# Patient Record
Sex: Female | Born: 1935 | Race: White | Hispanic: No | Smoking: Never smoker
Health system: Southern US, Community
[De-identification: ages and names within clinical notes are randomized; demographics above are authoritative.]

## PROBLEM LIST (undated history)

## (undated) DIAGNOSIS — C801 Malignant (primary) neoplasm, unspecified: Secondary | ICD-10-CM

## (undated) DIAGNOSIS — F0390 Unspecified dementia without behavioral disturbance: Secondary | ICD-10-CM

## (undated) DIAGNOSIS — M1990 Unspecified osteoarthritis, unspecified site: Secondary | ICD-10-CM

## (undated) DIAGNOSIS — K623 Rectal prolapse: Principal | ICD-10-CM

## (undated) HISTORY — PX: ABDOMINAL HYSTERECTOMY: SHX81

## (undated) HISTORY — PX: MASTECTOMY: SHX3

## (undated) HISTORY — DX: Unspecified osteoarthritis, unspecified site: M19.90

## (undated) HISTORY — PX: APPENDECTOMY: SHX54

## (undated) HISTORY — PX: OTHER SURGICAL HISTORY: SHX169

---

## 2006-11-15 ENCOUNTER — Encounter: Admission: RE | Admit: 2006-11-15 | Discharge: 2006-11-15 | Payer: Self-pay | Admitting: Internal Medicine

## 2006-12-24 ENCOUNTER — Encounter: Admission: RE | Admit: 2006-12-24 | Discharge: 2006-12-24 | Payer: Self-pay | Admitting: Internal Medicine

## 2007-01-14 ENCOUNTER — Encounter: Admission: RE | Admit: 2007-01-14 | Discharge: 2007-01-14 | Payer: Self-pay | Admitting: Internal Medicine

## 2008-02-27 ENCOUNTER — Encounter: Admission: RE | Admit: 2008-02-27 | Discharge: 2008-02-27 | Payer: Self-pay | Admitting: Internal Medicine

## 2009-02-27 ENCOUNTER — Encounter: Admission: RE | Admit: 2009-02-27 | Discharge: 2009-02-27 | Payer: Self-pay | Admitting: Internal Medicine

## 2009-03-14 ENCOUNTER — Encounter: Admission: RE | Admit: 2009-03-14 | Discharge: 2009-04-10 | Payer: Self-pay | Admitting: Internal Medicine

## 2010-02-28 ENCOUNTER — Encounter: Admission: RE | Admit: 2010-02-28 | Discharge: 2010-02-28 | Payer: Self-pay | Admitting: Internal Medicine

## 2010-07-20 ENCOUNTER — Encounter: Payer: Self-pay | Admitting: Internal Medicine

## 2011-03-24 ENCOUNTER — Other Ambulatory Visit: Payer: Self-pay | Admitting: *Deleted

## 2011-03-24 DIAGNOSIS — Z1231 Encounter for screening mammogram for malignant neoplasm of breast: Secondary | ICD-10-CM

## 2011-03-24 DIAGNOSIS — Z9010 Acquired absence of unspecified breast and nipple: Secondary | ICD-10-CM

## 2011-04-15 ENCOUNTER — Ambulatory Visit: Payer: Self-pay

## 2011-04-30 ENCOUNTER — Ambulatory Visit: Payer: Self-pay

## 2011-05-04 ENCOUNTER — Other Ambulatory Visit: Payer: Self-pay | Admitting: Internal Medicine

## 2011-05-04 ENCOUNTER — Ambulatory Visit
Admission: RE | Admit: 2011-05-04 | Discharge: 2011-05-04 | Disposition: A | Discharge status: Home / Self Care | Payer: Medicare Other | Source: Ambulatory Visit | Attending: *Deleted | Admitting: *Deleted

## 2011-05-04 DIAGNOSIS — Z9010 Acquired absence of unspecified breast and nipple: Secondary | ICD-10-CM

## 2011-05-04 DIAGNOSIS — Z1231 Encounter for screening mammogram for malignant neoplasm of breast: Secondary | ICD-10-CM

## 2011-06-24 ENCOUNTER — Ambulatory Visit (INDEPENDENT_AMBULATORY_CARE_PROVIDER_SITE_OTHER): Payer: Medicare Other

## 2011-06-24 DIAGNOSIS — K5900 Constipation, unspecified: Secondary | ICD-10-CM

## 2011-06-24 DIAGNOSIS — K625 Hemorrhage of anus and rectum: Secondary | ICD-10-CM

## 2011-06-24 DIAGNOSIS — K623 Rectal prolapse: Secondary | ICD-10-CM

## 2012-08-29 ENCOUNTER — Other Ambulatory Visit: Payer: Self-pay

## 2012-08-29 DIAGNOSIS — Z853 Personal history of malignant neoplasm of breast: Secondary | ICD-10-CM

## 2012-10-03 ENCOUNTER — Ambulatory Visit
Admission: RE | Admit: 2012-10-03 | Discharge: 2012-10-03 | Disposition: A | Discharge status: Home / Self Care | Payer: Medicare Other | Source: Ambulatory Visit

## 2012-10-03 DIAGNOSIS — Z9012 Acquired absence of left breast and nipple: Secondary | ICD-10-CM

## 2012-10-03 DIAGNOSIS — Z853 Personal history of malignant neoplasm of breast: Secondary | ICD-10-CM

## 2013-06-27 ENCOUNTER — Encounter (HOSPITAL_COMMUNITY): Payer: Self-pay | Admitting: Emergency Medicine

## 2013-06-27 ENCOUNTER — Inpatient Hospital Stay (HOSPITAL_COMMUNITY)
Admission: EM | Admit: 2013-06-27 | Discharge: 2013-07-09 | DRG: 331 | Disposition: A | Discharge status: Home Health | Payer: Medicare Other | Attending: Internal Medicine | Admitting: Internal Medicine

## 2013-06-27 ENCOUNTER — Observation Stay (HOSPITAL_COMMUNITY): Payer: Medicare Other

## 2013-06-27 DIAGNOSIS — I959 Hypotension, unspecified: Secondary | ICD-10-CM | POA: Diagnosis not present

## 2013-06-27 DIAGNOSIS — F3289 Other specified depressive episodes: Secondary | ICD-10-CM | POA: Diagnosis present

## 2013-06-27 DIAGNOSIS — K625 Hemorrhage of anus and rectum: Secondary | ICD-10-CM

## 2013-06-27 DIAGNOSIS — I469 Cardiac arrest, cause unspecified: Secondary | ICD-10-CM

## 2013-06-27 DIAGNOSIS — K5730 Diverticulosis of large intestine without perforation or abscess without bleeding: Secondary | ICD-10-CM | POA: Diagnosis present

## 2013-06-27 DIAGNOSIS — E785 Hyperlipidemia, unspecified: Secondary | ICD-10-CM

## 2013-06-27 DIAGNOSIS — D126 Benign neoplasm of colon, unspecified: Secondary | ICD-10-CM | POA: Diagnosis present

## 2013-06-27 DIAGNOSIS — I10 Essential (primary) hypertension: Secondary | ICD-10-CM | POA: Diagnosis present

## 2013-06-27 DIAGNOSIS — Z7982 Long term (current) use of aspirin: Secondary | ICD-10-CM

## 2013-06-27 DIAGNOSIS — K922 Gastrointestinal hemorrhage, unspecified: Secondary | ICD-10-CM

## 2013-06-27 DIAGNOSIS — D649 Anemia, unspecified: Secondary | ICD-10-CM | POA: Diagnosis present

## 2013-06-27 DIAGNOSIS — I498 Other specified cardiac arrhythmias: Secondary | ICD-10-CM | POA: Diagnosis present

## 2013-06-27 DIAGNOSIS — R55 Syncope and collapse: Secondary | ICD-10-CM

## 2013-06-27 DIAGNOSIS — F0390 Unspecified dementia without behavioral disturbance: Secondary | ICD-10-CM

## 2013-06-27 DIAGNOSIS — K429 Umbilical hernia without obstruction or gangrene: Secondary | ICD-10-CM | POA: Diagnosis present

## 2013-06-27 DIAGNOSIS — Z853 Personal history of malignant neoplasm of breast: Secondary | ICD-10-CM

## 2013-06-27 DIAGNOSIS — F411 Generalized anxiety disorder: Secondary | ICD-10-CM | POA: Diagnosis present

## 2013-06-27 DIAGNOSIS — Z79899 Other long term (current) drug therapy: Secondary | ICD-10-CM

## 2013-06-27 DIAGNOSIS — F329 Major depressive disorder, single episode, unspecified: Secondary | ICD-10-CM | POA: Diagnosis present

## 2013-06-27 DIAGNOSIS — Z8249 Family history of ischemic heart disease and other diseases of the circulatory system: Secondary | ICD-10-CM

## 2013-06-27 DIAGNOSIS — E039 Hypothyroidism, unspecified: Secondary | ICD-10-CM

## 2013-06-27 DIAGNOSIS — K623 Rectal prolapse: Principal | ICD-10-CM | POA: Diagnosis present

## 2013-06-27 DIAGNOSIS — G589 Mononeuropathy, unspecified: Secondary | ICD-10-CM | POA: Diagnosis present

## 2013-06-27 DIAGNOSIS — M792 Neuralgia and neuritis, unspecified: Secondary | ICD-10-CM | POA: Diagnosis present

## 2013-06-27 HISTORY — DX: Unspecified dementia, unspecified severity, without behavioral disturbance, psychotic disturbance, mood disturbance, and anxiety: F03.90

## 2013-06-27 HISTORY — DX: Rectal prolapse: K62.3

## 2013-06-27 HISTORY — DX: Malignant (primary) neoplasm, unspecified: C80.1

## 2013-06-27 LAB — CBC WITH DIFFERENTIAL/PLATELET
Basophils Absolute: 0 10*3/uL (ref 0.0–0.1)
Basophils Relative: 1 % (ref 0–1)
Eosinophils Absolute: 0.2 10*3/uL (ref 0.0–0.7)
Eosinophils Relative: 2 % (ref 0–5)
HCT: 34.7 % — ABNORMAL LOW (ref 36.0–46.0)
Hemoglobin: 11.9 g/dL — ABNORMAL LOW (ref 12.0–15.0)
Lymphocytes Relative: 37 % (ref 12–46)
Lymphs Abs: 3.3 10*3/uL (ref 0.7–4.0)
MCH: 31.3 pg (ref 26.0–34.0)
MCHC: 33.5 g/dL (ref 30.0–36.0)
MCV: 93.4 fL (ref 78.0–100.0)
Monocytes Absolute: 0.7 10*3/uL (ref 0.1–1.0)
Monocytes Relative: 5 % (ref 3–12)
Neutro Abs: 3.8 10*3/uL (ref 1.7–7.7)
Neutrophils Relative %: 51 % (ref 43–77)
Neutrophils Relative %: 49 % (ref 43–77)
Platelets: 235 10*3/uL (ref 150–400)
RBC: 3.61 MIL/uL — ABNORMAL LOW (ref 3.87–5.11)
RDW: 13.6 % (ref 11.5–15.5)
WBC: 7.4 10*3/uL (ref 4.0–10.5)
WBC: 7.6 10*3/uL (ref 4.0–10.5)

## 2013-06-27 LAB — COMPREHENSIVE METABOLIC PANEL WITH GFR
ALT: 19 U/L (ref 0–35)
AST: 19 U/L (ref 0–37)
Albumin: 3 g/dL — ABNORMAL LOW (ref 3.5–5.2)
Alkaline Phosphatase: 75 U/L (ref 39–117)
BUN: 15 mg/dL (ref 6–23)
CO2: 30 meq/L (ref 19–32)
Calcium: 8.6 mg/dL (ref 8.4–10.5)
Chloride: 98 meq/L (ref 96–112)
Creatinine, Ser: 0.8 mg/dL (ref 0.50–1.10)
GFR calc Af Amer: 75 mL/min — ABNORMAL LOW
GFR calc non Af Amer: 65 mL/min — ABNORMAL LOW
Glucose, Bld: 131 mg/dL — ABNORMAL HIGH (ref 70–99)
Potassium: 4.6 meq/L (ref 3.7–5.3)
Sodium: 137 meq/L (ref 137–147)
Total Bilirubin: <0.2 mg/dL — ABNORMAL LOW (ref 0.3–1.2)
Total Protein: 5.9 g/dL — ABNORMAL LOW (ref 6.0–8.3)

## 2013-06-27 LAB — LACTIC ACID, PLASMA: Lactic Acid, Venous: 1 mmol/L (ref 0.5–2.2)

## 2013-06-27 LAB — TYPE AND SCREEN
ABO/RH(D): A POS
Antibody Screen: NEGATIVE

## 2013-06-27 LAB — BASIC METABOLIC PANEL
BUN: 14 mg/dL (ref 6–23)
CO2: 30 mEq/L (ref 19–32)
Calcium: 8.4 mg/dL (ref 8.4–10.5)
Chloride: 98 mEq/L (ref 96–112)
Creatinine, Ser: 0.82 mg/dL (ref 0.50–1.10)
Creatinine, Ser: 0.8 mg/dL (ref 0.50–1.10)
GFR calc Af Amer: 75 mL/min — ABNORMAL LOW (ref >90)
GFR calc non Af Amer: 65 mL/min — ABNORMAL LOW (ref >90)
Glucose, Bld: 170 mg/dL — ABNORMAL HIGH (ref 70–99)
Potassium: 4.5 mEq/L (ref 3.7–5.3)

## 2013-06-27 LAB — OCCULT BLOOD, POC DEVICE: Fecal Occult Bld: POSITIVE — AB

## 2013-06-27 LAB — PHOSPHORUS: Phosphorus: 4.1 mg/dL (ref 2.3–4.6)

## 2013-06-27 LAB — CBC
HCT: 35.1 % — ABNORMAL LOW (ref 36.0–46.0)
Hemoglobin: 11.5 g/dL — ABNORMAL LOW (ref 12.0–15.0)
MCH: 30.6 pg (ref 26.0–34.0)
MCHC: 32.8 g/dL (ref 30.0–36.0)
MCV: 93.4 fL (ref 78.0–100.0)
Platelets: 251 10*3/uL (ref 150–400)
RDW: 13.6 % (ref 11.5–15.5)

## 2013-06-27 LAB — MAGNESIUM: Magnesium: 2 mg/dL (ref 1.5–2.5)

## 2013-06-27 LAB — APTT: aPTT: 32 seconds (ref 24–37)

## 2013-06-27 LAB — ABO/RH: ABO/RH(D): A POS

## 2013-06-27 LAB — PROTIME-INR: Prothrombin Time: 12.9 seconds (ref 11.6–15.2)

## 2013-06-27 MED ORDER — SODIUM CHLORIDE 0.9 % IV SOLN
INTRAVENOUS | Status: AC
  Administered 2013-06-27: 16:00:00 via INTRAVENOUS

## 2013-06-27 MED ORDER — OMEGA-3-ACID ETHYL ESTERS 1 G PO CAPS
1.0000 g | ORAL_CAPSULE | Freq: Every day | ORAL | Status: DC
  Administered 2013-06-28 – 2013-07-01 (×3): 1 g via ORAL
  Filled 2013-06-27 (×4): qty 1

## 2013-06-27 MED ORDER — PEG 3350-KCL-NA BICARB-NACL 420 G PO SOLR
4000.0000 mL | Freq: Once | ORAL | Status: AC
  Administered 2013-06-27: 4000 mL via ORAL
  Filled 2013-06-27: qty 4000

## 2013-06-27 MED ORDER — GLUCOSAMINE-CHONDROITIN 500-400 MG PO TABS: 1.0000 | ORAL_TABLET | Freq: Two times a day (BID) | ORAL | Status: DC

## 2013-06-27 MED ORDER — FISH OIL 1000 MG PO CAPS: 1.0000 | ORAL_CAPSULE | Freq: Every day | ORAL | Status: DC

## 2013-06-27 MED ORDER — SIMVASTATIN 10 MG PO TABS
10.0000 mg | ORAL_TABLET | Freq: Every day | ORAL | Status: DC
  Administered 2013-06-28 – 2013-07-01 (×3): 10 mg via ORAL
  Filled 2013-06-27 (×4): qty 1

## 2013-06-27 MED ORDER — CLONAZEPAM 0.5 MG PO TABS
0.5000 mg | ORAL_TABLET | Freq: Every day | ORAL | Status: DC
  Administered 2013-06-28 – 2013-07-01 (×3): 0.5 mg via ORAL
  Filled 2013-06-27 (×3): qty 1

## 2013-06-27 MED ORDER — DONEPEZIL HCL 10 MG PO TABS
10.0000 mg | ORAL_TABLET | Freq: Every day | ORAL | Status: DC
  Administered 2013-06-27 – 2013-06-30 (×4): 10 mg via ORAL
  Filled 2013-06-27 (×6): qty 1

## 2013-06-27 MED ORDER — PANTOPRAZOLE SODIUM 40 MG IV SOLR
40.0000 mg | Freq: Two times a day (BID) | INTRAVENOUS | Status: DC
  Administered 2013-06-27 – 2013-07-02 (×10): 40 mg via INTRAVENOUS
  Filled 2013-06-27 (×13): qty 40

## 2013-06-27 MED ORDER — CITALOPRAM HYDROBROMIDE 20 MG PO TABS
20.0000 mg | ORAL_TABLET | Freq: Every day | ORAL | Status: DC
  Administered 2013-06-27 – 2013-07-01 (×4): 20 mg via ORAL
  Filled 2013-06-27 (×5): qty 1

## 2013-06-27 MED ORDER — CALCIUM CARBONATE-VITAMIN D 500-200 MG-UNIT PO TABS
2.0000 | ORAL_TABLET | Freq: Every day | ORAL | Status: DC
  Administered 2013-06-28 – 2013-07-01 (×3): 2 via ORAL
  Filled 2013-06-27 (×4): qty 2

## 2013-06-27 MED ORDER — ACETAMINOPHEN 325 MG PO TABS: 650.0000 mg | ORAL_TABLET | Freq: Four times a day (QID) | ORAL | Status: DC | PRN

## 2013-06-27 MED ORDER — ONDANSETRON HCL 4 MG/2ML IJ SOLN
4.0000 mg | Freq: Four times a day (QID) | INTRAMUSCULAR | Status: DC | PRN
  Administered 2013-07-05: 4 mg via INTRAVENOUS
  Filled 2013-06-27: qty 2

## 2013-06-27 MED ORDER — MIRTAZAPINE 15 MG PO TABS
15.0000 mg | ORAL_TABLET | Freq: Every day | ORAL | Status: DC
  Administered 2013-06-27 – 2013-06-30 (×4): 15 mg via ORAL
  Filled 2013-06-27 (×6): qty 1

## 2013-06-27 MED ORDER — HYDROCODONE-ACETAMINOPHEN 5-325 MG PO TABS
1.0000 | ORAL_TABLET | ORAL | Status: DC | PRN
  Administered 2013-07-01 – 2013-07-03 (×3): 1 via ORAL
  Administered 2013-07-04: 2 via ORAL
  Administered 2013-07-06: 1 via ORAL
  Filled 2013-06-27 (×2): qty 1
  Filled 2013-06-27: qty 2
  Filled 2013-06-27 (×2): qty 1

## 2013-06-27 MED ORDER — PREGABALIN 50 MG PO CAPS
100.0000 mg | ORAL_CAPSULE | Freq: Every day | ORAL | Status: DC
  Administered 2013-06-27 – 2013-07-01 (×4): 100 mg via ORAL
  Filled 2013-06-27 (×3): qty 1
  Filled 2013-06-27: qty 2

## 2013-06-27 MED ORDER — ACETAMINOPHEN 650 MG RE SUPP: 650.0000 mg | Freq: Four times a day (QID) | RECTAL | Status: DC | PRN

## 2013-06-27 MED ORDER — LORATADINE 10 MG PO TABS
10.0000 mg | ORAL_TABLET | Freq: Every day | ORAL | Status: DC | PRN
  Filled 2013-06-27: qty 1

## 2013-06-27 MED ORDER — ONDANSETRON HCL 4 MG PO TABS: 4.0000 mg | ORAL_TABLET | Freq: Four times a day (QID) | ORAL | Status: DC | PRN

## 2013-06-27 MED ORDER — LEVOTHYROXINE SODIUM 75 MCG PO TABS
112.5000 ug | ORAL_TABLET | Freq: Every day | ORAL | Status: DC
  Administered 2013-06-28 – 2013-07-01 (×4): 112.5 ug via ORAL
  Filled 2013-06-27 (×5): qty 1.5

## 2013-06-27 MED ORDER — MORPHINE SULFATE 2 MG/ML IJ SOLN
1.0000 mg | INTRAMUSCULAR | Status: DC | PRN
  Administered 2013-06-30: 1 mg via INTRAVENOUS
  Filled 2013-06-27: qty 1

## 2013-06-27 MED ORDER — SODIUM CHLORIDE 0.9 % IV BOLUS (SEPSIS)
500.0000 mL | Freq: Once | INTRAVENOUS | Status: AC
  Administered 2013-06-27: 500 mL via INTRAVENOUS

## 2013-06-27 MED ORDER — SODIUM CHLORIDE 0.9 % IV SOLN
INTRAVENOUS | Status: DC
  Administered 2013-06-27 – 2013-06-29 (×6): via INTRAVENOUS

## 2013-06-27 NOTE — H&P (Addendum)
[facility] History and Physical  Erika Whitney WUJ:[id number] DOB: 1924/08/07 DOA: 06/27/2013  Referring physician: ER physician PCP: No primary provider on file.   Chief Complaint: blood in stool  HPI:  79 year old female with a past medical history of hypothyroidism, dyslipidemia, anxiety, rectal prolapse who presented to [facility] ED 06/27/2013 with complaints of blood in stool ongoing for past several weeks without associated lightheadedness or loss of consciousness. Pt did not have previous evaluation with colonoscopy. No associated abdominal pain, nausea or vomiting. She is on aspirin and naproxen. No fever or chills. No chest pain, shortness of breath or palpitations.  Pt was hemodynamically stable on the admission but had visible blood on rectal exam. Her BP was 113/48, HR 46-56, Tmax 98.9 F and O2 saturation of 96%. Hemoglobin was 11.9 on the admission and further blood work was unremarkable.   Assessment and Plan:  Principal Problem:   Lower GI bleed - painless, likely diverticular bleed - appreciate GI consult; evaluated by Dr. Elnoria Howard - plan for colonoscopy in am - hold aspirin and naproxen - continue protonix 40 mg IV Q 12 hours  Active Problems:   Dyslipidemia - continue statin therapy   Hypothyroidism - continue levothyroxine   Anxiety and depression - continue clonazepam and celexa   Dementia - continue aricept   Neuropathic pain - continue lyrica  Radiological Exams on Admission: No results found.   Code Status: Full Family Communication: Pt at bedside Disposition Plan: Admit for further evaluation  Manson Passey, MD  [facility] Pager [phone]  Review of Systems:  Constitutional: Negative for fever, chills and malaise/fatigue. Negative for diaphoresis.  HENT: Negative for hearing loss, ear pain, nosebleeds, congestion, sore throat, neck pain, tinnitus and ear discharge.   Eyes: Negative for blurred vision, double vision, photophobia, pain,  discharge and redness.  Respiratory: Negative for cough, hemoptysis, sputum production, shortness of breath, wheezing and stridor.   Cardiovascular: Negative for chest pain, palpitations, orthopnea, claudication and leg swelling.  Gastrointestinal: per HPI Genitourinary: Negative for dysuria, urgency, frequency, hematuria and flank pain.  Musculoskeletal: Negative for myalgias, back pain, joint pain and falls.  Skin: Negative for itching and rash.  Neurological: Negative for dizziness and weakness. Negative for tingling, tremors, sensory change, speech change, focal weakness, loss of consciousness and headaches.  Endo/Heme/Allergies: Negative for environmental allergies and polydipsia. Does not bruise/bleed easily.  Psychiatric/Behavioral: Negative for suicidal ideas. The patient is not nervous/anxious.      Past Medical History  Diagnosis Date  . Rectal prolapse   . Dementia    No past surgical history on file. Social History:  reports that she has never smoked. She does not have any smokeless tobacco history on file. She reports that she does not drink alcohol. Her drug history is not on file.  Allergies  Allergen Reactions  . Ibuprofen     Makes blood pressure go up    Family History: Family medical history significant for HTN, HLD   Prior to Admission medications   Medication Sig Start Date End Date Taking? Authorizing Provider  aspirin EC 81 MG tablet Take 81 mg by mouth daily.   Yes Historical Provider, MD  calcium-vitamin D (OSCAL WITH D) 500-200 MG-UNIT per tablet Take 2 tablets by mouth daily.   Yes Historical Provider, MD  citalopram (CELEXA) 20 MG tablet Take 20 mg by mouth daily.   Yes Historical Provider, MD  clonazePAM (KLONOPIN) 0.5 MG tablet Take 0.5 mg by mouth daily.   Yes Historical Provider,  MD  donepezil (ARICEPT) 10 MG tablet Take 10 mg by mouth at bedtime.   Yes Historical Provider, MD  glucosamine-chondroitin 500-400 MG tablet Take 1 tablet by mouth 2 (two)  times daily.   Yes Historical Provider, MD  levothyroxine (SYNTHROID, LEVOTHROID) 75 MCG tablet Take 112.5 mcg by mouth daily before breakfast. Takes one and one half tablets to = 112.5mg    Yes Historical Provider, MD  loratadine (CLARITIN) 10 MG tablet Take 10 mg by mouth daily as needed for allergies (allergies).   Yes Historical Provider, MD  mirtazapine (REMERON) 15 MG tablet Take 15 mg by mouth at bedtime.   Yes Historical Provider, MD  Multiple Vitamins-Minerals (MULTIVITAMIN PO) Take 1 tablet by mouth daily.   Yes Historical Provider, MD  naproxen sodium (ANAPROX) 220 MG tablet Take 220 mg by mouth 2 (two) times daily as needed (pain).   Yes Historical Provider, MD  Omega-3 Fatty Acids (FISH OIL) 1000 MG CAPS Take 1 capsule by mouth daily.   Yes Historical Provider, MD  pregabalin (LYRICA) 100 MG capsule Take 100 mg by mouth daily.   Yes Historical Provider, MD  simvastatin (ZOCOR) 10 MG tablet Take 10 mg by mouth daily.   Yes Historical Provider, MD   Physical Exam: Filed Vitals:   06/27/13 1330 06/27/13 1400 06/27/13 1430 06/27/13 1459  BP: 124/46 149/46 130/49   Pulse: 50 47 51   Temp:    98.9 F (37.2 C)  TempSrc:    Oral  Resp: 15 12 15    SpO2: 98% 96% 99%     Physical Exam  Constitutional: Appears well-developed and well-nourished. No distress.  HENT: Normocephalic. External right and left ear normal. Oropharynx is clear and moist.  Eyes: Conjunctivae and EOM are normal. PERRLA, no scleral icterus.  Neck: Normal ROM. Neck supple. No JVD. No tracheal deviation. No thyromegaly.  CVS: RRR, S1/S2 +, no murmurs, no gallops, no carotid bruit.  Pulmonary: Effort and breath sounds normal, no stridor, rhonchi, wheezes, rales.  Abdominal: Soft. BS +,  no distension, tenderness, rebound or guarding.  Musculoskeletal: Normal range of motion. No edema and no tenderness.  Lymphadenopathy: No lymphadenopathy noted, cervical, inguinal. Neuro: Alert. Normal reflexes, muscle tone  coordination. No cranial nerve deficit. Skin: Skin is warm and dry. No rash noted. Not diaphoretic. No erythema. No pallor.  Psychiatric: Normal mood and affect. Behavior, judgment, thought content normal.   Labs on Admission:  Basic Metabolic Panel:  Recent Labs Lab 06/27/13 1218  NA 137  K 4.5  CL 98  CO2 30  GLUCOSE 85  BUN 18  CREATININE 0.82  CALCIUM 8.7   Liver Function Tests: No results found for this basename: AST, ALT, ALKPHOS, BILITOT, PROT, ALBUMIN,  in the last 168 hours No results found for this basename: LIPASE, AMYLASE,  in the last 168 hours No results found for this basename: AMMONIA,  in the last 168 hours CBC:  Recent Labs Lab 06/27/13 1218  WBC 7.4  NEUTROABS 3.8  HGB 11.9*  HCT 34.7*  MCV 92.8  PLT 229   Cardiac Enzymes: No results found for this basename: CKTOTAL, CKMB, CKMBINDEX, TROPONINI,  in the last 168 hours BNP: No components found with this basename: POCBNP,  CBG: No results found for this basename: GLUCAP,  in the last 168 hours  If 7PM-7AM, please contact night-coverage [email or web address] Password [facility] 06/27/2013, 3:38 PM

## 2013-06-27 NOTE — Progress Notes (Addendum)
Atttempted to get report from ED x2.  No answer.

## 2013-06-27 NOTE — Progress Notes (Signed)
PHARMACIST - PHYSICIAN ORDER COMMUNICATION  CONCERNING: P&T Medication Policy on Herbal Medications  DESCRIPTION:  This patient's order for:  Glucosamine-chondroitin  has been noted.  This product(s) is classified as an "herbal" or natural product. Due to a lack of definitive safety studies or FDA approval, nonstandard manufacturing practices, plus the potential risk of unknown drug-drug interactions while on inpatient medications, the Pharmacy and Therapeutics Committee does not permit the use of "herbal" or natural products of this type within [facility].   ACTION TAKEN: The pharmacy department is unable to verify this order at this time and your patient has been informed of this safety policy. Please reevaluate patient's clinical condition at discharge and address if the herbal or natural product(s) should be resumed at that time.  Clance Boll, PharmD, BCPS Pager: [phone] 06/27/2013 5:05 PM

## 2013-06-27 NOTE — Consult Note (Signed)
Reason for Consult: Hematochezia Referring Physician: [facility]  Justine Null HPI: This is an 79 year old female with a PMH of rectal prolapse, minimal dementia, and history of breast cancer who is admitted for hematochezia.  The patient's bleeding started earlier today and she had a total of 3 bouts of bleeding witnessed by family at home and by the ER staff.  Last Thanksgiving she had an issue with rectal prolapse, but this resolved without any issue.  No further reports of prolapse over the past year.  She did have issues with hematochezia for the past several weeks, but she did not tell any of her family members.  This was subsequently discovered by her daughter, which prompted an outpatient consultation.  No complaints of any abdominal pain, nausea, vomiting, fever, and there is no known family history of colon cancer.  She has never had a colonoscopy.    Past Medical History  Diagnosis Date  . Rectal prolapse   . Dementia   . Cancer     BREAST    Past Surgical History  Procedure Laterality Date  . Abdominal hysterectomy    . Cesarean section    . Appendectomy    . Left mastectomy    . Mastectomy      History reviewed. No pertinent family history.  Social History:  reports that she has never smoked. She has never used smokeless tobacco. She reports that she does not drink alcohol or use illicit drugs.  Allergies:  Allergies  Allergen Reactions  . Ibuprofen     Makes blood pressure go up    Medications:  Scheduled: . sodium chloride   Intravenous STAT  . [START ON 06/28/2013] calcium-vitamin D  2 tablet Oral Daily  . citalopram  20 mg Oral Daily  . [START ON 06/28/2013] clonazePAM  0.5 mg Oral Daily  . donepezil  10 mg Oral QHS  . [START ON 06/28/2013] levothyroxine  112.5 mcg Oral QAC breakfast  . mirtazapine  15 mg Oral QHS  . [START ON 06/28/2013] omega-3 acid ethyl esters  1 g Oral Daily  . pantoprazole (PROTONIX) IV  40 mg Intravenous Q12H  .  pregabalin  100 mg Oral Daily  . [START ON 06/28/2013] simvastatin  10 mg Oral Daily   Continuous: . sodium chloride 75 mL/hr at 06/27/13 1723    Results for orders placed during the hospital encounter of 06/27/13 (from the past 24 hour(s))  ABO/RH     Status: None   Collection Time    06/27/13 11:55 AM      Result Value Range   ABO/RH(D) A POS    OCCULT BLOOD, POC DEVICE     Status: Abnormal   Collection Time    06/27/13 12:05 PM      Result Value Range   Fecal Occult Bld POSITIVE (*) NEGATIVE  LACTIC ACID, PLASMA     Status: None   Collection Time    06/27/13 12:17 PM      Result Value Range   Lactic Acid, Venous 1.0  0.5 - 2.2 mmol/L  TYPE AND SCREEN     Status: None   Collection Time    06/27/13 12:17 PM      Result Value Range   ABO/RH(D) A POS     Antibody Screen NEG     Sample Expiration 06/30/2013    CBC WITH DIFFERENTIAL     Status: Abnormal   Collection Time    06/27/13 12:18 PM  Result Value Range   WBC 7.4  4.0 - 10.5 K/uL   RBC 3.74 (*) 3.87 - 5.11 MIL/uL   Hemoglobin 11.9 (*) 12.0 - 15.0 g/dL   HCT 16.1 (*) 09.6 - 04.5 %   MCV 92.8  78.0 - 100.0 fL   MCH 31.8  26.0 - 34.0 pg   MCHC 34.3  30.0 - 36.0 g/dL   RDW 40.9  81.1 - 91.4 %   Platelets 229  150 - 400 K/uL   Neutrophils Relative % 51  43 - 77 %   Neutro Abs 3.8  1.7 - 7.7 K/uL   Lymphocytes Relative 37  12 - 46 %   Lymphs Abs 2.8  0.7 - 4.0 K/uL   Monocytes Relative 10  3 - 12 %   Monocytes Absolute 0.7  0.1 - 1.0 K/uL   Eosinophils Relative 2  0 - 5 %   Eosinophils Absolute 0.2  0.0 - 0.7 K/uL   Basophils Relative 1  0 - 1 %   Basophils Absolute 0.0  0.0 - 0.1 K/uL  BASIC METABOLIC PANEL     Status: Abnormal   Collection Time    06/27/13 12:18 PM      Result Value Range   Sodium 137  137 - 147 mEq/L   Potassium 4.5  3.7 - 5.3 mEq/L   Chloride 98  96 - 112 mEq/L   CO2 30  19 - 32 mEq/L   Glucose, Bld 85  70 - 99 mg/dL   BUN 18  6 - 23 mg/dL   Creatinine, Ser 7.82  0.50 - 1.10 mg/dL    Calcium 8.7  8.4 - 95.6 mg/dL   GFR calc non Af Amer 63 (*) >90 mL/min   GFR calc Af Amer 73 (*) >90 mL/min     No results found.  ROS:  As stated above in the HPI otherwise negative.  Blood pressure 163/84, pulse 53, temperature 97 F (36.1 C), temperature source Oral, resp. rate 20, height 5\' 1"  (1.549 m), weight 113 lb 5.1 oz (51.4 kg), SpO2 96.00%.    PE: Gen: NAD, Alert and Oriented HEENT:  [place]/AT, EOMI Neck: Supple, no LAD Lungs: CTA Bilaterally CV: RRR without M/G/R ABM: Soft, NTND, +BS Ext: No C/C/E,  Rectal: Fresh blood on the examination glove, no masses palpated  Assessment/Plan: 1) Hematochezia. 2) Mild anemia.   The patient's clinical history is consistent with a diverticular bleed.  I will perform further evaluation with a colonoscopy since she has never had the procedure in the past.  She is hemodynamically stable and she denies any problems with chest pain, SOB, or MI.  I was not able to elicit a rectal prolapse during my evaluation.  Plan: 1) Colonoscopy tomorrow AM. 2) Follow HGB and transfuse if necessary.  Lenyx Boody D 06/27/2013, 6:21 PM

## 2013-06-27 NOTE — ED Notes (Signed)
Bed: WA09 Expected date:  Expected time:  Means of arrival:  Comments: Rectal bleed

## 2013-06-27 NOTE — ED Notes (Signed)
Per EMS- pt from home with c/o of rectal bleeding from prolapse rectum. Denies pain.  100cc of bright red blood. 3 inches prolaspe

## 2013-06-27 NOTE — Progress Notes (Addendum)
Shift event: Around change of shift, RN paged this NP secondary to pt having a rectal mass ? Prolapse vs hemorrhoid. Pt here with GIB for colonoscopy tomorrow. NP to bedside. When NP arrived, pt sitting on commode and leaning over dry heaving in trash can. Straining. Brown stool noted at rectum. Rectal prolapse noted with brown stool and dark red blood in commode. Pt then suddenly slid off commode onto knees and help was called. Pt noted to be unresponsive. RN x 2 and this NP carried pt to bed. Pt without pulse. Code blue called. Compressions started immediately. After 30 seconds, pt responsive with pulse and breathing on her own. Code Blue cancelled. Rapid response at bedside. AED pads on pt but not used. Pt mental status back to baseline (hx dementia) immediately. Pt with pallor, responsive now, HR 46 sinus brady. RR normal. BP in 130s. CBG 157.  PCCM called and case info relayed. PCCM agreed that pt likely vasovagaled since event was witnessed and mental status quickly restored as well as pulse. Pt transferred to SDU for closer observation tonight. PCCM to see in consult. This NP called Dr. Hung, GI, and relayed info about rectal prolapse. GI advised reinsertion of rectum to correct prolapse and if any issues, call surgery. This NP asked Dr. Hijazi, of [facility], to accompany me to bedside for procedure. Rectal prolapse easily corrected by Dr. Hijazi.  Dr. Hung wanted rectal tube placement since pt is being prepped for colonoscopy in am.  Tests ordered: Troponin x 3, first one neg.  CBC with stable Hgb at 11.5 and mild leukocytosis. Get CXR. 12 lead EKG stat pending. BMP essentially normal. CBGs stable.  Will continue to follow closely tonight. If medically unstable, may need to postpone colonoscopy tomorrow. Will f/up recs by PCCM doc.  Family updated at bedside. Lemon Whitacre Kirby-Graham, NP [facility] Update: Pt resting comfortably. Skin pink now, warm and dry. Alert, responsive,  appropriate. EKG unremarkable except for sinus brady. CXR neg. VS stable, HR in the 50s now.  KJKG, NP Update: Last H/H 12.6/37.6. HR in the upper 50-60s now. Remains alert and appropriate. Conts to receive prep for colonoscopy with good rectal output.  KJKG, NP 

## 2013-06-27 NOTE — Progress Notes (Signed)
Nurse Practitioner Craige Cotta on-call notified of presumed rectal prolapse.  When nurse practitioner arrived patient was found lying on top of trash can not responding to verbal stimuli.  When patient was lifted from trash can, she was not visibly breathing.  Patient lifted and put onto bed, and was found to not have a pulse.  Code blue was called and chest compressions were immediately started.  After 30 seconds of chest compressions, patient was found to have a pulse, and AED pads were applied.  Patient was transferred to ICU, and report was given to [facility].  Philomena Doheny RN

## 2013-06-27 NOTE — ED Provider Notes (Signed)
CSN: [id number]     Arrival date & time 06/27/13  1130 History   First MD Initiated Contact with Patient 06/27/13 1130     Chief Complaint  Patient presents with  . Rectal Bleeding   (Consider location/radiation/quality/duration/timing/severity/associated sxs/prior Treatment) HPI Comments: 79 yo female with mild memory loss hx presents after large episode of lighter GI bleeding and possible prolapsed rectum.  Prolapse has reduced since.  Hx of similar.  Pt has GI visit planned this week with [facility].  No colonoscopy hx or diverticulitis hx.  No blood thinners except asa.  No afib or abd pain.  Mild general weakness.  Pt feels okay otherwise. One episode.  Pt had one in the past but not as much bleeding.   Patient is a 79 y.o. female presenting with hematochezia. The history is provided by the patient.  Rectal Bleeding Associated symptoms: no abdominal pain, no fever, no light-headedness and no vomiting     Past Medical History  Diagnosis Date  . Rectal prolapse    No past surgical history on file. No family history on file. History  Substance Use Topics  . Smoking status: Never Smoker   . Smokeless tobacco: Not on file  . Alcohol Use: No   OB History   Grav Para Term Preterm Abortions TAB SAB Ect Mult Living                 Review of Systems  Constitutional: Negative for fever and chills.  HENT: Negative for congestion.   Eyes: Negative for visual disturbance.  Respiratory: Negative for shortness of breath.   Cardiovascular: Negative for chest pain.  Gastrointestinal: Positive for blood in stool and hematochezia. Negative for vomiting and abdominal pain.  Genitourinary: Negative for dysuria and flank pain.  Musculoskeletal: Negative for back pain, neck pain and neck stiffness.  Skin: Negative for rash.  Neurological: Negative for light-headedness and headaches.    Allergies  Review of patient's allergies indicates not on file.  Home Medications  No current  outpatient prescriptions on file. There were no vitals taken for this visit. Physical Exam  Nursing note and vitals reviewed. Constitutional: She is oriented to person, place, and time. She appears well-developed and well-nourished.  HENT:  Head: Normocephalic and atraumatic.  Eyes: Conjunctivae are normal. Right eye exhibits no discharge. Left eye exhibits no discharge.  Neck: Normal range of motion. Neck supple. No tracheal deviation present.  Cardiovascular: Normal rate and regular rhythm.   Pulmonary/Chest: Effort normal and breath sounds normal.  Abdominal: Soft. She exhibits no distension. There is no tenderness. There is no guarding.  Genitourinary: Guaiac positive stool (gross lighter blood, no hemorrhoid or prolapse seen).  Musculoskeletal: She exhibits no edema.  Neurological: She is alert and oriented to person, place, and time.  Skin: Skin is warm. No rash noted.  Psychiatric: She has a normal mood and affect.    ED Course  Procedures (including critical care time) Labs Review Labs Reviewed  CBC WITH DIFFERENTIAL - Abnormal; Notable for the following:    RBC 3.74 (*)    Hemoglobin 11.9 (*)    HCT 34.7 (*)    All other components within normal limits  BASIC METABOLIC PANEL - Abnormal; Notable for the following:    GFR calc non Af Amer 63 (*)    GFR calc Af Amer 73 (*)    All other components within normal limits  OCCULT BLOOD, POC DEVICE - Abnormal; Notable for the following:    Fecal Occult Bld  POSITIVE (*)    All other components within normal limits  LACTIC ACID, PLASMA  TYPE AND SCREEN  ABO/RH   Imaging Review No results found.  EKG Interpretation   None       MDM   1. GI bleed   2. Dementia   3. Dyslipidemia   4. Hypothyroidism    GI bleed.  No prolapse seen, likely reduced. Other possibilities diverticular, inner hemorrhoid, angiodysplasia, other.   No bleeding in ED. Pt well appearing on recheck, no pain.  Discussed observation in hospital  with family/ [facility], accepted. Spoke with GI on call for consult.   Plan for labs, monitoring. Gross blood on exam.   The patients results and plan were reviewed and discussed.   Any x-rays performed were personally reviewed by myself.   Differential diagnosis were considered with the presenting HPI.   Admission/ observation were discussed with the admitting physician, patient and/or family and they are comfortable with the plan.    Enid Skeens, MD 06/27/13 [phone]

## 2013-06-27 NOTE — Progress Notes (Signed)
   CARE MANAGEMENT ED NOTE 06/27/2013  Patient:  Erika Whitney, Erika Whitney   Account Number:  [id number]  Date Initiated:  06/27/2013  Documentation initiated by:  Radford Pax  Subjective/Objective Assessment:   Patient presents to ED with rectal bleeding from prolapsed rectum.     Subjective/Objective Assessment Detail:     Action/Plan:   IV fluids and protonix given in ED   Action/Plan Detail:   Anticipated DC Date:       Status Recommendation to Physician:   Result of Recommendation:    Other ED Services  Consult Working Plan    DC Planning Services  Other  PCP issues    Choice offered to / List presented to:            Status of service:  Completed, signed off  ED Comments:   ED Comments Detail:  Patient confirms her pcp is Dr. Rosezetta Schlatter with [facility] in [place].  System updated.  Patient reports she lives at home lone.  Patient has a woman who comes to the house on Tues and Thurs from 9am to 12 noon to help her with her meals.  Patient reports she is able to [facility], dress, and feed herself without difficulty.  Patient does not have any medical equipment at home.  Patient stated, "I don't need it."  Patient's son [name] and daughter in Youth worker at bedside.  As per patient's son, patient's daughter [name] sees the patient every day and helps her with her medications. Patient's son reports that the patient has recently had some memory issues and has difficulty remebering what day of the week it is.

## 2013-06-28 ENCOUNTER — Encounter (HOSPITAL_COMMUNITY): Admission: EM | Disposition: A | Discharge status: Home Health | Payer: Self-pay | Source: Home / Self Care

## 2013-06-28 ENCOUNTER — Encounter (HOSPITAL_COMMUNITY): Payer: Self-pay

## 2013-06-28 DIAGNOSIS — F341 Dysthymic disorder: Secondary | ICD-10-CM

## 2013-06-28 DIAGNOSIS — K623 Rectal prolapse: Secondary | ICD-10-CM

## 2013-06-28 DIAGNOSIS — I519 Heart disease, unspecified: Secondary | ICD-10-CM

## 2013-06-28 DIAGNOSIS — I469 Cardiac arrest, cause unspecified: Secondary | ICD-10-CM

## 2013-06-28 DIAGNOSIS — K625 Hemorrhage of anus and rectum: Secondary | ICD-10-CM

## 2013-06-28 HISTORY — PX: COLONOSCOPY: SHX5424

## 2013-06-28 LAB — CBC
Platelets: 224 10*3/uL (ref 150–400)
RBC: 4.04 MIL/uL (ref 3.87–5.11)
RDW: 13.5 % (ref 11.5–15.5)
WBC: 9.4 10*3/uL (ref 4.0–10.5)

## 2013-06-28 LAB — COMPREHENSIVE METABOLIC PANEL
AST: 28 U/L (ref 0–37)
Alkaline Phosphatase: 84 U/L (ref 39–117)
BUN: 10 mg/dL (ref 6–23)
Calcium: 8.5 mg/dL (ref 8.4–10.5)
Chloride: 102 mEq/L (ref 96–112)
Creatinine, Ser: 0.68 mg/dL (ref 0.50–1.10)
GFR calc Af Amer: 89 mL/min — ABNORMAL LOW (ref >90)
GFR calc non Af Amer: 77 mL/min — ABNORMAL LOW (ref >90)
Glucose, Bld: 104 mg/dL — ABNORMAL HIGH (ref 70–99)
Potassium: 4.1 mEq/L (ref 3.7–5.3)
Sodium: 139 mEq/L (ref 137–147)
Total Bilirubin: 0.2 mg/dL — ABNORMAL LOW (ref 0.3–1.2)

## 2013-06-28 LAB — HEMOGLOBIN AND HEMATOCRIT, BLOOD: HCT: 37.6 % (ref 36.0–46.0)

## 2013-06-28 LAB — TROPONIN I: Troponin I: <0.3 ng/mL (ref <0.30)

## 2013-06-28 SURGERY — COLONOSCOPY
Anesthesia: Moderate Sedation

## 2013-06-28 MED ORDER — MIDAZOLAM HCL 10 MG/2ML IJ SOLN
INTRAMUSCULAR | Status: AC
  Filled 2013-06-28: qty 2

## 2013-06-28 MED ORDER — SODIUM CHLORIDE 0.9 % IV SOLN
INTRAVENOUS | Status: DC
  Administered 2013-06-28: 09:00:00 via INTRAVENOUS

## 2013-06-28 MED ORDER — HYDRALAZINE HCL 20 MG/ML IJ SOLN
10.0000 mg | INTRAMUSCULAR | Status: DC | PRN
  Administered 2013-06-28: 10 mg via INTRAVENOUS
  Filled 2013-06-28 (×2): qty 1

## 2013-06-28 MED ORDER — FENTANYL CITRATE 0.05 MG/ML IJ SOLN
INTRAMUSCULAR | Status: DC | PRN
  Administered 2013-06-28: 25 ug via INTRAVENOUS

## 2013-06-28 MED ORDER — MIDAZOLAM HCL 5 MG/5ML IJ SOLN
INTRAMUSCULAR | Status: DC | PRN
  Administered 2013-06-28 (×2): 2 mg via INTRAVENOUS

## 2013-06-28 MED ORDER — FENTANYL CITRATE 0.05 MG/ML IJ SOLN
INTRAMUSCULAR | Status: AC
  Filled 2013-06-28: qty 2

## 2013-06-28 NOTE — OR Nursing (Signed)
pts b/p 60/26 fluils NS wide open will continue to monitor.

## 2013-06-28 NOTE — Progress Notes (Addendum)
[facility] PROGRESS NOTE  Erika Whitney GEX:[id number] DOB: Nov 04, 1924 DOA: 06/27/2013 PCP: Delorse Lek, MD  Brief summary  79 year old female with a past medical history of hypothyroidism, dyslipidemia, anxiety, rectal prolapse who presented to [facility] ED 06/27/2013 with complaints of blood in stool ongoing for past several weeks without associated lightheadedness or loss of consciousness. Pt did not have previous evaluation with colonoscopy. No associated abdominal pain, nausea or vomiting. She is on aspirin and naproxen. No fever or chills. No chest pain, shortness of breath or palpitations.  Pt was hemodynamically stable on the admission but had visible blood on rectal exam.  She had rectal prolapse while sitting on toilet which likely led to vasovagal syncope and possible asystole vs. Severe bradycardia.  Chest compressions were done for 30 seconds with ROSC and spontaneous breathing.  She did not require intubation, shock, or administration of medications.  Colonoscopy on 12/31 confirmed rectal prolapse.   Assessment/Plan  Rectal prolapse with GI bleed. Appreciate gastroenterology assistance. Colonoscopy verified rectal prolapse on 12/31. -  Away general surgery recommendations -  Continue to hold aspirin and Naprosyn -  Continue IV Protonix -  Continue prn vicodin  Possible cardiac arrest likely due to severe vasovagal syncope and hypotension this morning during procedure -  Minimize medications that will slow AV conduction -  Agree that correcting rectal prolapse may prevent this from happening -  Troponins neg -  ECHo pending -  Cardiology consultation for pre-operative assessment for possible rectopexy on Friday  Elevated blood pressures -  Add prn hydralazine -  Avoid medications which can slow HR/AV node conduction  Dyslipidemia, stable - continue statin therapy  Hypothyroidism,  -  Check TSH   - continue levothyroxine  Anxiety and depression stable - continue  clonazepam and celexa  Dementia stable,  - continue aricept  Neuropathic pain stable - continue low dose lyrica  Diet:  NPO pending surgery evaluation Access:  PIV IVF:  yes Proph:  SCD  Code Status: Full Family Communication: Patient and her son Disposition Plan: Plan for long-term management or correction of rectal prolapse   Consultants:  Gastroenterology, Doctor Elnoria Howard  General surgery  Procedures:  Chest x-ray  Colonoscopy on 12/31  Antibiotics:  None   HPI/Subjective:  Patient states that she feels well. She does not remember the events that occurred overnight. She is aware that she has rectal prolapse on her colonoscopy and is awaiting general surgery to stop by.    She denies nausea, vomiting, diarrhea. She states she feels that she has to go to the bathroom currently.  Objective: Filed Vitals:   06/28/13 0830 06/28/13 0835 06/28/13 0840 06/28/13 0900  BP: 142/59 142/59 130/47 132/58  Pulse:    54  Temp:      TempSrc:      Resp: 60 15 14 16   Height:      Weight:      SpO2: 98% 97% 97% 99%    Intake/Output Summary (Last 24 hours) at 06/28/13 1139 Last data filed at 06/28/13 1100  Gross per 24 hour  Intake   4920 ml  Output   2901 ml  Net   2019 ml   Filed Weights   06/27/13 1711 06/28/13 0313  Weight: 51.4 kg (113 lb 5.1 oz) 52.7 kg (116 lb 2.9 oz)    Exam:   General:  Caucasian female, No acute distress  HEENT:  NCAT, MMM  Cardiovascular:  RRR, nl S1, S2 no mrg, 2+ pulses, warm extremities  Respiratory:  CTAB, no increased WOB  Abdomen:   NABS, soft, ND, mildly tender to palpation in the left lower cautery and and slightly medial to the left lower quadrant  MSK:   Normal tone and bulk, no LEE  Neuro:  Grossly intact  Data Reviewed: Basic Metabolic Panel:  Recent Labs Lab 06/27/13 1218 06/27/13 1740 06/27/13 1940 06/28/13 0345  NA 137 137 136* 139  K 4.5 4.6 4.3 4.1  CL 98 98 98 102  CO2 30 30 25 25   GLUCOSE 85 131* 170*  104*  BUN 18 15 14 10   CREATININE 0.82 0.80 0.80 0.68  CALCIUM 8.7 8.6 8.4 8.5  MG  --  2.0  --   --   PHOS  --  4.1  --   --    Liver Function Tests:  Recent Labs Lab 06/27/13 1740 06/28/13 0345  AST 19 28  ALT 19 21  ALKPHOS 75 84  BILITOT <0.2* 0.2*  PROT 5.9* 6.5  ALBUMIN 3.0* 3.2*   No results found for this basename: LIPASE, AMYLASE,  in the last 168 hours No results found for this basename: AMMONIA,  in the last 168 hours CBC:  Recent Labs Lab 06/27/13 1218 06/27/13 1740 06/27/13 1940 06/28/13 0110 06/28/13 0345  WBC 7.4 7.6 13.1*  --  9.4  NEUTROABS 3.8 3.7  --   --   --   HGB 11.9* 11.3* 11.5* 12.6 12.5  HCT 34.7* 33.7* 35.1* 37.6 37.8  MCV 92.8 93.4 93.4  --  93.6  PLT 229 235 251  --  224   Cardiac Enzymes:  Recent Labs Lab 06/27/13 1940 06/28/13 0110 06/28/13 0927  TROPONINI <0.30 <0.30 <0.30   BNP (last 3 results) No results found for this basename: PROBNP,  in the last 8760 hours CBG:  Recent Labs Lab 06/27/13 1937  GLUCAP 155*    Recent Results (from the past 240 hour(s))  MRSA PCR SCREENING     Status: None   Collection Time    06/27/13  8:17 PM      Result Value Range Status   MRSA by PCR NEGATIVE  NEGATIVE Final   Comment:            The GeneXpert MRSA Assay (FDA     approved for NASAL specimens     only), is one component of a     comprehensive MRSA colonization     surveillance program. It is not     intended to diagnose MRSA     infection nor to guide or     monitor treatment for     MRSA infections.     Studies: Dg Chest Port 1 View  06/27/2013   CLINICAL DATA:  Syncope.  Leukocytosis.  EXAM: PORTABLE CHEST - 1 VIEW  COMPARISON:  None.  FINDINGS: Normal heart size and pulmonary vascularity. Probable emphysematous changes within the lungs with peribronchial thickening and central interstitial changes consistent with chronic bronchitis. No focal airspace disease. No blunting of costophrenic angles. No pneumothorax.   IMPRESSION: No active disease.   Electronically Signed   By: Burman Nieves M.D.   On: 06/27/2013 21:51    Scheduled Meds: . calcium-vitamin D  2 tablet Oral Daily  . citalopram  20 mg Oral Daily  . clonazePAM  0.5 mg Oral Daily  . donepezil  10 mg Oral QHS  . levothyroxine  112.5 mcg Oral QAC breakfast  . mirtazapine  15 mg Oral QHS  . omega-3 acid ethyl  esters  1 g Oral Daily  . pantoprazole (PROTONIX) IV  40 mg Intravenous Q12H  . pregabalin  100 mg Oral Daily  . simvastatin  10 mg Oral Daily   Continuous Infusions: . sodium chloride 100 mL/hr at 06/27/13 2100  . sodium chloride 20 mL/hr at 06/28/13 0908    Principal Problem:   GI bleed Active Problems:   Rectal bleed   Dyslipidemia   Hypothyroidism   Anxiety and depression   Dementia   Neuropathic pain    Time spent: 30 min    Erika Whitney, [facility]  [facility] Pager [phone]. If 7PM-7AM, please contact night-coverage at [email or web address], password [facility] 06/28/2013, 11:39 AM  LOS: 1 day

## 2013-06-28 NOTE — Interval H&P Note (Signed)
History and Physical Interval Note:  06/28/2013 7:27 AM  Erika Whitney  has presented today for surgery, with the diagnosis of Hematochezia  The various methods of treatment have been discussed with the patient and family. After consideration of risks, benefits and other options for treatment, the patient has consented to  Procedure(s): COLONOSCOPY (N/A) as a surgical intervention .  The patient's history has been reviewed, patient examined, no change in status, stable for surgery.  I have reviewed the patient's chart and labs.  Questions were answered to the patient's satisfaction.     Rhianna Raulerson D

## 2013-06-28 NOTE — Evaluation (Signed)
Physical Therapy Evaluation Patient Details Name: Erika Whitney MRN: [id number] DOB: September 11, 1924 Today's Date: 06/28/2013 Time: 3664-4034 PT Time Calculation (min): 24 min  PT Assessment / Plan / Recommendation History of Present Illness  pt was admitted for GI Bleed. She is s/p colonoscopy  Clinical Impression  Pt tolerated ambulation well,  Slightly unsteady initially. Pt will benefit from PT to address problems. Will need to see how pt progresses to determine DC plan as pt lives alone.    PT Assessment  Patient needs continued PT services    Follow Up Recommendations  Home health PT;SNF (epends on  level of function and safety/ caregiver availabilty)    Does the patient have the potential to tolerate intense rehabilitation      Barriers to Discharge Decreased caregiver support      Equipment Recommendations   (tbd)    Recommendations for Other Services     Frequency Min 3X/week    Precautions / Restrictions Precautions Precautions: Fall Restrictions Weight Bearing Restrictions: No   Pertinent Vitals/Pain RN came into room and stated notification of V- tach HR 126. Pt's HR o/w in 50-60 range.      Mobility  Bed Mobility Bed Mobility: Supine to Sit Supine to Sit: 5: Supervision Transfers Transfers: Sit to Stand Sit to Stand: 4: Min guard;From bed;With upper extremity assist Stand to Sit: 5: Supervision;To chair/3-in-1;With upper extremity assist Details for Transfer Assistance: pt moves a bit slower first time standing up. Ambulation/Gait Ambulation/Gait Assistance: 4: Min assist Ambulation Distance (Feet): 15 Feet (then 60') Assistive device: 1 person hand held assist Ambulation/Gait Assistance Details: gait initially unsteady, improved with distance. Gait Pattern: Step-through pattern    Exercises     PT Diagnosis: Generalized weakness  PT Problem List: Decreased strength;Decreased activity tolerance;Decreased mobility PT Treatment Interventions: Gait  training;Functional mobility training;Stair training;Therapeutic activities;Therapeutic exercise;Patient/family education     PT Goals(Current goals can be found in the care plan section) Acute Rehab PT Goals Patient Stated Goal: to et up and walk. PT Goal Formulation: With patient/family Time For Goal Achievement: 07/12/13 Potential to Achieve Goals: Good  Visit Information  Last PT Received On: 06/28/13 Assistance Needed: +1 PT/OT/SLP Co-Evaluation/Treatment: Yes Reason for Co-Treatment: For patient/therapist safety PT goals addressed during session: Mobility/safety with mobility OT goals addressed during session: ADL's and self-care History of Present Illness: pt was admitted for GI Bleed. 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    Cognition  Cognition Arousal/Alertness: Awake/alert Behavior During Therapy: WFL for tasks assessed/performed Overall Cognitive Status: Within Functional Limits for tasks assessed    Extremity/Trunk Assessment Lower Extremity Assessment Lower Extremity Assessment: Generalized weakness Cervical / Trunk Assessment Cervical / Trunk Assessment: Normal   Balance Balance Balance Assessed: Yes  End of Session PT - End of Session Activity Tolerance: Treatment limited secondary to medical complications (Comment) (cut walker short due to HR 126/Vtach per RN notification.) Patient left: in chair;with call bell/phone within reach;with nursing/sitter in room Nurse Communication: Mobility status  GP     Rada Hay 06/28/2013,  2:04 PM Blanchard Kelch PT [phone]

## 2013-06-28 NOTE — Consult Note (Signed)
CARDIOLOGY CONSULT NOTE   Patient ID: Erika Whitney MRN: [id number], DOB/AGE: June 11, 1925   Admit date: 06/27/2013 Date of Consult: 06/28/2013   Primary Physician: Delorse Lek, MD Primary Cardiologist: none  Pt. Profile  PREOPERATIVE RISK ASSESSMENT   Problem List  Past Medical History  Diagnosis Date  . Rectal prolapse   . Dementia   . Cancer     BREAST    Past Surgical History  Procedure Laterality Date  . Abdominal hysterectomy    . Cesarean section    . Appendectomy    . Left mastectomy    . Mastectomy       Allergies  Allergies  Allergen Reactions  . Ibuprofen     Makes blood pressure go up    HPI   79 year old female with a h/o rectal prolapse, minimal dementia, and history of breacer who is admitted for hematochezia secondary to rectal prolapse. The patient's bleeding started earlier today and she had a total of 3 bouts of bleeding witnessed by family at home and by the ER staff. She was admitted and while being in the restroom with another bout of hematochezia she was found lying on top of trash can not responding to verbal stimuli. The patient's pulse  recovered after 30 seconds of chest compression and transferred to ICU. She underwent a colonoscopy yesterday. She is now scheduled a rectal surgery.  The patient states that prior to this admission she was very functional, doing all the house chores without any limitations, no DOE, no chest pain, palpitations or prior syncope. No prior h/o heart disease.    Inpatient Medications  . calcium-vitamin D  2 tablet Oral Daily  . citalopram  20 mg Oral Daily  . clonazePAM  0.5 mg Oral Daily  . donepezil  10 mg Oral QHS  . levothyroxine  112.5 mcg Oral QAC breakfast  . mirtazapine  15 mg Oral QHS  . omega-3 acid ethyl esters  1 g Oral Daily  . pantoprazole (PROTONIX) IV  40 mg Intravenous Q12H  . pregabalin  100 mg Oral Daily  . simvastatin  10 mg Oral Daily    Family History History reviewed.  No pertinent family history.   Social History History   Social History  . Marital Status: Widowed    Spouse Name: N/A    Number of Children: N/A  . Years of Education: N/A   Occupational History  . Not on file.   Social History Main Topics  . Smoking status: Never Smoker   . Smokeless tobacco: Never Used  . Alcohol Use: No  . Drug Use: No  . Sexual Activity: No   Other Topics Concern  . Not on file   Social History Narrative  . No narrative on file     Review of Systems  General:  No chills, fever, night sweats or weight changes.  Cardiovascular:  No chest pain, dyspnea on exertion, edema, orthopnea, palpitations, paroxysmal nocturnal dyspnea. Dermatological: No rash, lesions/masses Respiratory: No cough, dyspnea Urologic: No hematuria, dysuria Abdominal:   No nausea, vomiting, diarrhea, bright red blood per rectum, melena, or hematemesis Neurologic:  No visual changes, wkns, changes in mental status. All other systems reviewed and are otherwise negative except as noted above.  Physical Exam  Blood pressure 177/43, pulse 54, temperature 98.2 F (36.8 C), temperature source Oral, resp. rate 15, height 5\' 1"  (1.549 m), weight 116 lb 2.9 oz (52.7 kg), SpO2 93.00%.  General: Pleasant, NAD Psych: Normal affect. Neuro: Alert  and oriented X 3. Moves all extremities spontaneously. HEENT: Normal  Neck: Supple without bruits or JVD. Lungs:  Resp regular and unlabored, CTA. Heart: RRR no s3, s4, or murmurs. Abdomen: Soft, non-tender, non-distended, BS + x 4.  Extremities: No clubbing, cyanosis or edema. DP/PT/Radials 2+ and equal bilaterally.  Labs   Recent Labs  06/27/13 1940 06/28/13 0110 06/28/13 0927  TROPONINI <0.30 <0.30 <0.30   Lab Results  Component Value Date   WBC 9.4 06/28/2013   HGB 12.5 06/28/2013   HCT 37.8 06/28/2013   MCV 93.6 06/28/2013   PLT 224 06/28/2013    Recent Labs Lab 06/28/13 0345  NA 139  K 4.1  CL 102  CO2 25  BUN 10    CREATININE 0.68  CALCIUM 8.5  PROT 6.5  BILITOT 0.2*  ALKPHOS 84  ALT 21  AST 28  GLUCOSE 104*   No results found for this basename: CHOL, HDL, LDLCALC, TRIG   No results found for this basename: DDIMER   No components found with this basename: POCBNP,   Radiology/Studies  Dg Chest Port 1 View  06/27/2013   CLINICAL DATA:  Syncope.  Leukocytosis.  EXAM: PORTABLE CHEST - 1 VIEW  COMPARISON:  None.  FINDINGS: Normal heart size and pulmonary vascularity. Probable emphysematous changes within the lungs with peribronchial thickening and central interstitial changes consistent with chronic bronchitis. No focal airspace disease. No blunting of costophrenic angles. No pneumothorax.  IMPRESSION: No active disease.   Electronically Signed   By: Burman Nieves M.D.   On: 06/27/2013 21:51   echocardiogram  ECG  Sinus bradycardia, otherwise normal ECG   ASSESSMENT AND PLAN  A very pleasant 79 year old female admitted with hematochesia secondary to rectal prolapse scheduled for a corrective surgery. The patient has no prior cardiac history and was highly functional and asymptomatic prior to this admission.  The episode of lost consciousness yesterday was most probably a vasovagal syncope while straining combined with dehydration associated with acute blood loss. There is no evidence of cardiac arrest, lost pulse might have been just very weak pulse associated with hypotension. Cardiac enzymes are negative and ECG doesn't show any signs of prior infarct or ischemia.  We would recommend to replace fluid prior to the surgery to avoid further hypotension. We would follow echocardiogram results. If normal there should be no contraindication from cardiac standpoint to undergo abdominal/rectal surgery.  Thank you for the consultation, please call us with any questions.   Signed, Lars Masson, MD, [facility] 06/28/2013, 4:35 PM

## 2013-06-28 NOTE — Progress Notes (Signed)
CARE MANAGEMENT NOTE 06/28/2013  Patient:  KARSON, CHICAS   Account Number:  [id number]  Date Initiated:  06/28/2013  Documentation initiated by:  Ryane Konieczny  Subjective/Objective Assessment:   pt with gi bld and hypotension, iv fld support and EGD/poss syncopal episode at home     Action/Plan:   home when stable   Anticipated DC Date:  07/01/2013   Anticipated DC Plan:  HOME/SELF CARE  In-house referral  NA      DC Planning Services  NA      [facility] Choice  NA   Choice offered to / List presented to:  NA   DME arranged  NA      DME agency  NA     HH arranged  NA      HH agency  NA   Status of service:  In process, will continue to follow Medicare Important Message given?  NA - LOS <3 / Initial given by admissions (If response is "NO", the following Medicare IM given date fields will be blank) Date Medicare IM given:   Date Additional Medicare IM given:    Discharge Disposition:    Per UR Regulation:  Reviewed for med. necessity/level of care/duration of stay  If discussed at Long Length of Stay Meetings, dates discussed:    Comments:  [id number]/Demir Titsworth Stark Jock, BSN, [place] [phone] Chart Reviewed for discharge and hospital needs. Discharge needs at time of review:  None present will follow for needs. Review of patient progress due on [id number].

## 2013-06-28 NOTE — Progress Notes (Signed)
Pt taken for procedure. Called son and updates given.

## 2013-06-28 NOTE — Progress Notes (Signed)
Echocardiogram 2D Echocardiogram has been performed.  Mirtha Jain 06/28/2013, 4:35 PM

## 2013-06-28 NOTE — Op Note (Signed)
[facility] [address] [place], [address]   OPERATIVE PROCEDURE REPORT  PATIENT: Erika Whitney, Erika Whitney  MR#: [id number] BIRTHDATE: 1925-03-05  GENDER: Female ENDOSCOPIST: Jeani Hawking, MD ASSISTANT:   Joelyn Oms, OR Technician Anthony Sar, RN PROCEDURE DATE: 06/28/2013 PROCEDURE:   Colonoscopy with snare polypectomy ASA CLASS:   Class III INDICATIONS:Hematochezia. MEDICATIONS: Versed 4 mg IV and Fentanyl 25 mcg IV  DESCRIPTION OF PROCEDURE:   After the risks benefits and alternatives of the procedure were thoroughly explained, informed consent was obtained.  A digital rectal exam revealed no abnormalities of the rectum.    The     endoscope was introduced through the anus  and advanced to the cecum, which was identified by both the appendix and ileocecal valve , No adverse events experienced.    The quality of the prep was excellent. .  The instrument was then slowly withdrawn as the colon was fully examined.   FINDINGS: A 3 mm sessile cecal polyp was removed with a cold snare. Two 3-4 mm sessile transverse polyps were removed with a cold snare.  A 5 mm rectal polyp was removed with a hot snare.  Another 4 mm rectal polyp was removed with a cold snare.  Ten cm from the anal verge there was a clot.  This area was indicative of mucosal injury/ulceration from her rectal prolapse.  A large adherent clot was on top of the area and it was circumfirential.  No concerns for malignancy in this area with closer examination, however, the clot was not able to be removed.  Scattered left sided diverticla were found.          The scope was then withdrawn from the patient and the procedure terminated.  COMPLICATIONS: There were no complications.  IMPRESSION: 1) Rectal prolapse with resultant mucosal ulceration and bleeding. 2) Colonic polyps. 3) Diverticula.  RECOMMENDATIONS: 1) Surgical consultation for rectopexy. 2) Await polyp biopsy results, but with her age,  I do not recommend a repeat colonoscopy.  _______________________________ eSigned:  Jeani Hawking, MD 06/28/2013 8:09 AM

## 2013-06-28 NOTE — Consult Note (Signed)
Reason for Consult:rectal prolapse Referring Physician: Dr Patrick Jupiter  Erika Whitney is an 79 y.o. female.  HPI: patient reports long-standing constipation. Reports progressive rectal prolapse with bowel movements.  She has had one prolapse in the past that had to be reduced by urgent care. She was admitted with prolapse and bleeding.  Today revealed signs of rectal prolapse with no other masses noted within her colon.  She has had two episodes of hypotension requiring cardiovascular support during her hospitalization.   Past Medical History  Diagnosis Date  . Rectal prolapse   . Dementia   . Cancer     BREAST    Past Surgical History  Procedure Laterality Date  . Abdominal hysterectomy    . Cesarean section    . Appendectomy    . Left mastectomy    . Mastectomy      History reviewed. No pertinent family history.  Social History:  reports that she has never smoked. She has never used smokeless tobacco. She reports that she does not drink alcohol or use illicit drugs.  Allergies:  Allergies  Allergen Reactions  . Ibuprofen     Makes blood pressure go up    Medications: I have reviewed the patient's current medications.  Results for orders placed during the hospital encounter of 06/27/13 (from the past 48 hour(s))  ABO/RH     Status: None   Collection Time    06/27/13 11:55 AM      Result Value Range   ABO/RH(D) A POS    OCCULT BLOOD, POC DEVICE     Status: Abnormal   Collection Time    06/27/13 12:05 PM      Result Value Range   Fecal Occult Bld POSITIVE (*) NEGATIVE  LACTIC ACID, PLASMA     Status: None   Collection Time    06/27/13 12:17 PM      Result Value Range   Lactic Acid, Venous 1.0  0.5 - 2.2 mmol/L  TYPE AND SCREEN     Status: None   Collection Time    06/27/13 12:17 PM      Result Value Range   ABO/RH(D) A POS     Antibody Screen NEG     Sample Expiration 06/30/2013    CBC WITH DIFFERENTIAL     Status: Abnormal   Collection Time    06/27/13 12:18  PM      Result Value Range   WBC 7.4  4.0 - 10.5 K/uL   RBC 3.74 (*) 3.87 - 5.11 MIL/uL   Hemoglobin 11.9 (*) 12.0 - 15.0 g/dL   HCT 16.1 (*) 09.6 - 04.5 %   MCV 92.8  78.0 - 100.0 fL   MCH 31.8  26.0 - 34.0 pg   MCHC 34.3  30.0 - 36.0 g/dL   RDW 40.9  81.1 - 91.4 %   Platelets 229  150 - 400 K/uL   Neutrophils Relative % 51  43 - 77 %   Neutro Abs 3.8  1.7 - 7.7 K/uL   Lymphocytes Relative 37  12 - 46 %   Lymphs Abs 2.8  0.7 - 4.0 K/uL   Monocytes Relative 10  3 - 12 %   Monocytes Absolute 0.7  0.1 - 1.0 K/uL   Eosinophils Relative 2  0 - 5 %   Eosinophils Absolute 0.2  0.0 - 0.7 K/uL   Basophils Relative 1  0 - 1 %   Basophils Absolute 0.0  0.0 - 0.1 K/uL  BASIC  METABOLIC PANEL     Status: Abnormal   Collection Time    06/27/13 12:18 PM      Result Value Range   Sodium 137  137 - 147 mEq/L   Comment: Please note change in reference range.   Potassium 4.5  3.7 - 5.3 mEq/L   Comment: Please note change in reference range.   Chloride 98  96 - 112 mEq/L   CO2 30  19 - 32 mEq/L   Glucose, Bld 85  70 - 99 mg/dL   BUN 18  6 - 23 mg/dL   Creatinine, Ser 1.61  0.50 - 1.10 mg/dL   Calcium 8.7  8.4 - 09.6 mg/dL   GFR calc non Af Amer 63 (*) >90 mL/min   GFR calc Af Amer 73 (*) >90 mL/min   Comment: (NOTE)     The eGFR has been calculated using the CKD EPI equation.     This calculation has not been validated in all clinical situations.     eGFR's persistently <90 mL/min signify possible Chronic Kidney     Disease.  COMPREHENSIVE METABOLIC PANEL     Status: Abnormal   Collection Time    06/27/13  5:40 PM      Result Value Range   Sodium 137  137 - 147 mEq/L   Comment: Please note change in reference range.   Potassium 4.6  3.7 - 5.3 mEq/L   Comment: Please note change in reference range.   Chloride 98  96 - 112 mEq/L   CO2 30  19 - 32 mEq/L   Glucose, Bld 131 (*) 70 - 99 mg/dL   BUN 15  6 - 23 mg/dL   Creatinine, Ser 0.45  0.50 - 1.10 mg/dL   Calcium 8.6  8.4 - 40.9 mg/dL    Total Protein 5.9 (*) 6.0 - 8.3 g/dL   Albumin 3.0 (*) 3.5 - 5.2 g/dL   AST 19  0 - 37 U/L   ALT 19  0 - 35 U/L   Alkaline Phosphatase 75  39 - 117 U/L   Total Bilirubin <0.2 (*) 0.3 - 1.2 mg/dL   GFR calc non Af Amer 65 (*) >90 mL/min   GFR calc Af Amer 75 (*) >90 mL/min   Comment: (NOTE)     The eGFR has been calculated using the CKD EPI equation.     This calculation has not been validated in all clinical situations.     eGFR's persistently <90 mL/min signify possible Chronic Kidney     Disease.  MAGNESIUM     Status: None   Collection Time    06/27/13  5:40 PM      Result Value Range   Magnesium 2.0  1.5 - 2.5 mg/dL  PHOSPHORUS     Status: None   Collection Time    06/27/13  5:40 PM      Result Value Range   Phosphorus 4.1  2.3 - 4.6 mg/dL  CBC WITH DIFFERENTIAL     Status: Abnormal   Collection Time    06/27/13  5:40 PM      Result Value Range   WBC 7.6  4.0 - 10.5 K/uL   RBC 3.61 (*) 3.87 - 5.11 MIL/uL   Hemoglobin 11.3 (*) 12.0 - 15.0 g/dL   HCT 81.1 (*) 91.4 - 78.2 %   MCV 93.4  78.0 - 100.0 fL   MCH 31.3  26.0 - 34.0 pg   MCHC 33.5  30.0 - 36.0  g/dL   RDW 40.9  81.1 - 91.4 %   Platelets 235  150 - 400 K/uL   Neutrophils Relative % 49  43 - 77 %   Neutro Abs 3.7  1.7 - 7.7 K/uL   Lymphocytes Relative 44  12 - 46 %   Lymphs Abs 3.3  0.7 - 4.0 K/uL   Monocytes Relative 5  3 - 12 %   Monocytes Absolute 0.4  0.1 - 1.0 K/uL   Eosinophils Relative 2  0 - 5 %   Eosinophils Absolute 0.2  0.0 - 0.7 K/uL   Basophils Relative 1  0 - 1 %   Basophils Absolute 0.0  0.0 - 0.1 K/uL  APTT     Status: None   Collection Time    06/27/13  5:40 PM      Result Value Range   aPTT 32  24 - 37 seconds  PROTIME-INR     Status: None   Collection Time    06/27/13  5:40 PM      Result Value Range   Prothrombin Time 12.9  11.6 - 15.2 seconds   INR 0.99  0.00 - 1.49  GLUCOSE, CAPILLARY     Status: Abnormal   Collection Time    06/27/13  7:37 PM      Result Value Range    Glucose-Capillary 155 (*) 70 - 99 mg/dL   Comment 1 Notify RN    CBC     Status: Abnormal   Collection Time    06/27/13  7:40 PM      Result Value Range   WBC 13.1 (*) 4.0 - 10.5 K/uL   RBC 3.76 (*) 3.87 - 5.11 MIL/uL   Hemoglobin 11.5 (*) 12.0 - 15.0 g/dL   HCT 78.2 (*) 95.6 - 21.3 %   MCV 93.4  78.0 - 100.0 fL   MCH 30.6  26.0 - 34.0 pg   MCHC 32.8  30.0 - 36.0 g/dL   RDW 08.6  57.8 - 46.9 %   Platelets 251  150 - 400 K/uL  BASIC METABOLIC PANEL     Status: Abnormal   Collection Time    06/27/13  7:40 PM      Result Value Range   Sodium 136 (*) 137 - 147 mEq/L   Comment: Please note change in reference range.   Potassium 4.3  3.7 - 5.3 mEq/L   Comment: Please note change in reference range.   Chloride 98  96 - 112 mEq/L   CO2 25  19 - 32 mEq/L   Glucose, Bld 170 (*) 70 - 99 mg/dL   BUN 14  6 - 23 mg/dL   Creatinine, Ser 6.29  0.50 - 1.10 mg/dL   Calcium 8.4  8.4 - 52.8 mg/dL   GFR calc non Af Amer 65 (*) >90 mL/min   GFR calc Af Amer 75 (*) >90 mL/min   Comment: (NOTE)     The eGFR has been calculated using the CKD EPI equation.     This calculation has not been validated in all clinical situations.     eGFR's persistently <90 mL/min signify possible Chronic Kidney     Disease.  TROPONIN I     Status: None   Collection Time    06/27/13  7:40 PM      Result Value Range   Troponin I <0.30  <0.30 ng/mL   Comment:            Due to the release  kinetics of cTnI,     a negative result within the first hours     of the onset of symptoms does not rule out     myocardial infarction with certainty.     If myocardial infarction is still suspected,     repeat the test at appropriate intervals.  MRSA PCR SCREENING     Status: None   Collection Time    06/27/13  8:17 PM      Result Value Range   MRSA by PCR NEGATIVE  NEGATIVE   Comment:            The GeneXpert MRSA Assay (FDA     approved for NASAL specimens     only), is one component of a     comprehensive MRSA  colonization     surveillance program. It is not     intended to diagnose MRSA     infection nor to guide or     monitor treatment for     MRSA infections.  TROPONIN I     Status: None   Collection Time    06/28/13  1:10 AM      Result Value Range   Troponin I <0.30  <0.30 ng/mL   Comment:            Due to the release kinetics of cTnI,     a negative result within the first hours     of the onset of symptoms does not rule out     myocardial infarction with certainty.     If myocardial infarction is still suspected,     repeat the test at appropriate intervals.  HEMOGLOBIN AND HEMATOCRIT, BLOOD     Status: None   Collection Time    06/28/13  1:10 AM      Result Value Range   Hemoglobin 12.6  12.0 - 15.0 g/dL   HCT 46.9  62.9 - 52.8 %  COMPREHENSIVE METABOLIC PANEL     Status: Abnormal   Collection Time    06/28/13  3:45 AM      Result Value Range   Sodium 139  137 - 147 mEq/L   Comment: Please note change in reference range.   Potassium 4.1  3.7 - 5.3 mEq/L   Comment: Please note change in reference range.   Chloride 102  96 - 112 mEq/L   CO2 25  19 - 32 mEq/L   Glucose, Bld 104 (*) 70 - 99 mg/dL   BUN 10  6 - 23 mg/dL   Creatinine, Ser 4.13  0.50 - 1.10 mg/dL   Calcium 8.5  8.4 - 24.4 mg/dL   Total Protein 6.5  6.0 - 8.3 g/dL   Albumin 3.2 (*) 3.5 - 5.2 g/dL   AST 28  0 - 37 U/L   Comment: SLIGHT HEMOLYSIS   ALT 21  0 - 35 U/L   Alkaline Phosphatase 84  39 - 117 U/L   Total Bilirubin 0.2 (*) 0.3 - 1.2 mg/dL   GFR calc non Af Amer 77 (*) >90 mL/min   GFR calc Af Amer 89 (*) >90 mL/min   Comment: (NOTE)     The eGFR has been calculated using the CKD EPI equation.     This calculation has not been validated in all clinical situations.     eGFR's persistently <90 mL/min signify possible Chronic Kidney     Disease.  CBC     Status: None   Collection Time  06/28/13  3:45 AM      Result Value Range   WBC 9.4  4.0 - 10.5 K/uL   RBC 4.04  3.87 - 5.11 MIL/uL    Hemoglobin 12.5  12.0 - 15.0 g/dL   HCT 78.4  69.6 - 29.5 %   MCV 93.6  78.0 - 100.0 fL   MCH 30.9  26.0 - 34.0 pg   MCHC 33.1  30.0 - 36.0 g/dL   RDW 28.4  13.2 - 44.0 %   Platelets 224  150 - 400 K/uL  TROPONIN I     Status: None   Collection Time    06/28/13  9:27 AM      Result Value Range   Troponin I <0.30  <0.30 ng/mL   Comment:            Due to the release kinetics of cTnI,     a negative result within the first hours     of the onset of symptoms does not rule out     myocardial infarction with certainty.     If myocardial infarction is still suspected,     repeat the test at appropriate intervals.    Dg Chest Port 1 View  06/27/2013   CLINICAL DATA:  Syncope.  Leukocytosis.  EXAM: PORTABLE CHEST - 1 VIEW  COMPARISON:  None.  FINDINGS: Normal heart size and pulmonary vascularity. Probable emphysematous changes within the lungs with peribronchial thickening and central interstitial changes consistent with chronic bronchitis. No focal airspace disease. No blunting of costophrenic angles. No pneumothorax.  IMPRESSION: No active disease.   Electronically Signed   By: Burman Nieves M.D.   On: 06/27/2013 21:51    Review of Systems  Constitutional: Negative for fever and chills.  Respiratory: Negative for cough and shortness of breath.   Cardiovascular: Negative for chest pain.  Gastrointestinal: Positive for constipation and blood in stool. Negative for nausea, vomiting and abdominal pain.  Genitourinary: Negative for dysuria, urgency and frequency.  Skin: Negative for rash.  Neurological: Negative for headaches.   Blood pressure 177/43, pulse 54, temperature 97.3 F (36.3 C), temperature source Oral, resp. rate 15, height 5\' 1"  (1.549 m), weight 116 lb 2.9 oz (52.7 kg), SpO2 93.00%. Physical Exam  Constitutional: She is oriented to person, place, and time. She appears well-developed and well-nourished. No distress.  HENT:  Head: Normocephalic and atraumatic.  Eyes:  Conjunctivae are normal. Pupils are equal, round, and reactive to light.  Neck: Normal range of motion.  Cardiovascular: Normal rate and regular rhythm.   Respiratory: Breath sounds normal. No respiratory distress.  GI: Soft. She exhibits no distension. There is no tenderness.  Genitourinary:  No visible prolapse  Neurological: She is alert and oriented to person, place, and time.  Skin: Skin is warm and dry. She is not diaphoretic.    Assessment/Plan: Erika Whitney is a pleasant 79 y.o. F with rectal prolapse. This appears to be driven by constipation. I had a long discussion with her and her son this evening discussing the surgical options. Given her sons description of the prolapse, it sounds full thickness. We discussed rectopexy with sigmoidectomy versus rectopexy alone. We discussed that given her constipation rectopexy alone would probably only worse than this. We also discussed peroneal approaches which can sometimes be a better option in patients with medical comorbidities. She is going to be evaluated by cardiology for these episodes of hypotension that she has had in the hospital. I think it is reasonable to  proceed with a surgical procedure on Friday if it is felt that she does not need any further cardiac workup.  Donne Baley C. 06/28/2013, 7:13 PM

## 2013-06-28 NOTE — Progress Notes (Signed)
INITIAL NUTRITION ASSESSMENT  DOCUMENTATION CODES Per approved criteria  -Not Applicable   INTERVENTION: Diet advancement per MD Provide Resource Breeze BID if diet advanced to clear liquid Add Multivitamin with minerals when diet advanced RD to continue to monitor  NUTRITION DIAGNOSIS: Inadequate oral intake related to inability to eat/GI bleed as evidenced by NPO status.   Goal: Pt to meet >/= 90% of their estimated nutrition needs   Monitor:  Diet advancement PO intake Weight Labs  Reason for Assessment: Consult  79 y.o. female  Admitting Dx: GI bleed  ASSESSMENT: 79 year old female with a past medical history of hypothyroidism, dyslipidemia, anxiety, rectal prolapse who presented to [facility] ED 06/27/2013 with complaints of blood in stool ongoing for past several weeks without associated lightheadedness or loss of consciousness. Pt currently NPO. She reports having a good appetite and eating 3 good meals daily PTA. She reports losing weight several months ago due to poor po intake but, her weight has been trending up the past few months as she has been eating much better. Encouraged PO intake once diet is advanced.   Height: Ht Readings from Last 1 Encounters:  06/27/13 5\' 1"  (1.549 m)    Weight: Wt Readings from Last 1 Encounters:  06/28/13 116 lb 2.9 oz (52.7 kg)    Ideal Body Weight: 105 lbs  % Ideal Body Weight: 110%  Wt Readings from Last 10 Encounters:  06/28/13 116 lb 2.9 oz (52.7 kg)  06/28/13 116 lb 2.9 oz (52.7 kg)    Usual Body Weight: Varies per pt  % Usual Body Weight: NA  BMI:  Body mass index is 21.96 kg/(m^2).  Estimated Nutritional Needs: Kcal: 1250-1450 Protein: 55-65 grams Fluid: 1.5 L/day  Skin: intact  Diet Order: NPO  EDUCATION NEEDS: -No education needs identified at this time   Intake/Output Summary (Last 24 hours) at 06/28/13 1113 Last data filed at 06/28/13 1100  Gross per 24 hour  Intake   4920 ml  Output   2901 ml   Net   2019 ml    Last BM: 12/31   Labs:   Recent Labs Lab 06/27/13 1218 06/27/13 1740 06/27/13 1940 06/28/13 0345  NA 137 137 136* 139  K 4.5 4.6 4.3 4.1  CL 98 98 98 102  CO2 30 30 25 25   BUN 18 15 14 10   CREATININE 0.82 0.80 0.80 0.68  CALCIUM 8.7 8.6 8.4 8.5  MG  --  2.0  --   --   PHOS  --  4.1  --   --   GLUCOSE 85 131* 170* 104*    CBG (last 3)   Recent Labs  06/27/13 1937  GLUCAP 155*    Scheduled Meds: . calcium-vitamin D  2 tablet Oral Daily  . citalopram  20 mg Oral Daily  . clonazePAM  0.5 mg Oral Daily  . donepezil  10 mg Oral QHS  . levothyroxine  112.5 mcg Oral QAC breakfast  . mirtazapine  15 mg Oral QHS  . omega-3 acid ethyl esters  1 g Oral Daily  . pantoprazole (PROTONIX) IV  40 mg Intravenous Q12H  . pregabalin  100 mg Oral Daily  . simvastatin  10 mg Oral Daily    Continuous Infusions: . sodium chloride 100 mL/hr at 06/27/13 2100  . sodium chloride 20 mL/hr at 06/28/13 6045    Past Medical History  Diagnosis Date  . Rectal prolapse   . Dementia   . Cancer  BREAST    Past Surgical History  Procedure Laterality Date  . Abdominal hysterectomy    . Cesarean section    . Appendectomy    . Left mastectomy    . Mastectomy      Ian Malkin RD, LDN Inpatient Clinical Dietitian Pager: [phone] After Hours Pager: [phone]

## 2013-06-28 NOTE — Evaluation (Signed)
Occupational Therapy Evaluation Patient Details Name: Erika Whitney MRN: [id number] DOB: [birth year] Today's Date: 06/28/2013 Time: 4098-1191 OT Time Calculation (min): 24 min  OT Assessment / Plan / Recommendation History of present illness pt was admitted for GI Bleed. She is s/p colonoscopy   Clinical Impression   Pt was admitted for the above.  At baseline, she is independent with all adls/iadls but she doesn't drive.  She is overall min A currently.  Pt will benefit from skilled OT to increase independence with adls.  Goals in acute are for supervision level overall.      OT Assessment  Patient needs continued OT Services    Follow Up Recommendations  Home health OT;SNF (vs.  Pt lives alone)    Barriers to Discharge      Equipment Recommendations   (to be further assessed:  pt has none)    Recommendations for Other Services    Frequency  Min 2X/week    Precautions / Restrictions Precautions Precautions: Fall Restrictions Weight Bearing Restrictions: No   Pertinent Vitals/Pain HR 50s initially up to 126 and VTACH with ambulation.  Sats 99-100% on RA.  BP 163/45    ADL  Grooming: Brushing hair;Set up Where Assessed - Grooming: Unsupported sitting Upper Body Bathing: Set up Where Assessed - Upper Body Bathing: Unsupported sitting Lower Body Bathing: Minimal assistance Where Assessed - Lower Body Bathing: Supported sit to stand Upper Body Dressing: Minimal assistance (lines) Where Assessed - Upper Body Dressing: Unsupported sitting Lower Body Dressing: Minimal assistance Where Assessed - Lower Body Dressing: Supported sit to stand Toilet Transfer: Minimal assistance Toilet Transfer Method: Sit to Barista:  (bed to recliner) Toileting - Architect and Hygiene: Min guard Where Assessed - Toileting Clothing Manipulation and Hygiene: Sit to stand from 3-in-1 or toilet Transfers/Ambulation Related to ADLs: hand held assist for  ambulation ADL Comments: Pt very motivated.      OT Diagnosis: Generalized weakness  OT Problem List: Decreased strength;Decreased activity tolerance;Impaired balance (sitting and/or standing);Pain;Decreased knowledge of use of DME or AE OT Treatment Interventions: Self-care/ADL training;DME and/or AE instruction;Patient/family education;Balance training   OT Goals(Current goals can be found in the care plan section) Acute Rehab OT Goals Patient Stated Goal: to et up and walk. OT Goal Formulation: With patient Time For Goal Achievement: 07/12/13 Potential to Achieve Goals: Good ADL Goals Pt Will Perform Grooming: with supervision;standing Pt Will Perform Lower Body Bathing: with supervision;sit to/from stand Pt Will Perform Lower Body Dressing: with supervision;sit to/from stand Pt Will Transfer to Toilet: with supervision;ambulating;regular height toilet Pt Will Perform Toileting - Clothing Manipulation and hygiene: with supervision;sit to/from stand Pt Will Perform Tub/Shower Transfer: with min guard assist;shower seat;tub bench;ambulating;Tub transfer  Visit Information  Last OT Received On: 06/28/13 Assistance Needed: +1 PT/OT/SLP Co-Evaluation/Treatment: Yes Reason for Co-Treatment: For patient/therapist safety PT goals addressed during session: Mobility/safety with mobility OT goals addressed during session: ADL's and self-care History of Present Illness: pt was admitted for GI Bleed. She is s/p colonoscopy       Prior Functioning     Home Living Family/patient expects to be discharged to:: Private residence Living Arrangements: Alone Type of Home: House Home Access: Stairs to enter [employer] of Steps: 4 wide steps Entrance Stairs-Rails: Right;Left Home Layout: One level Home Equipment: None Prior Function Level of Independence: Independent Comments: someone gets groceries Communication Communication: No difficulties         Vision/Perception      Cognition  Cognition Arousal/Alertness: Awake/alert  Behavior During Therapy: WFL for tasks assessed/performed Overall Cognitive Status: Within Functional Limits for tasks assessed    Extremity/Trunk Assessment Upper Extremity Assessment Upper Extremity Assessment: Overall WFL for tasks assessed Lower Extremity Assessment Lower Extremity Assessment: Generalized weakness Cervical / Trunk Assessment Cervical / Trunk Assessment: Normal     Mobility Bed Mobility Bed Mobility: Supine to Sit Supine to Sit: 5: Supervision Transfers Sit to Stand: 4: Min guard;From bed;With upper extremity assist Stand to Sit: 5: Supervision;To chair/3-in-1;With upper extremity assist Details for Transfer Assistance: pt moves a bit slower first time standing up.     Exercise     Balance Balance Balance Assessed: Yes Static Standing Balance Static Standing - Balance Support: No upper extremity supported Static Standing - Level of Assistance: 5: Stand by assistance Dynamic Standing Balance Dynamic Standing - Balance Support: Right upper extremity supported Dynamic Standing - Level of Assistance: 4: Min assist Dynamic Standing - Comments: ambulation with hand held assist   End of Session OT - End of Session Activity Tolerance: Patient tolerated treatment well Patient left: in chair;with call bell/phone within reach;with family/visitor present  GO     Tine Mabee 06/28/2013, 2:08 PM Marica Otter, OTR/L [phone] 06/28/2013

## 2013-06-28 NOTE — H&P (View-Only) (Signed)
Shift event: Around change of shift, RN paged this NP secondary to pt having a rectal mass ? Prolapse vs hemorrhoid. Pt here with GIB for colonoscopy tomorrow. NP to bedside. When NP arrived, pt sitting on commode and leaning over dry heaving in trash can. Straining. Brown stool noted at rectum. Rectal prolapse noted with brown stool and dark red blood in commode. Pt then suddenly slid off commode onto knees and help was called. Pt noted to be unresponsive. RN x 2 and this NP carried pt to bed. Pt without pulse. Code blue called. Compressions started immediately. After 30 seconds, pt responsive with pulse and breathing on her own. Code Blue cancelled. Rapid response at bedside. AED pads on pt but not used. Pt mental status back to baseline (hx dementia) immediately. Pt with pallor, responsive now, HR 46 sinus brady. RR normal. BP in 130s. CBG 157.  PCCM called and case info relayed. PCCM agreed that pt likely vasovagaled since event was witnessed and mental status quickly restored as well as pulse. Pt transferred to SDU for closer observation tonight. PCCM to see in consult. This NP called Dr. Elnoria Howard, GI, and relayed info about rectal prolapse. GI advised reinsertion of rectum to correct prolapse and if any issues, call surgery. This NP asked Dr. Sharyon Medicus, of [facility], to accompany me to bedside for procedure. Rectal prolapse easily corrected by Dr. Sharyon Medicus.  Dr. Elnoria Howard wanted rectal tube placement since pt is being prepped for colonoscopy in am.  Tests ordered: Troponin x 3, first one neg.  CBC with stable Hgb at 11.5 and mild leukocytosis. Get CXR. 12 lead EKG stat pending. BMP essentially normal. CBGs stable.  Will continue to follow closely tonight. If medically unstable, may need to postpone colonoscopy tomorrow. Will f/up recs by PCCM doc.  Family updated at bedside. Jimmye Norman, NP [facility] Update: Pt resting comfortably. Skin pink now, warm and dry. Alert, responsive,  appropriate. EKG unremarkable except for sinus brady. CXR neg. VS stable, HR in the 50s now.  KJKG, NP Update: Last H/H 12.6/37.6. HR in the upper 50-60s now. Remains alert and appropriate. Conts to receive prep for colonoscopy with good rectal output.  KJKG, NP

## 2013-06-29 DIAGNOSIS — K623 Rectal prolapse: Principal | ICD-10-CM

## 2013-06-29 DIAGNOSIS — R55 Syncope and collapse: Secondary | ICD-10-CM

## 2013-06-29 LAB — BASIC METABOLIC PANEL
BUN: 7 mg/dL (ref 6–23)
CO2: 28 mEq/L (ref 19–32)
Calcium: 8.7 mg/dL (ref 8.4–10.5)
Chloride: 103 mEq/L (ref 96–112)
Creatinine, Ser: 0.8 mg/dL (ref 0.50–1.10)
GFR calc Af Amer: 75 mL/min — ABNORMAL LOW (ref >90)
GFR calc non Af Amer: 65 mL/min — ABNORMAL LOW (ref >90)
Glucose, Bld: 100 mg/dL — ABNORMAL HIGH (ref 70–99)
Potassium: 3.7 mEq/L (ref 3.7–5.3)
Sodium: 139 mEq/L (ref 137–147)

## 2013-06-29 LAB — CBC
HCT: 33.1 % — ABNORMAL LOW (ref 36.0–46.0)
Hemoglobin: 11 g/dL — ABNORMAL LOW (ref 12.0–15.0)
MCH: 30.7 pg (ref 26.0–34.0)
MCHC: 33.2 g/dL (ref 30.0–36.0)
MCV: 92.5 fL (ref 78.0–100.0)
Platelets: 209 10*3/uL (ref 150–400)
RBC: 3.58 MIL/uL — ABNORMAL LOW (ref 3.87–5.11)
RDW: 13.6 % (ref 11.5–15.5)
WBC: 8.3 10*3/uL (ref 4.0–10.5)

## 2013-06-29 LAB — GLUCOSE, CAPILLARY: GLUCOSE-CAPILLARY: 94 mg/dL (ref 70–99)

## 2013-06-29 LAB — TSH: TSH: 1.828 u[IU]/mL (ref 0.350–4.500)

## 2013-06-29 MED ORDER — POLYETHYLENE GLYCOL 3350 17 GM/SCOOP PO POWD
1.0000 | Freq: Once | ORAL | Status: DC
  Filled 2013-06-29 (×2): qty 255

## 2013-06-29 MED ORDER — METRONIDAZOLE 500 MG PO TABS
1000.0000 mg | ORAL_TABLET | ORAL | Status: AC
  Administered 2013-06-29 (×3): 1000 mg via ORAL
  Filled 2013-06-29 (×3): qty 2

## 2013-06-29 MED ORDER — METRONIDAZOLE 500 MG PO TABS: 500.0000 mg | ORAL_TABLET | ORAL | Status: DC

## 2013-06-29 MED ORDER — BISACODYL 5 MG PO TBEC
10.0000 mg | DELAYED_RELEASE_TABLET | Freq: Once | ORAL | Status: AC
  Administered 2013-06-29: 10 mg via ORAL
  Filled 2013-06-29: qty 2

## 2013-06-29 MED ORDER — NEOMYCIN SULFATE 500 MG PO TABS
1000.0000 mg | ORAL_TABLET | ORAL | Status: AC
  Administered 2013-06-29 (×3): 1000 mg via ORAL
  Filled 2013-06-29 (×3): qty 2

## 2013-06-29 NOTE — Progress Notes (Signed)
Patient Name: Erika Whitney Date of Encounter: 06/29/2013  Principal Problem:   GI bleed Active Problems:   Rectal bleed   Dyslipidemia   Hypothyroidism   Anxiety and depression   Dementia   Neuropathic pain   Rectal prolapse   Cardiac arrest   Vasovagal syncope   Length of Stay: 2  SUBJECTIVE  The patient feels well today, no bleeding, palpitations, lightheadedness.   CURRENT MEDS . bisacodyl  10 mg Oral Once  . calcium-vitamin D  2 tablet Oral Daily  . citalopram  20 mg Oral Daily  . clonazePAM  0.5 mg Oral Daily  . donepezil  10 mg Oral QHS  . levothyroxine  112.5 mcg Oral QAC breakfast  . mirtazapine  15 mg Oral QHS  . omega-3 acid ethyl esters  1 g Oral Daily  . pantoprazole (PROTONIX) IV  40 mg Intravenous Q12H  . polyethylene glycol powder  1 Container Oral Once  . pregabalin  100 mg Oral Daily  . simvastatin  10 mg Oral Daily    OBJECTIVE  Filed Vitals:   06/29/13 0416 06/29/13 0500 06/29/13 0600 06/29/13 0800  BP:    167/46  Pulse:  54 55 61  Temp: 97.7 F (36.5 C)   98.1 F (36.7 C)  TempSrc: Oral   Oral  Resp:  19 15 15   Height:      Weight: 111 lb 15.9 oz (50.8 kg)     SpO2:  95% 97% 98%    Intake/Output Summary (Last 24 hours) at 06/29/13 0837 Last data filed at 06/29/13 0700  Gross per 24 hour  Intake   1400 ml  Output   3700 ml  Net  -2300 ml   Filed Weights   06/27/13 1711 06/28/13 0313 06/29/13 0416  Weight: 113 lb 5.1 oz (51.4 kg) 116 lb 2.9 oz (52.7 kg) 111 lb 15.9 oz (50.8 kg)    PHYSICAL EXAM  General: Pleasant, NAD. Neuro: Alert and oriented X 3. Moves all extremities spontaneously. Psych: Normal affect. HEENT:  Normal  Neck: Supple without bruits or JVD. Lungs:  Resp regular and unlabored, CTA. Heart: RRR no s3, s4, or murmurs. Abdomen: Soft, non-tender, non-distended, BS + x 4.  Extremities: No clubbing, cyanosis or edema. DP/PT/Radials 2+ and equal bilaterally.  Accessory Clinical Findings  CBC  Recent  Labs  06/27/13 1218 06/27/13 1740  06/28/13 0345 06/29/13 0330  WBC 7.4 7.6  < > 9.4 8.3  NEUTROABS 3.8 3.7  --   --   --   HGB 11.9* 11.3*  < > 12.5 11.0*  HCT 34.7* 33.7*  < > 37.8 33.1*  MCV 92.8 93.4  < > 93.6 92.5  PLT 229 235  < > 224 209  < > = values in this interval not displayed. Basic Metabolic Panel  Recent Labs  06/27/13 1218 06/27/13 1740  06/28/13 0345 06/29/13 0330  NA 137 137  < > 139 139  K 4.5 4.6  < > 4.1 3.7  CL 98 98  < > 102 103  CO2 30 30  < > 25 28  GLUCOSE 85 131*  < > 104* 100*  BUN 18 15  < > 10 7  CREATININE 0.82 0.80  < > 0.68 0.80  CALCIUM 8.7 8.6  < > 8.5 8.7  MG  --  2.0  --   --   --   PHOS  --  4.1  --   --   --   < > =  values in this interval not displayed. Liver Function Tests  Recent Labs  06/27/13 1740 06/28/13 0345  AST 19 28  ALT 19 21  ALKPHOS 75 84  BILITOT <0.2* 0.2*  PROT 5.9* 6.5  ALBUMIN 3.0* 3.2*   No results found for this basename: LIPASE, AMYLASE,  in the last 72 hours Cardiac Enzymes  Recent Labs  06/27/13 1940 06/28/13 0110 06/28/13 0927  TROPONINI <0.30 <0.30 <0.30    Radiology/Studies  Dg Chest Port 1 View  06/27/2013   CLINICAL DATA:  Syncope.  Leukocytosis.  EXAM: PORTABLE CHEST - 1 VIEW  COMPARISON:  None.  FINDINGS: Normal heart size and pulmonary vascularity. Probable emphysematous changes within the lungs with peribronchial thickening and central interstitial changes consistent with chronic bronchitis. No focal airspace disease. No blunting of costophrenic angles. No pneumothorax.  IMPRESSION: No active disease.   Electronically Signed   By: Lucienne Capers M.D.   On: 06/27/2013 21:51   Echocardiogram 06/28/2013 Study Conclusions  - Left ventricle: The cavity size was normal. Wall thickness was normal. Systolic function was normal. The estimated ejection fraction was in the range of 60% to 65%. Wall motion was normal; there were no regional wall motion abnormalities. Doppler parameters  are consistent with abnormal left ventricular relaxation (grade 1 diastolic dysfunction). - Aortic valve: Poorly visualized. There was no stenosis. - Mitral valve: No significant regurgitation. - Left atrium: The atrium was mildly dilated. - Right ventricle: The cavity size was normal. Systolic function was normal. - Tricuspid valve: Peak RV-RA gradient: 44mm Hg (S). - Pulmonary arteries: PA peak pressure: 16mm Hg (S). - Inferior vena cava: The vessel was normal in size; the respirophasic diameter changes were in the normal range (= 50%); findings are consistent with normal central venous pressure. Impressions:  - Normal LV size and systolic function, EF 01-60%. Normal RV size and systolic function. Borderline pulmonary hypertension. No significant valvular abnormalities.   TELE: telemetry reviewed, the patient is in normal SR, no arrthythmias    ASSESSMENT AND PLAN  A very pleasant 80 year old female admitted with hematochesia secondary to rectal prolapse scheduled for a corrective surgery.  The patient has no prior cardiac history and was highly functional and asymptomatic prior to this admission.  The episode of lost consciousness yesterday was most probably a vasovagal syncope while straining combined with dehydration associated with acute blood loss. There is no evidence of cardiac arrest, lost pulse might have been just very weak pulse associated with hypotension.  Cardiac enzymes are negative and ECG doesn't show any signs of prior infarct or ischemia.  We would recommend to replace fluid prior to the surgery to avoid further hypotension.   There are no arrhythmias on telemetry. Echocardiogram shows normal biventricular function and no wall motion abnormalities.   There is no contraindication from cardiac standpoint for Mrs Bartl to undergo abdominal/rectal surgery.  We will sign off, if you have any questions, please call me at [phone].  Signed, Ena Dawley, H MD, [facility] 06/29/2013

## 2013-06-29 NOTE — Progress Notes (Signed)
[facility] PROGRESS NOTE  Erika Whitney FGH:[id number] DOB: 1925/06/17 DOA: 06/27/2013 PCP: Stephens Shire, MD  Brief summary  80 year old female with a past medical history of hypothyroidism, dyslipidemia, anxiety, rectal prolapse who presented to [facility] ED 06/27/2013 with complaints of blood in stool ongoing for past several weeks without associated lightheadedness or loss of consciousness. Pt did not have previous evaluation with colonoscopy. No associated abdominal pain, nausea or vomiting. She is on aspirin and naproxen. No fever or chills. No chest pain, shortness of breath or palpitations.  Pt was hemodynamically stable on the admission but had visible blood on rectal exam.  She had rectal prolapse while sitting on toilet which likely led to vasovagal syncope and possible asystole vs. Severe bradycardia.  Chest compressions were done for 30 seconds with ROSC and spontaneous breathing.  She did not require intubation, shock, or administration of medications.  Colonoscopy on 12/31 confirmed rectal prolapse.   Assessment/Plan  Rectal prolapse with GI bleed. Appreciate gastroenterology assistance. Colonoscopy verified rectal prolapse on 12/31. -  Appreciate general surgery recommendations -  Anticipate surgery with resection and rectopexy tomorrow morning -  Continue to hold aspirin and Naprosyn -  Continue IV Protonix -  Continue prn vicodin  Severe vasovagal syncope and hypotension due to rectal prolapse -  Minimize medications that will slow AV conduction -  Troponins neg -  Telemetry: Sinus rhythm -  ECHo demonstrates preserved ejection fraction without wall motion abnormalities, grade 1 diastolic dysfunction -  Cardiology has cleared for surgery on Friday  Elevated blood pressures -  Add prn hydralazine -  Avoid medications which can slow HR/AV node conduction  Dyslipidemia, stable - continue statin therapy  Hypothyroidism,  -   TSH 1.828, at goal - continue  levothyroxine  Anxiety and depression stable - continue clonazepam and celexa  Dementia stable,  - continue aricept  Neuropathic pain stable - continue low dose lyrica  Diet:  Clear liquid diet then n.p.o. at midnight Access:  PIV IVF:  yes Proph:  SCD  Code Status: Full Family Communication: Patient and her son Disposition Plan: Plan for long-term management or correction of rectal prolapse   Consultants:  Gastroenterology, Doctor Benson [place]  General surgery  Procedures:  Chest x-ray  Colonoscopy on 12/31  Antibiotics:  None   HPI/Subjective:  Patient states that she feels well.   She denies nausea, vomiting, diarrhea.  She has persistent mild soreness of the LLQ when it is pressed, but not normally.    Objective: Filed Vitals:   06/29/13 0500 06/29/13 0600 06/29/13 0800 06/29/13 1205  BP:   167/46 110/39  Pulse: 54 55 61 53  Temp:   98.1 F (36.7 C) 97.9 F (36.6 C)  TempSrc:   Oral Oral  Resp: 19 15 15 28   Height:      Weight:      SpO2: 95% 97% 98% 97%    Intake/Output Summary (Last 24 hours) at 06/29/13 1524 Last data filed at 06/29/13 1200  Gross per 24 hour  Intake   2365 ml  Output   3200 ml  Net   -835 ml   Filed Weights   06/27/13 1711 06/28/13 0313 06/29/13 0416  Weight: 51.4 kg (113 lb 5.1 oz) 52.7 kg (116 lb 2.9 oz) 50.8 kg (111 lb 15.9 oz)    Exam:   General:  Caucasian female, No acute distress  HEENT:  NCAT, MMM  Cardiovascular:  RRR, nl S1, S2 no mrg, 2+ pulses, warm extremities  Respiratory:  CTAB, no increased WOB  Abdomen:   NABS, soft, ND, mildly tender to palpation in the left lower cautery and and slightly medial to the left lower quadrant, stable from yesterday  MSK:   Normal tone and bulk, no LEE  Neuro:  Grossly intact  Data Reviewed: Basic Metabolic Panel:  Recent Labs Lab 06/27/13 1218 06/27/13 1740 06/27/13 1940 06/28/13 0345 06/29/13 0330  NA 137 137 136* 139 139  K 4.5 4.6 4.3 4.1 3.7  CL 98 98 98  102 103  CO2 30 30 25 25 28   GLUCOSE 85 131* 170* 104* 100*  BUN 18 15 14 10 7   CREATININE 0.82 0.80 0.80 0.68 0.80  CALCIUM 8.7 8.6 8.4 8.5 8.7  MG  --  2.0  --   --   --   PHOS  --  4.1  --   --   --    Liver Function Tests:  Recent Labs Lab 06/27/13 1740 06/28/13 0345  AST 19 28  ALT 19 21  ALKPHOS 75 84  BILITOT <0.2* 0.2*  PROT 5.9* 6.5  ALBUMIN 3.0* 3.2*   No results found for this basename: LIPASE, AMYLASE,  in the last 168 hours No results found for this basename: AMMONIA,  in the last 168 hours CBC:  Recent Labs Lab 06/27/13 1218 06/27/13 1740 06/27/13 1940 06/28/13 0110 06/28/13 0345 06/29/13 0330  WBC 7.4 7.6 13.1*  --  9.4 8.3  NEUTROABS 3.8 3.7  --   --   --   --   HGB 11.9* 11.3* 11.5* 12.6 12.5 11.0*  HCT 34.7* 33.7* 35.1* 37.6 37.8 33.1*  MCV 92.8 93.4 93.4  --  93.6 92.5  PLT 229 235 251  --  224 209   Cardiac Enzymes:  Recent Labs Lab 06/27/13 1940 06/28/13 0110 06/28/13 0927  TROPONINI <0.30 <0.30 <0.30   BNP (last 3 results) No results found for this basename: PROBNP,  in the last 8760 hours CBG:  Recent Labs Lab 06/27/13 1937 06/29/13 0822  GLUCAP 155* 94    Recent Results (from the past 240 hour(s))  MRSA PCR SCREENING     Status: None   Collection Time    06/27/13  8:17 PM      Result Value Range Status   MRSA by PCR NEGATIVE  NEGATIVE Final   Comment:            The GeneXpert MRSA Assay (FDA     approved for NASAL specimens     only), is one component of a     comprehensive MRSA colonization     surveillance program. It is not     intended to diagnose MRSA     infection nor to guide or     monitor treatment for     MRSA infections.     Studies: Dg Chest Port 1 View  06/27/2013   CLINICAL DATA:  Syncope.  Leukocytosis.  EXAM: PORTABLE CHEST - 1 VIEW  COMPARISON:  None.  FINDINGS: Normal heart size and pulmonary vascularity. Probable emphysematous changes within the lungs with peribronchial thickening and central  interstitial changes consistent with chronic bronchitis. No focal airspace disease. No blunting of costophrenic angles. No pneumothorax.  IMPRESSION: No active disease.   Electronically Signed   By: Lucienne Capers M.D.   On: 06/27/2013 21:51    Scheduled Meds: . calcium-vitamin D  2 tablet Oral Daily  . citalopram  20 mg Oral Daily  . clonazePAM  0.5 mg Oral Daily  .  donepezil  10 mg Oral QHS  . levothyroxine  112.5 mcg Oral QAC breakfast  . metroNIDAZOLE  1,000 mg Oral Custom  . mirtazapine  15 mg Oral QHS  . neomycin  1,000 mg Oral Custom  . omega-3 acid ethyl esters  1 g Oral Daily  . pantoprazole (PROTONIX) IV  40 mg Intravenous Q12H  . pregabalin  100 mg Oral Daily  . simvastatin  10 mg Oral Daily   Continuous Infusions: . sodium chloride 50 mL/hr at 06/29/13 [id number]    Principal Problem:   GI bleed Active Problems:   Rectal bleed   Dyslipidemia   Hypothyroidism   Anxiety and depression   Dementia   Neuropathic pain   Rectal prolapse   Cardiac arrest   Vasovagal syncope    Time spent: 30 min    Nina Mondor, [place] [facility] Pager [phone]. If 7PM-7AM, please contact night-coverage at [email or web address], password [facility] 06/29/2013, 3:23 PM  LOS: 2 days

## 2013-06-29 NOTE — Progress Notes (Signed)
GI bleed  Subjective: Feels fine, no bleeding overnight  Objective: Vital signs in last 24 hours: Temp:  [97.3 F (36.3 C)-98.2 F (36.8 C)] 97.7 F (36.5 C) (01/01 0416) Pulse Rate:  [54-65] 55 (01/01 0600) Resp:  [13-60] 15 (01/01 0600) BP: (124-177)/(34-59) 154/51 mmHg (01/01 0400) SpO2:  [93 %-100 %] 97 % (01/01 0600) Weight:  [111 lb 15.9 oz (50.8 kg)] 111 lb 15.9 oz (50.8 kg) (01/01 0416) Last BM Date: 06/28/13  Intake/Output from previous day: 12/31 0701 - 01/01 0700 In: 1400 [P.O.:775; I.V.:605] Out: 3700 [Urine:3700] Intake/Output this shift:    General appearance: alert and cooperative GI: normal findings: soft, non-tender  Lab Results:  Results for orders placed during the hospital encounter of 06/27/13 (from the past 24 hour(s))  TROPONIN I     Status: None   Collection Time    06/28/13  9:27 AM      Result Value Range   Troponin I <0.30  <0.30 ng/mL  BASIC METABOLIC PANEL     Status: Abnormal   Collection Time    06/29/13  3:30 AM      Result Value Range   Sodium 139  137 - 147 mEq/L   Potassium 3.7  3.7 - 5.3 mEq/L   Chloride 103  96 - 112 mEq/L   CO2 28  19 - 32 mEq/L   Glucose, Bld 100 (*) 70 - 99 mg/dL   BUN 7  6 - 23 mg/dL   Creatinine, Ser 0.80  0.50 - 1.10 mg/dL   Calcium 8.7  8.4 - 10.5 mg/dL   GFR calc non Af Amer 65 (*) >90 mL/min   GFR calc Af Amer 75 (*) >90 mL/min  CBC     Status: Abnormal   Collection Time    06/29/13  3:30 AM      Result Value Range   WBC 8.3  4.0 - 10.5 K/uL   RBC 3.58 (*) 3.87 - 5.11 MIL/uL   Hemoglobin 11.0 (*) 12.0 - 15.0 g/dL   HCT 33.1 (*) 36.0 - 46.0 %   MCV 92.5  78.0 - 100.0 fL   MCH 30.7  26.0 - 34.0 pg   MCHC 33.2  30.0 - 36.0 g/dL   RDW 13.6  11.5 - 15.5 %   Platelets 209  150 - 400 K/uL     Studies/Results Radiology     MEDS, Scheduled . bisacodyl  10 mg Oral Once  . calcium-vitamin D  2 tablet Oral Daily  . citalopram  20 mg Oral Daily  . clonazePAM  0.5 mg Oral Daily  . donepezil   10 mg Oral QHS  . levothyroxine  112.5 mcg Oral QAC breakfast  . mirtazapine  15 mg Oral QHS  . omega-3 acid ethyl esters  1 g Oral Daily  . pantoprazole (PROTONIX) IV  40 mg Intravenous Q12H  . polyethylene glycol powder  1 Container Oral Once  . pregabalin  100 mg Oral Daily  . simvastatin  10 mg Oral Daily     Assessment: GI bleed Rectal prolapse  Plan: If echo normal, I think it would be reasonable to proceed to the OR tom.  Will start a Miralax bowel prep today.  Will discuss with family in AM before making any final decisions and posting the case.   Given constipation issues, would favor resection and rectopexy.    LOS: 2 days    Rosario Adie, MD [facility], [place]   06/29/2013 8:15 AM

## 2013-06-30 ENCOUNTER — Encounter (HOSPITAL_COMMUNITY): Payer: Medicare Other | Admitting: Anesthesiology

## 2013-06-30 ENCOUNTER — Inpatient Hospital Stay (HOSPITAL_COMMUNITY): Payer: Medicare Other | Admitting: Anesthesiology

## 2013-06-30 ENCOUNTER — Other Ambulatory Visit (INDEPENDENT_AMBULATORY_CARE_PROVIDER_SITE_OTHER): Payer: Self-pay | Admitting: General Surgery

## 2013-06-30 ENCOUNTER — Encounter (HOSPITAL_COMMUNITY): Admission: EM | Disposition: A | Discharge status: Home Health | Payer: Self-pay | Source: Home / Self Care

## 2013-06-30 ENCOUNTER — Encounter (HOSPITAL_COMMUNITY): Payer: Self-pay | Admitting: Gastroenterology

## 2013-06-30 DIAGNOSIS — K55059 Acute (reversible) ischemia of intestine, part and extent unspecified: Secondary | ICD-10-CM

## 2013-06-30 HISTORY — PX: LAPAROSCOPIC SIGMOID COLECTOMY: SHX5928

## 2013-06-30 LAB — CBC
HEMATOCRIT: 31.2 % — AB (ref 36.0–46.0)
Hemoglobin: 10.6 g/dL — ABNORMAL LOW (ref 12.0–15.0)
MCH: 31.2 pg (ref 26.0–34.0)
MCHC: 34 g/dL (ref 30.0–36.0)
MCV: 91.8 fL (ref 78.0–100.0)
Platelets: 210 10*3/uL (ref 150–400)
RBC: 3.4 MIL/uL — ABNORMAL LOW (ref 3.87–5.11)
RDW: 13.6 % (ref 11.5–15.5)
WBC: 8.7 10*3/uL (ref 4.0–10.5)

## 2013-06-30 LAB — GLUCOSE, CAPILLARY
Glucose-Capillary: 89 mg/dL (ref 70–99)
Glucose-Capillary: 95 mg/dL (ref 70–99)

## 2013-06-30 LAB — BASIC METABOLIC PANEL
BUN: 10 mg/dL (ref 6–23)
CHLORIDE: 103 meq/L (ref 96–112)
CO2: 23 mEq/L (ref 19–32)
Calcium: 8.5 mg/dL (ref 8.4–10.5)
Creatinine, Ser: 0.93 mg/dL (ref 0.50–1.10)
GFR calc Af Amer: 63 mL/min — ABNORMAL LOW (ref >90)
GFR calc non Af Amer: 54 mL/min — ABNORMAL LOW (ref >90)
GLUCOSE: 118 mg/dL — AB (ref 70–99)
Potassium: 3.9 mEq/L (ref 3.7–5.3)
Sodium: 136 mEq/L — ABNORMAL LOW (ref 137–147)

## 2013-06-30 LAB — SURGICAL PCR SCREEN
MRSA, PCR: NEGATIVE
Staphylococcus aureus: NEGATIVE

## 2013-06-30 SURGERY — COLECTOMY, SIGMOID, LAPAROSCOPIC
Anesthesia: General | Site: Abdomen

## 2013-06-30 MED ORDER — BUPIVACAINE-EPINEPHRINE PF 0.25-1:200000 % IJ SOLN
INTRAMUSCULAR | Status: DC | PRN
Start: 2013-06-30 — End: 2013-06-30
  Administered 2013-06-30 (×2): 10 mL

## 2013-06-30 MED ORDER — EPHEDRINE SULFATE 50 MG/ML IJ SOLN
INTRAMUSCULAR | Status: DC | PRN
  Administered 2013-06-30: 10 mg via INTRAVENOUS

## 2013-06-30 MED ORDER — DEXTROSE 5 % IV SOLN
2.0000 g | INTRAVENOUS | Status: AC
  Administered 2013-06-30: 2 g via INTRAVENOUS
  Filled 2013-06-30: qty 2

## 2013-06-30 MED ORDER — ENOXAPARIN SODIUM 40 MG/0.4ML ~~LOC~~ SOLN
40.0000 mg | SUBCUTANEOUS | Status: DC
  Administered 2013-07-01 – 2013-07-09 (×9): 40 mg via SUBCUTANEOUS
  Filled 2013-06-30 (×11): qty 0.4

## 2013-06-30 MED ORDER — ALVIMOPAN 12 MG PO CAPS
12.0000 mg | ORAL_CAPSULE | Freq: Two times a day (BID) | ORAL | Status: DC
  Filled 2013-06-30 (×2): qty 1

## 2013-06-30 MED ORDER — GLYCOPYRROLATE 0.2 MG/ML IJ SOLN
INTRAMUSCULAR | Status: DC | PRN
  Administered 2013-06-30: 0.2 mg via INTRAVENOUS
  Administered 2013-06-30: .4 mg via INTRAVENOUS

## 2013-06-30 MED ORDER — KCL IN DEXTROSE-NACL 20-5-0.45 MEQ/L-%-% IV SOLN
INTRAVENOUS | Status: DC
  Administered 2013-06-30 – 2013-07-03 (×2): via INTRAVENOUS
  Filled 2013-06-30 (×4): qty 1000

## 2013-06-30 MED ORDER — BUPIVACAINE-EPINEPHRINE 0.25% -1:200000 IJ SOLN
INTRAMUSCULAR | Status: AC
  Filled 2013-06-30: qty 1

## 2013-06-30 MED ORDER — FENTANYL CITRATE 0.05 MG/ML IJ SOLN
INTRAMUSCULAR | Status: AC
  Filled 2013-06-30: qty 5

## 2013-06-30 MED ORDER — LIDOCAINE HCL (CARDIAC) 20 MG/ML IV SOLN
INTRAVENOUS | Status: DC | PRN
  Administered 2013-06-30: 50 mg via INTRAVENOUS

## 2013-06-30 MED ORDER — HYDROMORPHONE HCL PF 1 MG/ML IJ SOLN
INTRAMUSCULAR | Status: DC | PRN
  Administered 2013-06-30 (×3): 0.5 mg via INTRAVENOUS

## 2013-06-30 MED ORDER — HYDROMORPHONE HCL PF 2 MG/ML IJ SOLN
INTRAMUSCULAR | Status: AC
  Filled 2013-06-30: qty 1

## 2013-06-30 MED ORDER — PROPOFOL 10 MG/ML IV BOLUS
INTRAVENOUS | Status: AC
  Filled 2013-06-30: qty 20

## 2013-06-30 MED ORDER — LACTATED RINGERS IV SOLN: INTRAVENOUS | Status: DC

## 2013-06-30 MED ORDER — LACTATED RINGERS IV SOLN
INTRAVENOUS | Status: DC | PRN
  Administered 2013-06-30 (×3): via INTRAVENOUS

## 2013-06-30 MED ORDER — GLYCOPYRROLATE 0.2 MG/ML IJ SOLN
INTRAMUSCULAR | Status: AC
  Filled 2013-06-30: qty 2

## 2013-06-30 MED ORDER — FENTANYL CITRATE 0.05 MG/ML IJ SOLN
INTRAMUSCULAR | Status: DC | PRN
  Administered 2013-06-30: 100 ug via INTRAVENOUS
  Administered 2013-06-30: 50 ug via INTRAVENOUS
  Administered 2013-06-30: 100 ug via INTRAVENOUS

## 2013-06-30 MED ORDER — DEXTROSE 5 % IV SOLN
2.0000 g | Freq: Two times a day (BID) | INTRAVENOUS | Status: AC
  Administered 2013-06-30: 2 g via INTRAVENOUS
  Filled 2013-06-30: qty 2

## 2013-06-30 MED ORDER — NEOSTIGMINE METHYLSULFATE 1 MG/ML IJ SOLN
INTRAMUSCULAR | Status: DC | PRN
  Administered 2013-06-30: 3 mg via INTRAVENOUS

## 2013-06-30 MED ORDER — HEPARIN SODIUM (PORCINE) 5000 UNIT/ML IJ SOLN
INTRAMUSCULAR | Status: AC
  Filled 2013-06-30: qty 1

## 2013-06-30 MED ORDER — HYDROMORPHONE HCL PF 1 MG/ML IJ SOLN
0.2500 mg | INTRAMUSCULAR | Status: DC | PRN
  Administered 2013-06-30: 0.5 mg via INTRAVENOUS
  Filled 2013-06-30: qty 1

## 2013-06-30 MED ORDER — HEPARIN SODIUM (PORCINE) 5000 UNIT/ML IJ SOLN
5000.0000 [IU] | Freq: Three times a day (TID) | INTRAMUSCULAR | Status: DC
  Administered 2013-06-30: 5000 [IU] via SUBCUTANEOUS

## 2013-06-30 MED ORDER — ONDANSETRON HCL 4 MG/2ML IJ SOLN
INTRAMUSCULAR | Status: DC | PRN
  Administered 2013-06-30: 4 mg via INTRAVENOUS

## 2013-06-30 MED ORDER — PROPOFOL 10 MG/ML IV BOLUS
INTRAVENOUS | Status: DC | PRN
  Administered 2013-06-30: 70 mg via INTRAVENOUS

## 2013-06-30 MED ORDER — PROMETHAZINE HCL 25 MG/ML IJ SOLN: 6.2500 mg | INTRAMUSCULAR | Status: DC | PRN

## 2013-06-30 MED ORDER — NEOSTIGMINE METHYLSULFATE 1 MG/ML IJ SOLN
INTRAMUSCULAR | Status: AC
  Filled 2013-06-30: qty 10

## 2013-06-30 MED ORDER — ROCURONIUM BROMIDE 100 MG/10ML IV SOLN
INTRAVENOUS | Status: AC
  Filled 2013-06-30: qty 1

## 2013-06-30 MED ORDER — SODIUM CHLORIDE 0.9 % IR SOLN
Status: DC | PRN
  Administered 2013-06-30: 1000 mL

## 2013-06-30 MED ORDER — LACTATED RINGERS IR SOLN
Status: DC | PRN
  Administered 2013-06-30: 1000 mL

## 2013-06-30 MED ORDER — ROCURONIUM BROMIDE 100 MG/10ML IV SOLN
INTRAVENOUS | Status: DC | PRN
  Administered 2013-06-30 (×3): 10 mg via INTRAVENOUS
  Administered 2013-06-30: 40 mg via INTRAVENOUS

## 2013-06-30 MED ORDER — ALVIMOPAN 12 MG PO CAPS
12.0000 mg | ORAL_CAPSULE | Freq: Once | ORAL | Status: AC
Start: 2013-06-30 — End: 2013-06-30
  Administered 2013-06-30: 12 mg via ORAL
  Filled 2013-06-30: qty 1

## 2013-06-30 MED ORDER — PHENYLEPHRINE HCL 10 MG/ML IJ SOLN
INTRAMUSCULAR | Status: AC
  Filled 2013-06-30: qty 2

## 2013-06-30 SURGICAL SUPPLY — 73 items
APPLIER CLIP 5 13 M/L LIGAMAX5 (MISCELLANEOUS)
BLADE EXTENDED COATED 6.5IN (ELECTRODE) ×3 IMPLANT
BLADE HEX COATED 2.75 (ELECTRODE) ×6 IMPLANT
BLADE SURG SZ10 CARB STEEL (BLADE) ×3 IMPLANT
CABLE HIGH FREQUENCY MONO STRZ (ELECTRODE) ×3 IMPLANT
CANISTER SUCTION 2500CC (MISCELLANEOUS) ×3 IMPLANT
CELLS DAT CNTRL 66122 CELL SVR (MISCELLANEOUS) ×1 IMPLANT
CHLORAPREP W/TINT 26ML (MISCELLANEOUS) ×3 IMPLANT
CLIP APPLIE 5 13 M/L LIGAMAX5 (MISCELLANEOUS) IMPLANT
COVER MAYO STAND STRL (DRAPES) ×6 IMPLANT
DECANTER SPIKE VIAL GLASS SM (MISCELLANEOUS) ×3 IMPLANT
DERMABOND ADVANCED (GAUZE/BANDAGES/DRESSINGS) ×2
DERMABOND ADVANCED .7 DNX12 (GAUZE/BANDAGES/DRESSINGS) ×1 IMPLANT
DEVICE TROCAR PUNCTURE CLOSURE (ENDOMECHANICALS) ×3 IMPLANT
DRAIN CHANNEL 19F RND (DRAIN) IMPLANT
DRAIN CHANNEL RND F F (WOUND CARE) ×3 IMPLANT
DRAPE LAPAROSCOPIC ABDOMINAL (DRAPES) ×3 IMPLANT
DRAPE LG THREE QUARTER DISP (DRAPES) ×6 IMPLANT
DRAPE UTILITY 15X26 (DRAPE) ×6 IMPLANT
DRAPE WARM FLUID 44X44 (DRAPE) ×3 IMPLANT
ELECT REM PT RETURN 9FT ADLT (ELECTROSURGICAL) ×3
ELECTRODE REM PT RTRN 9FT ADLT (ELECTROSURGICAL) ×1 IMPLANT
EVACUATOR SILICONE 100CC (DRAIN) ×6 IMPLANT
GLOVE BIO SURGEON STRL SZ 6.5 (GLOVE) ×6 IMPLANT
GLOVE BIO SURGEONS STRL SZ 6.5 (GLOVE) ×3
GLOVE BIOGEL PI IND STRL 7.0 (GLOVE) ×4 IMPLANT
GLOVE BIOGEL PI INDICATOR 7.0 (GLOVE) ×8
GOWN BRE IMP PREV XXLGXLNG (GOWN DISPOSABLE) ×6 IMPLANT
GOWN STRL REIN XL XLG (GOWN DISPOSABLE) ×24 IMPLANT
KIT BASIN OR (CUSTOM PROCEDURE TRAY) ×6 IMPLANT
LEGGING LITHOTOMY PAIR STRL (DRAPES) ×3 IMPLANT
LIGASURE IMPACT 36 18CM CVD LR (INSTRUMENTS) IMPLANT
NS IRRIG 1000ML POUR BTL (IV SOLUTION) ×3 IMPLANT
PENCIL BUTTON HOLSTER BLD 10FT (ELECTRODE) ×9 IMPLANT
RELOAD PROXIMATE 75MM BLUE (ENDOMECHANICALS) ×3 IMPLANT
RTRCTR WOUND ALEXIS 18CM MED (MISCELLANEOUS) ×3
SCISSORS LAP 5X35 DISP (ENDOMECHANICALS) ×3 IMPLANT
SEALER TISSUE G2 CVD JAW 35 (ENDOMECHANICALS) ×1 IMPLANT
SEALER TISSUE G2 CVD JAW 45CM (ENDOMECHANICALS) ×2
SET IRRIG TUBING LAPAROSCOPIC (IRRIGATION / IRRIGATOR) ×3 IMPLANT
SLEEVE XCEL OPT CAN 5 100 (ENDOMECHANICALS) ×6 IMPLANT
SOLUTION ANTI FOG 6CC (MISCELLANEOUS) ×3 IMPLANT
SPONGE GAUZE 4X4 12PLY (GAUZE/BANDAGES/DRESSINGS) ×3 IMPLANT
SPONGE LAP 18X18 X RAY DECT (DISPOSABLE) ×6 IMPLANT
STAPLER CIRC ILS CVD 33MM 37CM (STAPLE) ×3 IMPLANT
STAPLER PROXIMATE 75MM BLUE (STAPLE) ×3 IMPLANT
STAPLER VISISTAT 35W (STAPLE) ×3 IMPLANT
SUCTION POOLE TIP (SUCTIONS) ×3 IMPLANT
SUT ETHILON 2 0 PS N (SUTURE) ×6 IMPLANT
SUT NOVA NAB DX-16 0-1 5-0 T12 (SUTURE) IMPLANT
SUT PDS AB 1 CTX 36 (SUTURE) ×6 IMPLANT
SUT PDS AB 1 TP1 96 (SUTURE) IMPLANT
SUT PROLENE 2 0 BLUE (SUTURE) ×12 IMPLANT
SUT PROLENE 2 0 KS (SUTURE) ×3 IMPLANT
SUT SILK 2 0 (SUTURE) ×2
SUT SILK 2 0 SH CR/8 (SUTURE) ×3 IMPLANT
SUT SILK 2-0 18XBRD TIE 12 (SUTURE) ×1 IMPLANT
SUT SILK 3 0 (SUTURE) ×2
SUT SILK 3 0 SH CR/8 (SUTURE) ×3 IMPLANT
SUT SILK 3-0 18XBRD TIE 12 (SUTURE) ×1 IMPLANT
SUT VIC AB 2-0 SH 18 (SUTURE) ×3 IMPLANT
SUT VIC AB 4-0 PS2 27 (SUTURE) ×6 IMPLANT
SUT VICRYL 2 0 18  UND BR (SUTURE)
SUT VICRYL 2 0 18 UND BR (SUTURE) IMPLANT
SYS LAPSCP GELPORT 120MM (MISCELLANEOUS)
SYSTEM LAPSCP GELPORT 120MM (MISCELLANEOUS) IMPLANT
TOWEL OR 17X26 10 PK STRL BLUE (TOWEL DISPOSABLE) ×9 IMPLANT
TRAY FOLEY CATH 14FRSI W/METER (CATHETERS) ×3 IMPLANT
TRAY LAP CHOLE (CUSTOM PROCEDURE TRAY) ×3 IMPLANT
TROCAR BLADELESS OPT 5 100 (ENDOMECHANICALS) ×3 IMPLANT
TROCAR XCEL BLUNT TIP 100MML (ENDOMECHANICALS) ×3 IMPLANT
TUBING FILTER THERMOFLATOR (ELECTROSURGICAL) ×3 IMPLANT
YANKAUER SUCT BULB TIP 10FT TU (MISCELLANEOUS) ×6 IMPLANT

## 2013-06-30 NOTE — Anesthesia Preprocedure Evaluation (Signed)
Anesthesia Evaluation  Patient identified by MRN, date of birth, ID band Patient awake    Reviewed: Allergy & Precautions, H&P , NPO status , Patient's Chart, lab work & pertinent test results  Airway Mallampati: II TM Distance: <3 FB Neck ROM: Full    Dental no notable dental hx.    Pulmonary neg pulmonary ROS,  breath sounds clear to auscultation  Pulmonary exam normal       Cardiovascular Rhythm:Regular Rate:Bradycardia   Normal LV size and systolic function, EF 32-99%. Normal RV   size and systolic function. Borderline pulmonary   hypertension. No significant valvular abnormalities.    Neuro/Psych Dementia negative neurological ROS  negative psych ROS   GI/Hepatic negative GI ROS, Neg liver ROS,   Endo/Other  Hypothyroidism   Renal/GU negative Renal ROS  negative genitourinary   Musculoskeletal negative musculoskeletal ROS (+)   Abdominal   Peds negative pediatric ROS (+)  Hematology negative hematology ROS (+)   Anesthesia Other Findings   Reproductive/Obstetrics negative OB ROS                           Anesthesia Physical Anesthesia Plan  ASA: III  Anesthesia Plan: General   Post-op Pain Management:    Induction: Intravenous  Airway Management Planned: Oral ETT  Additional Equipment:   Intra-op Plan:   Post-operative Plan: Extubation in OR  Informed Consent: I have reviewed the patients History and Physical, chart, labs and discussed the procedure including the risks, benefits and alternatives for the proposed anesthesia with the patient or authorized representative who has indicated his/her understanding and acceptance.   Dental advisory given  Plan Discussed with: CRNA and Surgeon  Anesthesia Plan Comments:         Anesthesia Quick Evaluation

## 2013-06-30 NOTE — Op Note (Signed)
06/27/2013 - 06/30/2013  1:50 PM  PATIENT:  Erika Whitney  80 y.o. female  Patient Care Team: Stephens Shire, MD as PCP - General (Family Medicine)  PRE-OPERATIVE DIAGNOSIS:  Rectal prolapse   POST-OPERATIVE DIAGNOSIS:  rectal prolapse  PROCEDURE:  LAPAROSCOPIC RECTOPEXY WITH SIGMOID COLECTOMY  SURGEON:  Surgeon(s): Leighton Ruff, MD Adin Hector, MD  ASSISTANT: Johney Maine   ANESTHESIA:   general  EBL:  Total I/O In: 2000 [I.V.:2000] Out: 350 [Urine:350]  DRAINS: (57F) Jackson-Pratt drain(s) with closed bulb suction in the pelvis   SPECIMEN:  Source of Specimen:  rectosigmoid colon  DISPOSITION OF SPECIMEN:  PATHOLOGY  COUNTS:  YES  PLAN OF CARE: pt already admited  PATIENT DISPOSITION:  PACU - hemodynamically stable.  INDICATION: This is an 80 year old female who presented to the hospital with rectal bleeding and rectal prolapse.  Colonoscopy was performed which showed no evidence of tumors or other abnormalities. Rectal bleeding appeared to be caused by the rectal prolapse. Given these symptoms she was evaluated and cleared for surgery. We are here today to perform rectopexy and sigmoid resection to address her prolapse.   OR FINDINGS: Significant redundant rectum and colon  DESCRIPTION: the patient was identified in the preoperative holding area and taken to the OR where they were laid Supine on the operating room table.  Gen. anesthesia was induced without difficulty. SCDs were also noted to be in place prior to the initiation of anesthesia.  The patient was then prepped and draped in the usual sterile fashion.   A surgical timeout was performed indicating the correct patient, procedure, positioning and need for preoperative antibiotics.   I began by making a infraumbilical incision using a scalpel. Dissection was carried down to the fascia using electrocautery. The fascia was elevated with 2 Coker clamps. An umbilical hernia was encountered. The fascia was opened  using a scalpel. A pursestring suture was placed using an 0 Vicryl suture. A Hassan port was placed and secured. The abdomen was insufflated to approximately 15 mm of mercury. Examination of the abdomen revealed no injury upon entry. Evaluated the rest of the abdomen. The patient had a large redundant sigmoid colon. There were some adhesions at midline from her previous surgery that were taken down using the Enseal device. There was a portion of her small bowel that was adherent to the left lateral sidewall. This was taken down using laproscopic scissors.  I then entered the peritoneum and scored this on either side down to the peritoneal reflection.  Identified the left and right ureter and made sure to dissect medially to these.  I then mobilized the rectum posteriorly using mostly blunt dissection. I mobilized the lateral sidewalls using the Enseal device.  Dissection was carried down to the level of the levators. I evaluated the anus digitally. The dissection was complete. Hemostasis was achieved in the pelvis. I then made a vertical lower midline incision using a scalpel through previous scar. I then dissected down to the subcutaneous layers using Bovie electrocautery. The fascia was divided in midline. The peritoneum was entered. The small bowel was packed out of the pelvis. The sigmoid colon was brought out and an appropriate amount was resected using a GIA blue load 60 mm stapler. Once both ends were resected the remaining mesentery was transected using the Enseal device. The artery and vein were clamped with a Kelly clamp and tied with a 2-0 silk suture.  The specimen was then sent to pathology for further examination. I then evaluated  the proximal colon stump. There was a good pulse noted to be palpable at the end of the anastomosis and the staple line was bleeding.  I felt like the anastomosis would be well-perfused.  After this was completed the EEA sizers were brought onto the field. The colon easily  allowed a 33 mm EEA sizer to be introduced. We brought up a 33 mm EEA stapler. A pursestring device was used to place a 2 approaching across the proximal colon. This was secured into place with 3-0 silk sutures. The anvil was placed and the pursestring was tied tightly around this. After this was complete for 2-0 Prolene sutures were placed in the area of the sacral promontory.  After this we created an anastomosis at the level of the sacral promontory approximately 10 cm from the anal verge using the 33 mm EEA stapler. We then tested our anastomosis under water using insufflation. There were no signs of leak. The previously placed Prolene sutures were and tied to the rectal tissue distal to the anastomosis.  This fixed the rectum and colon to the sacrum.  A 19 [place] Blake drain was then placed into the pelvis and wrapped around the anastomosis. The small bowel was then placed back into the abdomen and the packs were removed. We switched to clean instruments. I fixed her umbilical hernia using interrupted 2-0 Vicryl sutures. The fascia was then closed using 2 #1 looped PDS sutures in a running fashion. The skin was then closed using a running 4-0 Vicryl suture for all incisions. A sterile dressing was placed at the lower midline incision. The remaining incisions were closed using Dermabond.  The patient was then awakened from anesthesia sent to the post anesthesia care unit in stable condition. All counts were correct per operating room staff.

## 2013-06-30 NOTE — Anesthesia Postprocedure Evaluation (Signed)
  Anesthesia Post-op Note  Patient: Erika Whitney  Procedure(s) Performed: Procedure(s) (LRB): LAPAROSCOPIC RECTOPEXY WITH SIGMOID COLECTOMY (N/A)  Patient Location: PACU  Anesthesia Type: General  Level of Consciousness: awake and alert   Airway and Oxygen Therapy: Patient Spontanous Breathing  Post-op Pain: mild  Post-op Assessment: Post-op Vital signs reviewed, Patient's Cardiovascular Status Stable, Respiratory Function Stable, Patent Airway and No signs of Nausea or vomiting  Last Vitals:  Filed Vitals:   06/30/13 1445  BP: 121/48  Pulse: 72  Temp: 34.7 C  Resp: 12    Post-op Vital Signs: stable   Complications: No apparent anesthesia complications

## 2013-06-30 NOTE — Progress Notes (Signed)
GI bleed  Subjective: Pt with rectal prolapse.  Cardiology states no contraindication to undergo anesthesia.    Objective: Vital signs in last 24 hours: Temp:  [97.6 F (36.4 C)-98.3 F (36.8 C)] 98.3 F (36.8 C) (01/02 0800) Pulse Rate:  [53-59] 58 (01/02 0710) Resp:  [13-28] 16 (01/02 0710) BP: (98-172)/(30-57) 117/39 mmHg (01/02 0710) SpO2:  [95 %-100 %] 96 % (01/02 0710) Weight:  [111 lb 1.8 oz (50.4 kg)] 111 lb 1.8 oz (50.4 kg) (01/02 0000) Last BM Date: 06/29/13  Intake/Output from previous day: 01/01 0701 - 01/02 0700 In: 2130 [P.O.:1080; I.V.:1050] Out: 2200 [Urine:2200] Intake/Output this shift:    General appearance: alert and cooperative GI: normal findings: soft, non-tender Skin: lower midline abd scar  Lab Results:  Results for orders placed during the hospital encounter of 06/27/13 (from the past 24 hour(s))  BASIC METABOLIC PANEL     Status: Abnormal   Collection Time    06/30/13  3:40 AM      Result Value Range   Sodium 136 (*) 137 - 147 mEq/L   Potassium 3.9  3.7 - 5.3 mEq/L   Chloride 103  96 - 112 mEq/L   CO2 23  19 - 32 mEq/L   Glucose, Bld 118 (*) 70 - 99 mg/dL   BUN 10  6 - 23 mg/dL   Creatinine, Ser 0.93  0.50 - 1.10 mg/dL   Calcium 8.5  8.4 - 10.5 mg/dL   GFR calc non Af Amer 54 (*) >90 mL/min   GFR calc Af Amer 63 (*) >90 mL/min  CBC     Status: Abnormal   Collection Time    06/30/13  3:40 AM      Result Value Range   WBC 8.7  4.0 - 10.5 K/uL   RBC 3.40 (*) 3.87 - 5.11 MIL/uL   Hemoglobin 10.6 (*) 12.0 - 15.0 g/dL   HCT 31.2 (*) 36.0 - 46.0 %   MCV 91.8  78.0 - 100.0 fL   MCH 31.2  26.0 - 34.0 pg   MCHC 34.0  30.0 - 36.0 g/dL   RDW 13.6  11.5 - 15.5 %   Platelets 210  150 - 400 K/uL  GLUCOSE, CAPILLARY     Status: None   Collection Time    06/30/13  7:32 AM      Result Value Range   Glucose-Capillary 89  70 - 99 mg/dL  GLUCOSE, CAPILLARY     Status: None   Collection Time    06/30/13  8:54 AM      Result Value Range   Glucose-Capillary 95  70 - 99 mg/dL   Comment 1 Notify RN     Comment 2 Documented in Chart       Studies/Results Radiology     MEDS, Scheduled . [facility]ael[facility] HOLD] calcium-vitamin D  2 tablet Oral Daily  . cefoTEtan (CEFOTAN) IV  2 g Intravenous On Call to OR  . [facility] HOLD] citalopram  20 mg Oral Daily  . [MAR HOLD] clonazePAM  0.5 mg Oral Daily  . [MAR HOLD] donepezil  10 mg Oral QHS  . [facility] HOLD] levothyroxine  112.5 mcg Oral QAC breakfast  . [MAR HOLD] mirtazapine  15 mg Oral QHS  . [MAR HOLD] omega-3 acid ethyl esters  1 g Oral Daily  . [MAR HOLD] pantoprazole (PROTONIX) IV  40 mg Intravenous Q12H  . [facility] HOLD] pregabalin  100 mg Oral Daily  . [facility] HOLD] simvastatin  10 mg Oral Daily  Assessment: GI bleed Rectal prolapse  Plan: OR for resection and rectopexy.  Discussed risks, benefits, surgical and medical options with patient and her family in detail.  We have decided to proceed with rectopexy and sigmoid resection.    The surgery and anatomy were described to the patient as well as the risks of surgery and the possible complications.  These include: Bleeding, infection and possible wound complications such as hernia, damage to adjacent structures, leak of surgical connections, which can lead to other surgeries and possibly an ostomy (5-7%), possible need for other procedures, such as abscess drains in radiology, possible prolonged hospital stay, possible diarrhea from removal of part of the colon, possible constipation from narcotics, prolonged fatigue/weakness or appetite loss, possible recurrence of prolapse, possible complications of their medical problems such as heart disease or arrhythmias or lung problems, death (less than 1%).  I believe the patient and her caregivers understand this and wish to proceed with the surgery.     LOS: 3 days    Rosario Adie, [facility], [place] [phone]   06/30/2013 9:51 AM

## 2013-06-30 NOTE — Progress Notes (Signed)
[facility] PROGRESS NOTE  Arti Kose [id number] DOB: [birth year] DOA: 06/27/2013 PCP: Stephens Shire, MD  Brief summary  80 year old female with a past medical history of hypothyroidism, dyslipidemia, anxiety, rectal prolapse who presented to [facility] ED 06/27/2013 with complaints of blood in stool ongoing for past several weeks without associated lightheadedness or loss of consciousness. Pt did not have previous evaluation with colonoscopy. No associated abdominal pain, nausea or vomiting. She is on aspirin and naproxen. No fever or chills. No chest pain, shortness of breath or palpitations.  Pt was hemodynamically stable on the admission but had visible blood on rectal exam.  She had rectal prolapse while sitting on toilet which likely led to vasovagal syncope and possible asystole vs. Severe bradycardia.  Chest compressions were done for 30 seconds with ROSC and spontaneous breathing.  She did not require intubation, shock, or administration of medications.  Colonoscopy on 12/31 confirmed rectal prolapse.  To OR for rectopexy on 06/30/2013  Assessment/Plan  Rectal prolapse with GI bleed. Appreciate gastroenterology assistance. Colonoscopy verified rectal prolapse on 12/31. -  Appreciate general surgery assistance, and transferred to General surgery -  Continue to hold aspirin and Naprosyn -  Continue IV Protonix -  Continue prn vicodin  Severe vasovagal syncope and hypotension due to rectal prolapse -  Minimize medications that will slow AV conduction -  Troponins neg -  Telemetry: Sinus rhythm -  ECHo demonstrates preserved ejection fraction without wall motion abnormalities, grade 1 diastolic dysfunction -  Anticipate patient will be able to transfer to telemetry tomorrow morning if she does well postoperatively  Elevated blood pressures -  Add prn hydralazine -  Avoid medications which can slow HR/AV node conduction  Dyslipidemia, stable - continue statin therapy   Hypothyroidism,  -   TSH 1.828, at goal - continue levothyroxine  Anxiety and depression stable - continue clonazepam and celexa  Dementia stable,  - continue aricept  Neuropathic pain stable - continue low dose lyrica  Diet:  Per surgery Access:  PIV IVF:  yes Proph:  SCD  Code Status: Full Family Communication: Patient and her son Disposition Plan:  PTOT ordered   Consultants:  Gastroenterology, Doctor Benson Norway  General surgery, Dr. Marcello Moores  Procedures:  Chest x-ray  Colonoscopy on 12/31  Antibiotics:  None   HPI/Subjective:  Patient states that she feels well.   She denies nausea, vomiting, diarrhea.  LLQ pain has improved some  Objective: Filed Vitals:   06/30/13 1600 06/30/13 1612 06/30/13 1700 06/30/13 1800  BP: 130/39   118/37  Pulse: 70 66 66 58  Temp: 97.7 F (36.5 C)     TempSrc: Oral     Resp: 15 9 16 11   Height:      Weight:      SpO2: 100% 100% 98% 100%    Intake/Output Summary (Last 24 hours) at 06/30/13 1919 Last data filed at 06/30/13 1800  Gross per 24 hour  Intake 3518.33 ml  Output   1445 ml  Net 2073.33 ml   Filed Weights   06/28/13 0313 06/29/13 0416 06/30/13 0000  Weight: 52.7 kg (116 lb 2.9 oz) 50.8 kg (111 lb 15.9 oz) 50.4 kg (111 lb 1.8 oz)    Exam:   General:  Caucasian female, No acute distress  HEENT:  NCAT, MMM  Cardiovascular:  RRR, nl S1, S2 no mrg, 2+ pulses, warm extremities  Respiratory:  CTAB, no increased WOB  Abdomen:   NABS, soft, ND, minimally tender to palpation in the  left lower quadrant, improved from yesterday  MSK:   Normal tone and bulk, no LEE  Neuro:  Grossly intact  Data Reviewed: Basic Metabolic Panel:  Recent Labs Lab 06/27/13 1218 06/27/13 1740 06/27/13 1940 06/28/13 0345 06/29/13 0330 06/30/13 0340  NA 137 137 136* 139 139 136*  K 4.5 4.6 4.3 4.1 3.7 3.9  CL 98 98 98 102 103 103  CO2 30 30 25 25 28 23   GLUCOSE 85 131* 170* 104* 100* 118*  BUN 18 15 14 10 7 10    CREATININE 0.82 0.80 0.80 0.68 0.80 0.93  CALCIUM 8.7 8.6 8.4 8.5 8.7 8.5  MG  --  2.0  --   --   --   --   PHOS  --  4.1  --   --   --   --    Liver Function Tests:  Recent Labs Lab 06/27/13 1740 06/28/13 0345  AST 19 28  ALT 19 21  ALKPHOS 75 84  BILITOT <0.2* 0.2*  PROT 5.9* 6.5  ALBUMIN 3.0* 3.2*   No results found for this basename: LIPASE, AMYLASE,  in the last 168 hours No results found for this basename: AMMONIA,  in the last 168 hours CBC:  Recent Labs Lab 06/27/13 1218 06/27/13 1740 06/27/13 1940 06/28/13 0110 06/28/13 0345 06/29/13 0330 06/30/13 0340  WBC 7.4 7.6 13.1*  --  9.4 8.3 8.7  NEUTROABS 3.8 3.7  --   --   --   --   --   HGB 11.9* 11.3* 11.5* 12.6 12.5 11.0* 10.6*  HCT 34.7* 33.7* 35.1* 37.6 37.8 33.1* 31.2*  MCV 92.8 93.4 93.4  --  93.6 92.5 91.8  PLT 229 235 251  --  224 209 210   Cardiac Enzymes:  Recent Labs Lab 06/27/13 1940 06/28/13 0110 06/28/13 0927  TROPONINI <0.30 <0.30 <0.30   BNP (last 3 results) No results found for this basename: PROBNP,  in the last 8760 hours CBG:  Recent Labs Lab 06/27/13 1937 06/29/13 0822 06/30/13 0732 06/30/13 0854  GLUCAP 155* 94 89 95    Recent Results (from the past 240 hour(s))  MRSA PCR SCREENING     Status: None   Collection Time    06/27/13  8:17 PM      Result Value Range Status   MRSA by PCR NEGATIVE  NEGATIVE Final   Comment:            The GeneXpert MRSA Assay (FDA     approved for NASAL specimens     only), is one component of a     comprehensive MRSA colonization     surveillance program. It is not     intended to diagnose MRSA     infection nor to guide or     monitor treatment for     MRSA infections.  SURGICAL PCR SCREEN     Status: None   Collection Time    06/30/13  8:39 AM      Result Value Range Status   MRSA, PCR NEGATIVE  NEGATIVE Final   Staphylococcus aureus NEGATIVE  NEGATIVE Final   Comment:            The Xpert SA Assay (FDA     approved for NASAL  specimens     in patients over 70 years of age),     is one component of     a comprehensive surveillance     program.  Test performance has  been validated by [facility] for patients greater     than or equal to 71 year old.     It is not intended     to diagnose infection nor to     guide or monitor treatment.     Studies: No results found.  Scheduled Meds: . [START ON 07/01/2013] alvimopan  12 mg Oral BID  . calcium-vitamin D  2 tablet Oral Daily  . cefoTEtan (CEFOTAN) IV  2 g Intravenous Q12H  . citalopram  20 mg Oral Daily  . clonazePAM  0.5 mg Oral Daily  . donepezil  10 mg Oral QHS  . [START ON 07/01/2013] enoxaparin (LOVENOX) injection  40 mg Subcutaneous Q24H  . levothyroxine  112.5 mcg Oral QAC breakfast  . mirtazapine  15 mg Oral QHS  . omega-3 acid ethyl esters  1 g Oral Daily  . pantoprazole (PROTONIX) IV  40 mg Intravenous Q12H  . pregabalin  100 mg Oral Daily  . simvastatin  10 mg Oral Daily   Continuous Infusions: . sodium chloride 50 mL/hr at 06/29/13 2024  . dextrose 5 % and 0.45 % NaCl with KCl 20 mEq/L 50 mL/hr at 06/30/13 1638    Principal Problem:   GI bleed Active Problems:   Rectal bleed   Dyslipidemia   Hypothyroidism   Anxiety and depression   Dementia   Neuropathic pain   Rectal prolapse   Vasovagal syncope    Time spent: 30 min    Danika Kluender, [facility] Pager [phone]. If 7PM-7AM, please contact night-coverage at [email or web address], password [facility] 06/30/2013, 7:19 PM  LOS: 3 days

## 2013-06-30 NOTE — Progress Notes (Signed)
PT Cancellation Note  Patient Details Name: Erika Whitney MRN: [id number] DOB: 03/08/1925   Cancelled Treatment:     pt at procedure; will check back at earliest convenience   [facility] 06/30/2013, 1:29 PM

## 2013-06-30 NOTE — Transfer of Care (Signed)
Immediate Anesthesia Transfer of Care Note  Patient: Erika Whitney  Procedure(s) Performed: Procedure(s): LAPAROSCOPIC RECTOPEXY WITH SIGMOID COLECTOMY (N/A)  Patient Location: PACU  Anesthesia Type:General  Level of Consciousness: awake and alert   Airway & Oxygen Therapy: Patient Spontanous Breathing and Patient connected to face mask oxygen  Post-op Assessment: Report given to PACU RN and Post -op Vital signs reviewed and stable  Post vital signs: Reviewed and stable  Complications: No apparent anesthesia complications

## 2013-07-01 LAB — BASIC METABOLIC PANEL
BUN: 7 mg/dL (ref 6–23)
CALCIUM: 7.8 mg/dL — AB (ref 8.4–10.5)
CO2: 27 meq/L (ref 19–32)
Chloride: 102 mEq/L (ref 96–112)
Creatinine, Ser: 0.83 mg/dL (ref 0.50–1.10)
GFR calc Af Amer: 72 mL/min — ABNORMAL LOW (ref >90)
GFR calc non Af Amer: 62 mL/min — ABNORMAL LOW (ref >90)
Glucose, Bld: 124 mg/dL — ABNORMAL HIGH (ref 70–99)
Potassium: 3.7 mEq/L (ref 3.7–5.3)
Sodium: 138 mEq/L (ref 137–147)

## 2013-07-01 LAB — CBC
HEMATOCRIT: 31.9 % — AB (ref 36.0–46.0)
Hemoglobin: 10.7 g/dL — ABNORMAL LOW (ref 12.0–15.0)
MCH: 30.9 pg (ref 26.0–34.0)
MCHC: 33.5 g/dL (ref 30.0–36.0)
MCV: 92.2 fL (ref 78.0–100.0)
Platelets: 211 10*3/uL (ref 150–400)
RBC: 3.46 MIL/uL — AB (ref 3.87–5.11)
RDW: 13.6 % (ref 11.5–15.5)
WBC: 7.4 10*3/uL (ref 4.0–10.5)

## 2013-07-01 LAB — GLUCOSE, CAPILLARY
GLUCOSE-CAPILLARY: 124 mg/dL — AB (ref 70–99)
Glucose-Capillary: 94 mg/dL (ref 70–99)

## 2013-07-01 MED ORDER — LORAZEPAM 2 MG/ML IJ SOLN: 1.0000 mg | Freq: Four times a day (QID) | INTRAMUSCULAR | Status: AC | PRN

## 2013-07-01 MED ORDER — LEVOTHYROXINE SODIUM 100 MCG IV SOLR
50.0000 ug | Freq: Every day | INTRAVENOUS | Status: DC
  Administered 2013-07-02: 50 ug via INTRAVENOUS
  Filled 2013-07-01: qty 5

## 2013-07-01 NOTE — Progress Notes (Signed)
[facility] PROGRESS NOTE  Erika Whitney [id number] DOB: 12/18/1924 DOA: 06/27/2013 PCP: Stephens Shire, MD  Brief summary  80 year old female with a past medical history of hypothyroidism, dyslipidemia, anxiety, rectal prolapse who presented to [facility] ED 06/27/2013 with complaints of blood in stool ongoing for past several weeks without associated lightheadedness or loss of consciousness. Pt did not have previous evaluation with colonoscopy. No associated abdominal pain, nausea or vomiting. She is on aspirin and naproxen. No fever or chills. No chest pain, shortness of breath or palpitations.  Pt was hemodynamically stable on the admission but had visible blood on rectal exam.  She had rectal prolapse while sitting on toilet which likely led to vasovagal syncope and possible asystole vs. Severe bradycardia.  Chest compressions were done for 30 seconds with ROSC and spontaneous breathing.  She did not require intubation, shock, or administration of medications.  Colonoscopy on 12/31 confirmed rectal prolapse.  To OR for rectopexy on 06/30/2013  Assessment/Plan  Rectal prolapse with GI bleed. Appreciate gastroenterology assistance. Colonoscopy verified rectal prolapse on 12/31. -  Appreciate general surgery assistance, and transferred to General surgery -  Continue to hold aspirin and Naprosyn -  Continue IV Protonix -  Continue prn vicodin  Severe vasovagal syncope and hypotension due to rectal prolapse -  Minimize medications that will slow AV conduction -  Troponins neg -  Telemetry: Sinus rhythm -  ECHo demonstrates preserved ejection fraction without wall motion abnormalities, grade 1 diastolic dysfunction -  Transfer to telemetry  Elevated blood pressures likely due to pain -  Add prn hydralazine -  Avoid medications which can slow HR/AV node conduction  Dyslipidemia, stable, hold statin while NPO  Hypothyroidism, transition to IV NPO -   TSH 1.828, at goal  Anxiety and  depression stable, hold clonazepam and celexa while NPO.  Ativan prn  Dementia stable, hold aricept while NPO  Neuropathic pain stable, hold lyrica while NPO  Restart oral medications once diet advanced.  Diet:  Per surgery Access:  PIV IVF:  yes Proph:  SCD  Code Status: Full Family Communication: Patient and her son Disposition Plan:  PTOT ordered   Consultants:  Gastroenterology, Doctor Benson Norway  General surgery, Dr. Marcello Moores  Procedures:  Chest x-ray  Colonoscopy on 12/31  Resection and rectopexy 06/30/2013  Antibiotics:  None   HPI/Subjective:  Patient states that she feels well.   No gas or BM since yesterday.  abd sore  Objective: Filed Vitals:   07/01/13 1100 07/01/13 1200 07/01/13 1230 07/01/13 1400  BP: 180/54  142/78 123/66  Pulse: 70  62 61  Temp:  98.7 F (37.1 C) 98.5 F (36.9 C) 98 F (36.7 C)  TempSrc:  Oral Oral Oral  Resp: 16   18  Height:      Weight:      SpO2: 98%  98% 90%    Intake/Output Summary (Last 24 hours) at 07/01/13 1849 Last data filed at 07/01/13 1800  Gross per 24 hour  Intake   1200 ml  Output   1560 ml  Net   -360 ml   Filed Weights   06/29/13 0416 06/30/13 0000 07/01/13 0400  Weight: 50.8 kg (111 lb 15.9 oz) 50.4 kg (111 lb 1.8 oz) 50.4 kg (111 lb 1.8 oz)    Exam:   General:  Caucasian female, No acute distress  HEENT:  NCAT, MMM  Cardiovascular:  RRR, nl S1, S2 no mrg, 2+ pulses, warm extremities  Respiratory:  CTAB, no increased  WOB  Abdomen:   Hypoactive and high pitched BS, mildly distended,  tender to palpation throughout.  Drain with serosanguinous   MSK:   Normal tone and bulk, no LEE  Neuro:  Grossly intact  Data Reviewed: Basic Metabolic Panel:  Recent Labs Lab 06/27/13 1218 06/27/13 1740 06/27/13 1940 06/28/13 0345 06/29/13 0330 06/30/13 0340 07/01/13 0500  NA 137 137 136* 139 139 136* 138  K 4.5 4.6 4.3 4.1 3.7 3.9 3.7  CL 98 98 98 102 103 103 102  CO2 30 30 25 25 28 23 27    GLUCOSE 85 131* 170* 104* 100* 118* 124*  BUN 18 15 14 10 7 10 7   CREATININE 0.82 0.80 0.80 0.68 0.80 0.93 0.83  CALCIUM 8.7 8.6 8.4 8.5 8.7 8.5 7.8*  MG  --  2.0  --   --   --   --   --   PHOS  --  4.1  --   --   --   --   --    Liver Function Tests:  Recent Labs Lab 06/27/13 1740 06/28/13 0345  AST 19 28  ALT 19 21  ALKPHOS 75 84  BILITOT <0.2* 0.2*  PROT 5.9* 6.5  ALBUMIN 3.0* 3.2*   No results found for this basename: LIPASE, AMYLASE,  in the last 168 hours No results found for this basename: AMMONIA,  in the last 168 hours CBC:  Recent Labs Lab 06/27/13 1218 06/27/13 1740 06/27/13 1940 06/28/13 0110 06/28/13 0345 06/29/13 0330 06/30/13 0340 07/01/13 0500  WBC 7.4 7.6 13.1*  --  9.4 8.3 8.7 7.4  NEUTROABS 3.8 3.7  --   --   --   --   --   --   HGB 11.9* 11.3* 11.5* 12.6 12.5 11.0* 10.6* 10.7*  HCT 34.7* 33.7* 35.1* 37.6 37.8 33.1* 31.2* 31.9*  MCV 92.8 93.4 93.4  --  93.6 92.5 91.8 92.2  PLT 229 235 251  --  224 209 210 211   Cardiac Enzymes:  Recent Labs Lab 06/27/13 1940 06/28/13 0110 06/28/13 0927  TROPONINI <0.30 <0.30 <0.30   BNP (last 3 results) No results found for this basename: PROBNP,  in the last 8760 hours CBG:  Recent Labs Lab 06/29/13 0822 06/30/13 0732 06/30/13 0854 07/01/13 0729 07/01/13 1154  GLUCAP 94 89 95 94 124*    Recent Results (from the past 240 hour(s))  MRSA PCR SCREENING     Status: None   Collection Time    06/27/13  8:17 PM      Result Value Range Status   MRSA by PCR NEGATIVE  NEGATIVE Final   Comment:            The GeneXpert MRSA Assay (FDA     approved for NASAL specimens     only), is one component of a     comprehensive MRSA colonization     surveillance program. It is not     intended to diagnose MRSA     infection nor to guide or     monitor treatment for     MRSA infections.  SURGICAL PCR SCREEN     Status: None   Collection Time    06/30/13  8:39 AM      Result Value Range Status   MRSA,  PCR NEGATIVE  NEGATIVE Final   Staphylococcus aureus NEGATIVE  NEGATIVE Final   Comment:            The Xpert SA Assay (FDA  approved for NASAL specimens     in patients over 58 years of age),     is one component of     a comprehensive surveillance     program.  Test performance has     been validated by [employer] for patients greater     than or equal to 29 year old.     It is not intended     to diagnose infection nor to     guide or monitor treatment.     Studies: No results found.  Scheduled Meds: . enoxaparin (LOVENOX) injection  40 mg Subcutaneous Q24H  . pantoprazole (PROTONIX) IV  40 mg Intravenous Q12H   Continuous Infusions: . dextrose 5 % and 0.45 % NaCl with KCl 20 mEq/L 50 mL/hr at 06/30/13 1638    Principal Problem:   GI bleed Active Problems:   Rectal bleed   Dyslipidemia   Hypothyroidism   Anxiety and depression   Dementia   Neuropathic pain   Rectal prolapse   Vasovagal syncope    Time spent: 30 min    Jeanette Moffatt, [facility] Pager [phone]. If 7PM-7AM, please contact night-coverage at [email or web address], password [facility] 07/01/2013, 6:49 PM  LOS: 4 days

## 2013-07-01 NOTE — Progress Notes (Signed)
Physical Therapy Treatment Patient Details Name: Erika Whitney MRN: [id number] DOB: [birth year] Today's Date: 07/01/2013 Time: 1062-6948 PT Time Calculation (min): 32 min  PT Assessment / Plan / Recommendation  History of Present Illness pt was admitted for GI Bleed. She is s/p colonoscopy   PT Comments   Pt progressing; May want to try [facility] next visit;   Follow Up Recommendations  Home health PT;SNF     Does the patient have the potential to tolerate intense rehabilitation     Barriers to Discharge        Equipment Recommendations  None recommended by PT    Recommendations for Other Services    Frequency Min 3X/week   Progress towards PT Goals Progress towards PT goals: Progressing toward goals  Plan Current plan remains appropriate    Precautions / Restrictions Precautions Precautions: Fall Precaution Comments: JP drain   Pertinent Vitals/Pain VSS, see gait section  Denies pain although was having lots of abd pain earlier   Mobility  Bed Mobility Bed Mobility: Supine to Sit Supine to Sit: 5: Supervision Details for Bed Mobility Assistance: for safety and line management Transfers Transfers: Sit to Stand;Stand to Sit Sit to Stand: 4: Min guard Stand to Sit: 4: Min guard Details for Transfer Assistance: requires incr time; cues for hand placement, safety Ambulation/Gait Ambulation/Gait Assistance: 4: Min assist;4: Min guard Ambulation Distance (Feet): 200 Feet Assistive device: Other (Comment);None;1 person hand held assist (IV push) Ambulation/Gait Assistance Details: pt min to min/guard for balance; LOB x 3 intially with min to recover; requring less assist even without IV pole support last 15' Gait Pattern: Step-through pattern General Gait Details: sats 100% on RA and HR 64 after amb    Exercises     PT Diagnosis:    PT Problem List:   PT Treatment Interventions:     PT Goals (current goals can now be found in the care plan section) Acute Rehab PT  Goals PT Goal Formulation: With patient/family Time For Goal Achievement: 07/12/13 Potential to Achieve Goals: Good  Visit Information  Last PT Received On: 07/01/13 Assistance Needed: +1 History of Present Illness: pt was admitted for GI Bleed. She is s/p colonoscopy    Subjective Data      Cognition  Cognition Arousal/Alertness: Awake/alert Behavior During Therapy: WFL for tasks assessed/performed Overall Cognitive Status: Within Functional Limits for tasks assessed    Balance  Dynamic Standing Balance Dynamic Standing - Balance Support: No upper extremity supported;During functional activity Dynamic Standing - Level of Assistance: 4: Min assist  End of Session PT - End of Session Equipment Utilized During Treatment: Gait belt Activity Tolerance: Patient tolerated treatment well Patient left: in chair;with call bell/phone within reach;with family/visitor present Nurse Communication: Mobility status   GP     [facility] 07/01/2013, 4:33 PM

## 2013-07-01 NOTE — Progress Notes (Signed)
Pt was up to bathroom with assistance. Pt became light headed and felt as if she was going to faint.  The tech and I were able to get pt to bedside commode and slide pt back to bed. Pt did not fall. Once pt back in bed vitals were 112/67 and pulse was 60. Pt stated she is starting to feel better. MD notified and ordered not to get pt out of bed the rest of the night. . Will continue to monitor pt Erika Whitney

## 2013-07-01 NOTE — Progress Notes (Addendum)
1 Day Post-Op  Subjective: Awake and alert comfortable  Objective: Vital signs in last 24 hours: Temp:  [94.3 F (34.6 C)-98.3 F (36.8 C)] 98.1 F (36.7 C) (01/03 0400) Pulse Rate:  [58-86] 65 (01/03 0600) Resp:  [9-20] 16 (01/03 0600) BP: (105-172)/(34-80) 172/50 mmHg (01/03 0600) SpO2:  [83 %-100 %] 100 % (01/03 0600) Arterial Line BP: (107-150)/(35-53) 133/46 mmHg (01/02 1515) Weight:  [111 lb 1.8 oz (50.4 kg)] 111 lb 1.8 oz (50.4 kg) (01/03 0400) Last BM Date: 06/30/13  Intake/Output from previous day: 01/02 0701 - 01/03 0700 In: 3718.3 [I.V.:3718.3] Out: 1305 [Urine:1045; Drains:260] Intake/Output this shift:    Looks good Lungs clear Abdomen soft, dressing dry, drain serosang  Lab Results:   Recent Labs  06/30/13 0340 07/01/13 0500  WBC 8.7 7.4  HGB 10.6* 10.7*  HCT 31.2* 31.9*  PLT 210 211   BMET  Recent Labs  06/30/13 0340 07/01/13 0500  NA 136* 138  K 3.9 3.7  CL 103 102  CO2 23 27  GLUCOSE 118* 124*  BUN 10 7  CREATININE 0.93 0.83  CALCIUM 8.5 7.8*   PT/INR No results found for this basename: LABPROT, INR,  in the last 72 hours ABG No results found for this basename: PHART, PCO2, PO2, HCO3,  in the last 72 hours  Studies/Results: No results found.  Anti-infectives: Anti-infectives   Start     Dose/Rate Route Frequency Ordered Stop   06/30/13 2200  cefoTEtan (CEFOTAN) 2 g in dextrose 5 % 50 mL IVPB     2 g 100 mL/hr over 30 Minutes Intravenous Every 12 hours 06/30/13 1528 06/30/13 2228   06/30/13 0900  cefoTEtan (CEFOTAN) 2 g in dextrose 5 % 50 mL IVPB     2 g 100 mL/hr over 30 Minutes Intravenous On call to O.R. 06/30/13 0825 06/30/13 1015   06/29/13 1300  neomycin (MYCIFRADIN) tablet 1,000 mg     1,000 mg Oral 3 times per day on Thu 06/29/13 0951 06/29/13 2225   06/29/13 1300  metroNIDAZOLE (FLAGYL) tablet 1,000 mg     1,000 mg Oral 3 times per day on Thu 06/29/13 1009 06/29/13 2225   06/29/13 0951  metroNIDAZOLE (FLAGYL) tablet  500 mg  Status:  Discontinued    Comments:  Take 2 pills (=1000mg ) by mouth at 1pm, 3pm, and 10pm the day before your colorectal operation   500 mg Oral As directed 06/29/13 0951 06/29/13 1007      Assessment/Plan: s/p Procedure(s): LAPAROSCOPIC RECTOPEXY WITH SIGMOID COLECTOMY (N/A)  Continue NPO with ice chips today Leave drain D/c foley on POD#2 per Dr. Marcello Moores  LOS: 4 days    Erika Whitney A 07/01/2013

## 2013-07-01 NOTE — Progress Notes (Signed)
Report called to Diane, RN for transfer to 5W.

## 2013-07-02 LAB — CBC
HEMATOCRIT: 30.1 % — AB (ref 36.0–46.0)
HEMOGLOBIN: 10.4 g/dL — AB (ref 12.0–15.0)
MCH: 31.3 pg (ref 26.0–34.0)
MCHC: 34.6 g/dL (ref 30.0–36.0)
MCV: 90.7 fL (ref 78.0–100.0)
Platelets: 238 10*3/uL (ref 150–400)
RBC: 3.32 MIL/uL — ABNORMAL LOW (ref 3.87–5.11)
RDW: 13.7 % (ref 11.5–15.5)
WBC: 8 10*3/uL (ref 4.0–10.5)

## 2013-07-02 LAB — BASIC METABOLIC PANEL
BUN: 6 mg/dL (ref 6–23)
CHLORIDE: 100 meq/L (ref 96–112)
CO2: 27 meq/L (ref 19–32)
Calcium: 8.2 mg/dL — ABNORMAL LOW (ref 8.4–10.5)
Creatinine, Ser: 0.81 mg/dL (ref 0.50–1.10)
GFR calc non Af Amer: 64 mL/min — ABNORMAL LOW (ref >90)
GFR, EST AFRICAN AMERICAN: 74 mL/min — AB (ref >90)
Glucose, Bld: 126 mg/dL — ABNORMAL HIGH (ref 70–99)
POTASSIUM: 3.9 meq/L (ref 3.7–5.3)
Sodium: 137 mEq/L (ref 137–147)

## 2013-07-02 LAB — GLUCOSE, CAPILLARY: Glucose-Capillary: 101 mg/dL — ABNORMAL HIGH (ref 70–99)

## 2013-07-02 MED ORDER — MIRTAZAPINE 15 MG PO TABS
15.0000 mg | ORAL_TABLET | Freq: Every day | ORAL | Status: DC
  Administered 2013-07-02 – 2013-07-08 (×7): 15 mg via ORAL
  Filled 2013-07-02 (×10): qty 1

## 2013-07-02 MED ORDER — LEVOTHYROXINE SODIUM 75 MCG PO TABS
112.5000 ug | ORAL_TABLET | Freq: Every day | ORAL | Status: DC
  Administered 2013-07-03 – 2013-07-09 (×7): 112.5 ug via ORAL
  Filled 2013-07-02 (×8): qty 1.5

## 2013-07-02 MED ORDER — LORATADINE 10 MG PO TABS
10.0000 mg | ORAL_TABLET | Freq: Every day | ORAL | Status: DC
  Administered 2013-07-03 – 2013-07-09 (×7): 10 mg via ORAL
  Filled 2013-07-02 (×7): qty 1

## 2013-07-02 MED ORDER — PREGABALIN 50 MG PO CAPS
100.0000 mg | ORAL_CAPSULE | Freq: Every day | ORAL | Status: DC
  Administered 2013-07-03 – 2013-07-09 (×7): 100 mg via ORAL
  Filled 2013-07-02 (×7): qty 2

## 2013-07-02 MED ORDER — SIMVASTATIN 20 MG PO TABS
20.0000 mg | ORAL_TABLET | Freq: Every day | ORAL | Status: DC
  Administered 2013-07-03 – 2013-07-08 (×6): 20 mg via ORAL
  Filled 2013-07-02 (×7): qty 1

## 2013-07-02 MED ORDER — CALCIUM CARBONATE-VITAMIN D 500-200 MG-UNIT PO TABS
2.0000 | ORAL_TABLET | Freq: Every day | ORAL | Status: DC
  Administered 2013-07-03 – 2013-07-09 (×7): 2 via ORAL
  Filled 2013-07-02 (×8): qty 2

## 2013-07-02 MED ORDER — DONEPEZIL HCL 10 MG PO TABS
10.0000 mg | ORAL_TABLET | Freq: Every day | ORAL | Status: DC
  Administered 2013-07-02 – 2013-07-08 (×7): 10 mg via ORAL
  Filled 2013-07-02 (×9): qty 1

## 2013-07-02 MED ORDER — CLONAZEPAM 0.5 MG PO TABS
0.5000 mg | ORAL_TABLET | Freq: Every day | ORAL | Status: DC
  Administered 2013-07-03 – 2013-07-09 (×7): 0.5 mg via ORAL
  Filled 2013-07-02 (×7): qty 1

## 2013-07-02 MED ORDER — CITALOPRAM HYDROBROMIDE 20 MG PO TABS
20.0000 mg | ORAL_TABLET | Freq: Every day | ORAL | Status: DC
  Administered 2013-07-03 – 2013-07-09 (×7): 20 mg via ORAL
  Filled 2013-07-02 (×7): qty 1

## 2013-07-02 NOTE — Progress Notes (Signed)
2 Days Post-Op  Subjective: Doing well Light headed when getting up last evening Passing flatus  Objective: Vital signs in last 24 hours: Temp:  [97.6 F (36.4 C)-98.7 F (37.1 C)] 97.6 F (36.4 C) (01/04 0514) Pulse Rate:  [60-71] 69 (01/04 0514) Resp:  [16-18] 16 (01/04 0514) BP: (112-180)/(47-78) 139/55 mmHg (01/04 0514) SpO2:  [90 %-100 %] 98 % (01/04 0514) Weight:  [124 lb 5.4 oz (56.4 kg)] 124 lb 5.4 oz (56.4 kg) (01/04 0514) Last BM Date: 06/30/13  Intake/Output from previous day: 01/03 0701 - 01/04 0700 In: 1150 [I.V.:1150] Out: 2405 [Urine:2065; Drains:340] Intake/Output this shift:    Abdomen soft, non distended, dressings dry, drain serosang  Lab Results:   Recent Labs  07/01/13 0500 07/02/13 0540  WBC 7.4 8.0  HGB 10.7* 10.4*  HCT 31.9* 30.1*  PLT 211 238   BMET  Recent Labs  07/01/13 0500 07/02/13 0540  NA 138 137  K 3.7 3.9  CL 102 100  CO2 27 27  GLUCOSE 124* 126*  BUN 7 6  CREATININE 0.83 0.81  CALCIUM 7.8* 8.2*   PT/INR No results found for this basename: LABPROT, INR,  in the last 72 hours ABG No results found for this basename: PHART, PCO2, PO2, HCO3,  in the last 72 hours  Studies/Results: No results found.  Anti-infectives: Anti-infectives   Start     Dose/Rate Route Frequency Ordered Stop   06/30/13 2200  cefoTEtan (CEFOTAN) 2 g in dextrose 5 % 50 mL IVPB     2 g 100 mL/hr over 30 Minutes Intravenous Every 12 hours 06/30/13 1528 06/30/13 2228   06/30/13 0900  cefoTEtan (CEFOTAN) 2 g in dextrose 5 % 50 mL IVPB     2 g 100 mL/hr over 30 Minutes Intravenous On call to O.R. 06/30/13 0825 06/30/13 1015   06/29/13 1300  neomycin (MYCIFRADIN) tablet 1,000 mg     1,000 mg Oral 3 times per day on Thu 06/29/13 0951 06/29/13 2225   06/29/13 1300  metroNIDAZOLE (FLAGYL) tablet 1,000 mg     1,000 mg Oral 3 times per day on Thu 06/29/13 1009 06/29/13 2225   06/29/13 0951  metroNIDAZOLE (FLAGYL) tablet 500 mg  Status:  Discontinued     Comments:  Take 2 pills (=1000mg ) by mouth at 1pm, 3pm, and 10pm the day before your colorectal operation   500 mg Oral As directed 06/29/13 0951 06/29/13 1007      Assessment/Plan: s/p Procedure(s): LAPAROSCOPIC RECTOPEXY WITH SIGMOID COLECTOMY (N/A)  Start clear liquid diet PT consulting on Leave drain D/c foley  LOS: 5 days    Isabel Ardila A 07/02/2013

## 2013-07-02 NOTE — Progress Notes (Signed)
[facility] PROGRESS NOTE  Erika Whitney QZE:[id number] DOB: 1924-08-04 DOA: 06/27/2013 PCP: Stephens Shire, MD  Brief summary  80 year old female with a past medical history of hypothyroidism, dyslipidemia, anxiety, rectal prolapse who presented to [facility] ED 06/27/2013 with complaints of blood in stool ongoing for past several weeks without associated lightheadedness or loss of consciousness. Pt did not have previous evaluation with colonoscopy. No associated abdominal pain, nausea or vomiting. She is on aspirin and naproxen. No fever or chills. No chest pain, shortness of breath or palpitations.  Pt was hemodynamically stable on the admission but had visible blood on rectal exam.  She had rectal prolapse while sitting on toilet which likely led to vasovagal syncope and possible asystole vs. Severe bradycardia.  Chest compressions were done for 30 seconds with ROSC and spontaneous breathing.  She did not require intubation, shock, or administration of medications.  Colonoscopy on 12/31 confirmed rectal prolapse.  To OR for rectopexy on 06/30/2013  Assessment/Plan  Rectal prolapse with GI bleed. Appreciate gastroenterology assistance. Colonoscopy verified rectal prolapse on 12/31. -  Management per General surgery -  Continue to hold aspirin and Naprosyn -  D/c IV Protonix -  Continue prn vicodin  Severe vasovagal syncope and hypotension due to rectal prolapse.  Felt faint while sitting on toilet again.  -  Minimize medications that will slow AV conduction -  Troponins neg -  Telemetry: Sinus rhythm -  ECHo demonstrates preserved ejection fraction without wall motion abnormalities, grade 1 diastolic dysfunction -  Orthostatics:  negative  Elevated blood pressures likely due to pain and somewhat labile.   -  Continue prn hydralazine -  Avoid medications which can slow HR/AV node conduction -  Recommend outpatient follow up   Dyslipidemia, stable, restart statin.   Hypothyroidism,  restart oral synthroid  -   TSH 1.828, at goal  Anxiety and depression stable, restart clonazepam and celexa.  D/c prn ativan tomorrow morning when clonazepam restarts  Dementia stable, restart aricept  Neuropathic pain stable, restart lyrica   Normocytic anemia likely due to GIB -  Repeat CBC as outpatient in a few weeks and if still anemic, consider iron studies/supplementation  Diet:  Per surgery Access:  PIV IVF:  yes Proph:  SCD  Code Status: Full Family Communication: Patient and her son Disposition Plan:  PTOT ordered   Consultants:  Gastroenterology, Doctor Benson Norway  General surgery, Dr. Marcello Moores  Procedures:  Chest x-ray  Colonoscopy on 12/31  Resection and rectopexy 06/30/2013  Antibiotics:  None   HPI/Subjective:  Patient states that she feels well.   Passing lots of gas.  abd less sore  Objective: Filed Vitals:   07/02/13 1039 07/02/13 1041 07/02/13 1043 07/02/13 1400  BP: 130/56 134/44 133/47 151/68  Pulse: 76 81 79 66  Temp:    98.4 F (36.9 C)  TempSrc:    Oral  Resp: 18 18 18 18   Height:      Weight:      SpO2: 98% 100% 99% 95%    Intake/Output Summary (Last 24 hours) at 07/02/13 1833 Last data filed at 07/02/13 1800  Gross per 24 hour  Intake    600 ml  Output   2675 ml  Net  -2075 ml   Filed Weights   06/30/13 0000 07/01/13 0400 07/02/13 0514  Weight: 50.4 kg (111 lb 1.8 oz) 50.4 kg (111 lb 1.8 oz) 56.4 kg (124 lb 5.4 oz)    Exam:   General:  Caucasian female, No  acute distress  HEENT:  NCAT, MMM  Cardiovascular:  RRR, nl S1, S2 no mrg, 2+ pulses, warm extremities  Respiratory:  CTAB, no increased WOB  Abdomen:   NABS, ND, mildly TTP diffusely without rebound or guarding.  Incisions appear c/d/i.  MSK:   Normal tone and bulk, no LEE  Neuro:  Grossly intact  Data Reviewed: Basic Metabolic Panel:  Recent Labs Lab 06/27/13 1218 06/27/13 1740  06/28/13 0345 06/29/13 0330 06/30/13 0340 07/01/13 0500 07/02/13 0540   NA 137 137  < > 139 139 136* 138 137  K 4.5 4.6  < > 4.1 3.7 3.9 3.7 3.9  CL 98 98  < > 102 103 103 102 100  CO2 30 30  < > 25 28 23 27 27   GLUCOSE 85 131*  < > 104* 100* 118* 124* 126*  BUN 18 15  < > 10 7 10 7 6   CREATININE 0.82 0.80  < > 0.68 0.80 0.93 0.83 0.81  CALCIUM 8.7 8.6  < > 8.5 8.7 8.5 7.8* 8.2*  MG  --  2.0  --   --   --   --   --   --   PHOS  --  4.1  --   --   --   --   --   --   < > = values in this interval not displayed. Liver Function Tests:  Recent Labs Lab 06/27/13 1740 06/28/13 0345  AST 19 28  ALT 19 21  ALKPHOS 75 84  BILITOT <0.2* 0.2*  PROT 5.9* 6.5  ALBUMIN 3.0* 3.2*   No results found for this basename: LIPASE, AMYLASE,  in the last 168 hours No results found for this basename: AMMONIA,  in the last 168 hours CBC:  Recent Labs Lab 06/27/13 1218 06/27/13 1740  06/28/13 0345 06/29/13 0330 06/30/13 0340 07/01/13 0500 07/02/13 0540  WBC 7.4 7.6  < > 9.4 8.3 8.7 7.4 8.0  NEUTROABS 3.8 3.7  --   --   --   --   --   --   HGB 11.9* 11.3*  < > 12.5 11.0* 10.6* 10.7* 10.4*  HCT 34.7* 33.7*  < > 37.8 33.1* 31.2* 31.9* 30.1*  MCV 92.8 93.4  < > 93.6 92.5 91.8 92.2 90.7  PLT 229 235  < > 224 209 210 211 238  < > = values in this interval not displayed. Cardiac Enzymes:  Recent Labs Lab 06/27/13 1940 06/28/13 0110 06/28/13 0927  TROPONINI <0.30 <0.30 <0.30   BNP (last 3 results) No results found for this basename: PROBNP,  in the last 8760 hours CBG:  Recent Labs Lab 06/30/13 0732 06/30/13 0854 07/01/13 0729 07/01/13 1154 07/02/13 0815  GLUCAP 89 95 94 124* 101*    Recent Results (from the past 240 hour(s))  MRSA PCR SCREENING     Status: None   Collection Time    06/27/13  8:17 PM      Result Value Range Status   MRSA by PCR NEGATIVE  NEGATIVE Final   Comment:            The GeneXpert MRSA Assay (FDA     approved for NASAL specimens     only), is one component of a     comprehensive MRSA colonization     surveillance  program. It is not     intended to diagnose MRSA     infection nor to guide or     monitor treatment for  MRSA infections.  SURGICAL PCR SCREEN     Status: None   Collection Time    06/30/13  8:39 AM      Result Value Range Status   MRSA, PCR NEGATIVE  NEGATIVE Final   Staphylococcus aureus NEGATIVE  NEGATIVE Final   Comment:            The Xpert SA Assay (FDA     approved for NASAL specimens     in patients over 47 years of age),     is one component of     a comprehensive surveillance     program.  Test performance has     been validated by [employer] for patients greater     than or equal to 43 year old.     It is not intended     to diagnose infection nor to     guide or monitor treatment.     Studies: No results found.  Scheduled Meds: . enoxaparin (LOVENOX) injection  40 mg Subcutaneous Q24H  . levothyroxine  50 mcg Intravenous Daily  . pantoprazole (PROTONIX) IV  40 mg Intravenous Q12H   Continuous Infusions: . dextrose 5 % and 0.45 % NaCl with KCl 20 mEq/L 50 mL/hr at 06/30/13 1638    Principal Problem:   GI bleed Active Problems:   Rectal bleed   Dyslipidemia   Hypothyroidism   Anxiety and depression   Dementia   Neuropathic pain   Rectal prolapse   Vasovagal syncope    Time spent: 30 min    Erika Whitney, [place] [facility] Pager [phone]. If 7PM-7AM, please contact night-coverage at [email or web address], password [facility] 07/02/2013, 6:33 PM  LOS: 5 days

## 2013-07-03 ENCOUNTER — Encounter (HOSPITAL_COMMUNITY): Payer: Self-pay | Admitting: General Surgery

## 2013-07-03 LAB — GLUCOSE, CAPILLARY: GLUCOSE-CAPILLARY: 113 mg/dL — AB (ref 70–99)

## 2013-07-03 MED ORDER — ENSURE PUDDING PO PUDG
1.0000 | Freq: Three times a day (TID) | ORAL | Status: DC
  Administered 2013-07-03 – 2013-07-09 (×16): 1 via ORAL
  Filled 2013-07-03 (×21): qty 1

## 2013-07-03 NOTE — Progress Notes (Signed)
Physical Therapy Treatment Patient Details Name: Erika Whitney MRN: [id number] DOB: 09/01/1924 Today's Date: 07/03/2013 Time: 1100-1130 PT Time Calculation (min): 30 min  PT Assessment / Plan / Recommendation  History of Present Illness pt was admitted for GI Bleed. She is s/p colonoscopy, s/p laprascopic rectoplexy    PT Comments   Pt reports that her daughter should be able to stay with pt. Pt amb. X 400' with no assistive device, had BM. Pt will benefit from HHPT at [facility].  Follow Up Recommendations  Home health PT     Does the patient have the potential to tolerate intense rehabilitation     Barriers to Discharge        Equipment Recommendations  None recommended by PT    Recommendations for Other Services    Frequency Min 3X/week   Progress towards PT Goals Progress towards PT goals: Progressing toward goals  Plan Discharge plan needs to be updated;Current plan remains appropriate    Precautions / Restrictions Precautions Precautions: Fall Restrictions Weight Bearing Restrictions: No   Pertinent Vitals/Pain none    Mobility  Bed Mobility Supine to Sit: 7: Independent Transfers Sit to Stand: 5: Supervision;From bed;From chair/3-in-1;With upper extremity assist Stand to Sit: 5: Supervision;To chair/3-in-1;With upper extremity assist Ambulation/Gait Ambulation/Gait Assistance: 4: Min guard;5: Supervision Ambulation Distance (Feet): 400 Feet Ambulation/Gait Assistance Details: pt did not use  support throughout ambulation until walk intio bathroom where she held onto foot of the bed lightlt. gait is slow and steady. Gait Pattern: Within Functional Limits    Exercises     PT Diagnosis:    PT Problem List:   PT Treatment Interventions:     PT Goals (current goals can now be found in the care plan section)    Visit Information  Last PT Received On: 07/03/13 Assistance Needed: +1 History of Present Illness: pt was admitted for GI Bleed. She is s/p colonoscopy,  s/p laprascopic rectoplexy     Subjective Data      Cognition  Cognition Arousal/Alertness: Awake/alert    Balance  Static Standing Balance Static Standing - Balance Support: No upper extremity supported;During functional activity Static Standing - Level of Assistance: 5: Stand by assistance Dynamic Standing Balance Dynamic Standing - Balance Support: During functional activity Dynamic Standing - Level of Assistance: 5: Stand by assistance Dynamic Standing - Comments: Pt able to perform self peri care after toileting, able to lean forward.  End of Session PT - End of Session Activity Tolerance: Patient tolerated treatment well Patient left: in chair;with call bell/phone within reach Nurse Communication:  (pt had BM)   GP     Claretha Cooper 07/03/2013, 11:37 AM Tresa Endo PT [phone]

## 2013-07-03 NOTE — Progress Notes (Signed)
[facility] PROGRESS NOTE  Erika Whitney EYC:[id number] DOB: 1924-08-10 DOA: 06/27/2013 PCP: Stephens Shire, MD   Outpatient follow up: Follow up with PCP in 2 weeks for repeat CBC and BP check.     Assessment/Plan  Rectal prolapse with GI bleed.  Colonoscopy verified rectal prolapse on 12/31.  Resection and rectopexy on 1/2, doing well.   -  Management per General surgery -  Aspirin may be restarted as outpatient  Severe vasovagal syncope and hypotension due to rectal prolapse.  Troponins negative and heart rate remained NSR.  ECHO demonstrated preserved ejection fraction without wall motion abnormalities, grade 1 diastolic dysfunction.  She was seen by cardiology who agreed her symptoms were likely vasovagal.  She may be prone to further vagal episodes with stools so recommend trying to keep stools soft so she does not have to strain.  Her orthostatics were negative.    Elevated blood pressures likely due to pain and somewhat labile.   -  Recommend outpatient follow up for blood pressure in 2 weeks.    Normocytic anemia likely due to GIB.   -  Repeat CBC as outpatient in a few weeks and if still anemic, consider iron studies/supplementation  Dyslipidemia, stable, continue statin  Hypothyroidism, TSH 1.828, continue synthroid Anxiety and depression stable, continue clonazepam and celexa Dementia stable, continue aricept Neuropathic pain stable, continue lyrica    Appreciate this consultation.  Patient is doing well post-operatively and anticipate discharge to home in a few days after she has advanced her diet.  Her medical problems appear stable at this time.  Will sign off.  Please reconsult if needed.     Consultants:  Gastroenterology, Doctor Benson Norway  General surgery, Dr. Marcello Moores  Procedures:  Chest x-ray  Colonoscopy on 12/31  Resection and rectopexy 06/30/2013  Antibiotics:  None   HPI/Subjective:  Patient states that she feels tired because she did not  sleep well.   Passing less gas.  abd less sore  Objective: Filed Vitals:   07/02/13 1400 07/02/13 2120 07/03/13 0530 07/03/13 1411  BP: 151/68 136/54 158/46 112/70  Pulse: 66 64 62 63  Temp: 98.4 F (36.9 C) 97.6 F (36.4 C) 97.8 F (36.6 C) 98 F (36.7 C)  TempSrc: Oral Oral Oral Oral  Resp: 18 18 16 16   Height:      Weight:   55.8 kg (123 lb 0.3 oz)   SpO2: 95% 96% 99% 97%    Intake/Output Summary (Last 24 hours) at 07/03/13 1829 Last data filed at 07/03/13 1829  Gross per 24 hour  Intake   1516 ml  Output   2625 ml  Net  -1109 ml   Filed Weights   07/01/13 0400 07/02/13 0514 07/03/13 0530  Weight: 50.4 kg (111 lb 1.8 oz) 56.4 kg (124 lb 5.4 oz) 55.8 kg (123 lb 0.3 oz)    Exam:   General:  Caucasian female, No acute distress, tired appearing  HEENT:  NCAT, MMM  Cardiovascular:  RRR, nl S1, S2 no mrg, 2+ pulses, warm extremities  Respiratory:  CTAB, no increased WOB  Abdomen:   NABS, ND, mildly TTP diffusely without rebound or guarding.  Incisions appear c/d/i.  MSK:   Normal tone and bulk, no LEE  Neuro:  Grossly intact  Data Reviewed: Basic Metabolic Panel:  Recent Labs Lab 06/27/13 1218 06/27/13 1740  06/28/13 0345 06/29/13 0330 06/30/13 0340 07/01/13 0500 07/02/13 0540  NA 137 137  < > 139 139 136* 138 137  K 4.5 4.6  < >  4.1 3.7 3.9 3.7 3.9  CL 98 98  < > 102 103 103 102 100  CO2 30 30  < > 25 28 23 27 27   GLUCOSE 85 131*  < > 104* 100* 118* 124* 126*  BUN 18 15  < > 10 7 10 7 6   CREATININE 0.82 0.80  < > 0.68 0.80 0.93 0.83 0.81  CALCIUM 8.7 8.6  < > 8.5 8.7 8.5 7.8* 8.2*  MG  --  2.0  --   --   --   --   --   --   PHOS  --  4.1  --   --   --   --   --   --   < > = values in this interval not displayed. Liver Function Tests:  Recent Labs Lab 06/27/13 1740 06/28/13 0345  AST 19 28  ALT 19 21  ALKPHOS 75 84  BILITOT <0.2* 0.2*  PROT 5.9* 6.5  ALBUMIN 3.0* 3.2*   No results found for this basename: LIPASE, AMYLASE,  in the last  168 hours No results found for this basename: AMMONIA,  in the last 168 hours CBC:  Recent Labs Lab 06/27/13 1218 06/27/13 1740  06/28/13 0345 06/29/13 0330 06/30/13 0340 07/01/13 0500 07/02/13 0540  WBC 7.4 7.6  < > 9.4 8.3 8.7 7.4 8.0  NEUTROABS 3.8 3.7  --   --   --   --   --   --   HGB 11.9* 11.3*  < > 12.5 11.0* 10.6* 10.7* 10.4*  HCT 34.7* 33.7*  < > 37.8 33.1* 31.2* 31.9* 30.1*  MCV 92.8 93.4  < > 93.6 92.5 91.8 92.2 90.7  PLT 229 235  < > 224 209 210 211 238  < > = values in this interval not displayed. Cardiac Enzymes:  Recent Labs Lab 06/27/13 1940 06/28/13 0110 06/28/13 0927  TROPONINI <0.30 <0.30 <0.30   BNP (last 3 results) No results found for this basename: PROBNP,  in the last 8760 hours CBG:  Recent Labs Lab 06/30/13 0854 07/01/13 0729 07/01/13 1154 07/02/13 0815 07/03/13 0739  GLUCAP 95 94 124* 101* 113*    Recent Results (from the past 240 hour(s))  MRSA PCR SCREENING     Status: None   Collection Time    06/27/13  8:17 PM      Result Value Range Status   MRSA by PCR NEGATIVE  NEGATIVE Final   Comment:            The GeneXpert MRSA Assay (FDA     approved for NASAL specimens     only), is one component of a     comprehensive MRSA colonization     surveillance program. It is not     intended to diagnose MRSA     infection nor to guide or     monitor treatment for     MRSA infections.  SURGICAL PCR SCREEN     Status: None   Collection Time    06/30/13  8:39 AM      Result Value Range Status   MRSA, PCR NEGATIVE  NEGATIVE Final   Staphylococcus aureus NEGATIVE  NEGATIVE Final   Comment:            The Xpert SA Assay (FDA     approved for NASAL specimens     in patients over 96 years of age),     is one component of     a comprehensive  surveillance     program.  Test performance has     been validated by [facility] for patients greater     than or equal to 31 year old.     It is not intended     to diagnose infection nor  to     guide or monitor treatment.     Studies: No results found.  Scheduled Meds: . calcium-vitamin D  2 tablet Oral Q breakfast  . citalopram  20 mg Oral Daily  . clonazePAM  0.5 mg Oral Daily  . donepezil  10 mg Oral QHS  . enoxaparin (LOVENOX) injection  40 mg Subcutaneous Q24H  . feeding supplement (ENSURE)  1 Container Oral TID BM  . levothyroxine  112.5 mcg Oral QAC breakfast  . loratadine  10 mg Oral Daily  . mirtazapine  15 mg Oral QHS  . pregabalin  100 mg Oral Daily  . simvastatin  20 mg Oral q1800   Continuous Infusions: . dextrose 5 % and 0.45 % NaCl with KCl 20 mEq/L 20 mL/hr (07/03/13 1400)    Principal Problem:   GI bleed Active Problems:   Rectal bleed   Dyslipidemia   Hypothyroidism   Anxiety and depression   Dementia   Neuropathic pain   Rectal prolapse   Vasovagal syncope    Time spent: 30 min    Kaleesi Guyton, [place] [facility] Pager [phone]. If 7PM-7AM, please contact night-coverage at [email or web address], password [facility] 07/03/2013, 6:29 PM  LOS: 6 days

## 2013-07-03 NOTE — Progress Notes (Signed)
ATTENDING ADDENDUM:  I personally reviewed patient's record, examined the patient, and formulated the following assessment and plan:  Doing well.  Having flatus but no BM yet.  No nausea.  Agree with full liquids.  Ambulate.  Cont drain until BM

## 2013-07-03 NOTE — Progress Notes (Signed)
3 Days Post-Op  Subjective: Pt alert, says had some clears, but does not remember what she has had.  Also says she cannot remember having BM.  She lives alone normally with someone who comes in to help. Objective: Vital signs in last 24 hours: Temp:  [97.6 F (36.4 C)-99.3 F (37.4 C)] 97.8 F (36.6 C) (01/05 0530) Pulse Rate:  [62-81] 62 (01/05 0530) Resp:  [16-18] 16 (01/05 0530) BP: (130-158)/(44-68) 158/46 mmHg (01/05 0530) SpO2:  [95 %-100 %] 99 % (01/05 0530) Last BM Date: 07/01/13 Diet: clears (PO not recorded) BM reported last 2 days. Afebrile, VSS Labs OK yesterday Intake/Output from previous day: 01/04 0701 - 01/05 0700 In: 1200 [I.V.:1200] Out: 2640 [Urine:2550; Drains:90] Intake/Output this shift:    General appearance: alert, cooperative and no distress Resp: clear to auscultation bilaterally GI: soft, with BS, incisions look good.  drainage from drain is clear.  rectum looks normal.  Lab Results:   Recent Labs  07/01/13 0500 07/02/13 0540  WBC 7.4 8.0  HGB 10.7* 10.4*  HCT 31.9* 30.1*  PLT 211 238    BMET  Recent Labs  07/01/13 0500 07/02/13 0540  NA 138 137  K 3.7 3.9  CL 102 100  CO2 27 27  GLUCOSE 124* 126*  BUN 7 6  CREATININE 0.83 0.81  CALCIUM 7.8* 8.2*   PT/INR No results found for this basename: LABPROT, INR,  in the last 72 hours   Recent Labs Lab 06/27/13 1740 06/28/13 0345  AST 19 28  ALT 19 21  ALKPHOS 75 84  BILITOT <0.2* 0.2*  PROT 5.9* 6.5  ALBUMIN 3.0* 3.2*     Lipase  No results found for this basename: lipase     Studies/Results: No results found.  Medications: . calcium-vitamin D  2 tablet Oral Q breakfast  . citalopram  20 mg Oral Daily  . clonazePAM  0.5 mg Oral Daily  . donepezil  10 mg Oral QHS  . enoxaparin (LOVENOX) injection  40 mg Subcutaneous Q24H  . levothyroxine  112.5 mcg Oral QAC breakfast  . loratadine  10 mg Oral Daily  . mirtazapine  15 mg Oral QHS  . pregabalin  100 mg Oral Daily   . simvastatin  20 mg Oral q1800   . dextrose 5 % and 0.45 % NaCl with KCl 20 mEq/L 50 mL/hr at 07/03/13 0430   Prior to Admission medications   Medication Sig Start Date End Date Taking? Authorizing Provider  aspirin EC 81 MG tablet Take 81 mg by mouth daily.   Yes Historical Provider, MD  calcium-vitamin D (OSCAL WITH D) 500-200 MG-UNIT per tablet Take 2 tablets by mouth daily.   Yes Historical Provider, MD  citalopram (CELEXA) 20 MG tablet Take 20 mg by mouth daily.   Yes Historical Provider, MD  clonazePAM (KLONOPIN) 0.5 MG tablet Take 0.5 mg by mouth daily.   Yes Historical Provider, MD  donepezil (ARICEPT) 10 MG tablet Take 10 mg by mouth at bedtime.   Yes Historical Provider, MD  glucosamine-chondroitin 500-400 MG tablet Take 1 tablet by mouth 2 (two) times daily.   Yes Historical Provider, MD  levothyroxine (SYNTHROID, LEVOTHROID) 75 MCG tablet Take 112.5 mcg by mouth daily before breakfast. Takes one and one half tablets to = 112.5mg    Yes Historical Provider, MD  loratadine (CLARITIN) 10 MG tablet Take 10 mg by mouth daily as needed for allergies (allergies).   Yes Historical Provider, MD  mirtazapine (REMERON) 15 MG tablet Take  15 mg by mouth at bedtime.   Yes Historical Provider, MD  Multiple Vitamins-Minerals (MULTIVITAMIN PO) Take 1 tablet by mouth daily.   Yes Historical Provider, MD  naproxen sodium (ANAPROX) 220 MG tablet Take 220 mg by mouth 2 (two) times daily as needed (pain).   Yes Historical Provider, MD  Omega-3 Fatty Acids (FISH OIL) 1000 MG CAPS Take 1 capsule by mouth daily.   Yes Historical Provider, MD  pregabalin (LYRICA) 100 MG capsule Take 100 mg by mouth daily.   Yes Historical Provider, MD  simvastatin (ZOCOR) 10 MG tablet Take 10 mg by mouth daily.   Yes Historical Provider, MD     Assessment/Plan Rectal prolapse with GI bleed;  S/p LAPAROSCOPIC RECTOPEXY WITH SIGMOID COLECTOMY, 00/86/7619,  Leighton Ruff, MD. Colonoscopy with snare Polypectomy 06/28/13,  Dr. Carol Ada: 1) Rectal prolapse with resultant mucosal ulceration and bleeding. 2) Colonic polyps. 3) Diverticula. Consult:  Ottie Glazier MD, Cardiology 06/29/13. Severe vasovagal syncope and hypotension due to rectal prolapse (Code Blue called 06/27/13) Hypertension Dyslipidemia Hypothyroidism Anxiety/depression/dementia Hx of Breast Cancer She is not on antibiotics.   Plan: She says she walked some yesterday, memory is an issue.  She cannot remember her PCP, or much about her day yesterday.  I will advance her to full liquids, as OT/PT/Case management to see.  Appreciate Dr. Zettie Pho assistance.  LOS: 6 days    Seaira Byus 07/03/2013

## 2013-07-04 LAB — GLUCOSE, CAPILLARY: Glucose-Capillary: 81 mg/dL (ref 70–99)

## 2013-07-04 MED ORDER — HYDROCODONE-ACETAMINOPHEN 5-325 MG PO TABS: 1.0000 | ORAL_TABLET | ORAL | Status: DC | PRN

## 2013-07-04 MED ORDER — DSS 100 MG PO CAPS: ORAL_CAPSULE | ORAL | Status: DC

## 2013-07-04 MED ORDER — DOCUSATE SODIUM 100 MG PO CAPS
100.0000 mg | ORAL_CAPSULE | Freq: Every day | ORAL | Status: DC
  Administered 2013-07-04 – 2013-07-09 (×6): 100 mg via ORAL
  Filled 2013-07-04 (×6): qty 1

## 2013-07-04 MED ORDER — ENSURE PUDDING PO PUDG: ORAL | Status: DC

## 2013-07-04 NOTE — Discharge Summary (Signed)
Physician Discharge Summary  Patient ID: Erika Whitney MRN: [id number] DOB/AGE: 02/08/1925 80 y.o.  Admit date: 06/27/2013 Discharge date: 07/09/2013  Admission Diagnoses:  Rectal prolapse with GI bleed Dementia with poor memory Hypertension  Dyslipidemia  Hypothyroidism  Anxiety/depression/dementia  Hx of Breast Cancer   Discharge Diagnoses:   Rectal prolapse with GI bleed Dementia with poor memory Severe vasovagal syncope and hypotension due to rectal prolapse (Code Blue called 06/27/13) Hypertension  Dyslipidemia  Hypothyroidism  Anxiety/depression/dementia  Hx of Breast Cancer   Principal Problem:   GI bleed Active Problems:   Rectal bleed   Dyslipidemia   Hypothyroidism   Anxiety and depression   Dementia   Neuropathic pain   Rectal prolapse   Vasovagal syncope   PROCEDURES:  Rectal prolapse with GI bleed; S/p LAPAROSCOPIC RECTOPEXY WITH SIGMOID COLECTOMY, 07/01/7251, Leighton Ruff, MD.  Colonoscopy with snare Polypectomy 06/28/13, Dr. Carol Ada: 1) Rectal prolapse with resultant mucosal ulceration and bleeding. 2) Colonic polyps. 3) Diverticula.    Hospital Course: This is an 80 year old female with a PMH of rectal prolapse, minimal dementia, and history of breast cancer who is admitted for hematochezia. The patient's bleeding started earlier today and she had a total of 3 bouts of bleeding witnessed by family at home and by the ER staff. Last Thanksgiving she had an issue with rectal prolapse, but this resolved without any issue. No further reports of prolapse over the past year. She did have issues with hematochezia for the past several weeks, but she did not tell any of her family members. This was subsequently discovered by her daughter, which prompted an outpatient consultation. No complaints of any abdominal pain, nausea, vomiting, fever, and there is no known family history of colon cancer. She has never had a colonoscopy.  She was admitted with  hematochezia.  She was scheduled for colonoscopy on 12/31, on the evening of the 30th she had a syncopal episode while on the toilet.  Code Blue was called and CPR with compressions started, she responded in 30 sec and CPR was discontinued.  She was bradycardic/hypotensive and responded with time and fluid. She was transferred to the ICU. She was doing well and underwent colonoscopy the following day.  She was seen by Cardiology and Dr. Ena Dawley on 12/31, it was her opinion the pt had a vasovagal syncopal episode.  2D echo was normal and she had no arrythmia's.  Cardiology cleared her for surgery.  She underwent colonoscopy with above noted results. She was evaluated by Dr. Leighton Ruff on 66/44/03.  She recommended surgery and was taken to the OR on 06/30/13. Post op she has done well, her diet has been advanced and she is ready for discharge  Her dementia has remained and she cannot remember much, but she has overall done very well. She had some ileus,post op, but not bad.We advanced her diet, her family was with her almost daily and we were ready to send her home on 07/05/13.  She developed some nausea and vomited later that day.  i repeated her abdominal films and she had some SB dilatation.  We back her diet up to full liquids, and she had no further issues over the next 48 hours and she was moved back up to low fiber. She was ultimately discharged on 07/09/13 Follow up with Dr. Marcello Moores in 2 weeks as noted below. Her family remains with her and will take care of her after discharge.   Disposition: 01-Home or Self Care  Discharge Orders   Future Appointments Provider Department Dept Phone   3/50/0938 1:82 PM Leighton Ruff, MD [facility], [place] [phone]   Future Orders Complete By Expires   Care order/instruction  As directed    Scheduling Instructions:     You can restart the vitamins and supplements in 7 days after she has been on a regular diet and is eating well.        Medication List         aspirin EC 81 MG tablet  Take 81 mg by mouth daily.     calcium-vitamin D 500-200 MG-UNIT per tablet  Commonly known as:  OSCAL WITH D  Take 2 tablets by mouth daily. (restart in about 7 days after she is eating well.)     citalopram 20 MG tablet  Commonly known as:  CELEXA  Take 20 mg by mouth daily.     clonazePAM 0.5 MG tablet  Commonly known as:  KLONOPIN  Take 0.5 mg by mouth daily.     donepezil 10 MG tablet  Commonly known as:  ARICEPT  Take 10 mg by mouth at bedtime.     DSS 100 MG Caps  Take one or two daily as needed to keep stools soft.  If she is having no problem with constipation or stool is to loose, you can discontinue.     feeding supplement (ENSURE) Pudg  She can take this 3-4 times per day.  She can also use any other supplement she enjoys.     Fish Oil 1000 MG Caps  Take 1 capsule by mouth daily.   (restart in about 7 days after she is eating well.)     glucosamine-chondroitin 500-400 MG tablet  Take 1 tablet by mouth 2 (two) times daily.     HYDROcodone-acetaminophen 5-325 MG per tablet  Commonly known as:  NORCO/VICODIN  Take 1-2 tablets by mouth every 4 (four) hours as needed for moderate pain.     levothyroxine 75 MCG tablet  Commonly known as:  SYNTHROID, LEVOTHROID  Take 112.5 mcg by mouth daily before breakfast. Takes one and one half tablets to = 112.5mg      loratadine 10 MG tablet  Commonly known as:  CLARITIN  Take 10 mg by mouth daily as needed for allergies (allergies).     mirtazapine 15 MG tablet  Commonly known as:  REMERON  Take 15 mg by mouth at bedtime.     MULTIVITAMIN PO  Take 1 tablet by mouth daily.   (restart in about 7 days after she is eating well.)     naproxen sodium 220 MG tablet  Commonly known as:  ANAPROX  Take 220 mg by mouth 2 (two) times daily as needed (pain).     pregabalin 100 MG capsule  Commonly known as:  LYRICA  Take 100 mg by mouth daily.     simvastatin 10 MG tablet   Commonly known as:  ZOCOR  Take 10 mg by mouth daily.       Follow-up Information   Schedule an appointment as soon as possible for a visit with BURNETT,BRENT A, MD. (Make an appointment in 1-2 weeks so he can see her and review her medicines and current outpatient treatments.)    Specialty:  Family Medicine   Contact information:   [address] [place] [address] [phone]       Follow up with Rosario Adie., MD. Schedule an appointment as soon as possible  for a visit in 2 weeks.   Specialty:  General Surgery   Contact information:   [address] [place] [address] [phone]       Signed: Earnstine Regal 07/10/2013, 3:11 PM

## 2013-07-04 NOTE — Progress Notes (Signed)
Occupational Therapy Treatment Patient Details Name: Erika Whitney MRN: [id number] DOB: 1925-01-12 Today's Date: 07/04/2013 Time: 0720-0731 OT Time Calculation (min): 11 min  OT Assessment / Plan / Recommendation  History of present illness pt was admitted for GI Bleed. She is s/p colonoscopy, s/p laprascopic rectoplexy    OT comments  Awake and agreeable to participation in skilled ot.  Able to complete all aspects of toileting with s/min guard a.  Intermittent furniture walking/HHA.  No LOB noted.    Follow Up Recommendations  Home health OT;SNF                      Frequency Min 2X/week   Progress towards OT Goals Progress towards OT goals: Progressing toward goals  Plan Discharge plan remains appropriate    Precautions / Restrictions Precautions Precautions: Fall   Pertinent Vitals/Pain 1/10, states she feels well    ADL  Lower Body Dressing: Performed;Supervision/safety Where Assessed - Lower Body Dressing: Unsupported sitting Toilet Transfer: Performed;Min guard Toilet Transfer Method: Sit to stand;Stand pivot Toilet Transfer Equipment: Regular height toilet;Grab bars Toileting - Clothing Manipulation and Hygiene: Performed;Supervision/safety Where Assessed - Camera operator Manipulation and Hygiene: Sit on 3-in-1 or toilet Transfers/Ambulation Related to ADLs: int. furniture walking from eob to/from b.room to recliner ADL Comments: very motivated, ablet to don socks set up un supported sitting, all aspects of toileting s/min guard a      OT Goals(current goals can now be found in the care plan section)    Visit Information  Last OT Received On: 07/04/13 History of Present Illness: pt was admitted for GI Bleed. She is s/p colonoscopy, s/p laprascopic rectoplexy                  Cognition  Cognition Arousal/Alertness: Awake/alert Overall Cognitive Status: Within Functional Limits for tasks assessed    Mobility  Bed Mobility Details for Bed  Mobility Assistance: mod I for all aspects of bed mobility Transfers Transfers: Sit to Stand;Stand to Sit Sit to Stand: 5: Supervision;From bed Stand to Sit: 5: Supervision;To bed;To chair/3-in-1               End of Session OT - End of Session Activity Tolerance: Patient tolerated treatment well Patient left: in chair;with call bell/phone within reach       Janice Coffin, COTA/L 07/04/2013, 7:35 AM

## 2013-07-04 NOTE — Progress Notes (Signed)
4 Days Post-Op  Subjective: Sitting up in chair, looks good and appears to feel good.  Does not remember to much about BM, says it was loose, but didn't remember when I first ask her. She is ready for more food.  Objective: Vital signs in last 24 hours: Temp:  [97.9 F (36.6 C)-99.2 F (37.3 C)] 97.9 F (36.6 C) (01/06 0509) Pulse Rate:  [63-73] 66 (01/06 0509) Resp:  [16-19] 19 (01/06 0509) BP: (112-142)/(43-70) 142/52 mmHg (01/06 0509) SpO2:  [95 %-98 %] 98 % (01/06 0509) Weight:  [55.6 kg (122 lb 9.2 oz)] 55.6 kg (122 lb 9.2 oz) (01/06 0429) Last BM Date: 07/04/13 PO 1060 ml reported Drain 30 ml,  3 stools reported Tm 99.2, VSS No labs Intake/Output from previous day: 01/05 0701 - 01/06 0700 In: 6767 [P.O.:1060; I.V.:587] Out: 2582 [Urine:2550; Drains:30; Stool:2] Intake/Output this shift:    General appearance: alert, cooperative and no distress Resp: clear to auscultation bilaterally GI: soft,nontender, incision looks great.  drainage is clear after BM.  Lab Results:   Recent Labs  07/02/13 0540  WBC 8.0  HGB 10.4*  HCT 30.1*  PLT 238    BMET  Recent Labs  07/02/13 0540  NA 137  K 3.9  CL 100  CO2 27  GLUCOSE 126*  BUN 6  CREATININE 0.81  CALCIUM 8.2*   PT/INR No results found for this basename: LABPROT, INR,  in the last 72 hours   Recent Labs Lab 06/27/13 1740 06/28/13 0345  AST 19 28  ALT 19 21  ALKPHOS 75 84  BILITOT <0.2* 0.2*  PROT 5.9* 6.5  ALBUMIN 3.0* 3.2*     Lipase  No results found for this basename: lipase     Studies/Results: No results found.  Medications: . calcium-vitamin D  2 tablet Oral Q breakfast  . citalopram  20 mg Oral Daily  . clonazePAM  0.5 mg Oral Daily  . donepezil  10 mg Oral QHS  . enoxaparin (LOVENOX) injection  40 mg Subcutaneous Q24H  . feeding supplement (ENSURE)  1 Container Oral TID BM  . levothyroxine  112.5 mcg Oral QAC breakfast  . loratadine  10 mg Oral Daily  . mirtazapine  15 mg  Oral QHS  . pregabalin  100 mg Oral Daily  . simvastatin  20 mg Oral q1800    Assessment/Plan Rectal prolapse with GI bleed; S/p LAPAROSCOPIC RECTOPEXY WITH SIGMOID COLECTOMY, 20/94/7096, Leighton Ruff, MD.  Colonoscopy with snare Polypectomy 06/28/13, Dr. Carol Ada: 1) Rectal prolapse with resultant mucosal ulceration and bleeding. 2) Colonic polyps. 3) Diverticula.  Consult: Ottie Glazier MD, Cardiology 06/29/13.  Severe vasovagal syncope and hypotension due to rectal prolapse (Code Blue called 06/27/13)  Hypertension  Dyslipidemia  Hypothyroidism  Anxiety/depression/dementia  Hx of Breast Cancer  She is not on antibiotics.   Plan:  Continue to mobilize, advance diet, and if she does well discharge home tomorrow. I will saline lock IV today, Ot/PT is ordered.  Recheck labs in AM  LOS: 7 days    Jareb Radoncic 07/04/2013

## 2013-07-04 NOTE — Progress Notes (Signed)
Seen and agree  

## 2013-07-05 ENCOUNTER — Inpatient Hospital Stay (HOSPITAL_COMMUNITY): Payer: Medicare Other

## 2013-07-05 LAB — COMPREHENSIVE METABOLIC PANEL
ALBUMIN: 2.5 g/dL — AB (ref 3.5–5.2)
ALK PHOS: 55 U/L (ref 39–117)
ALT: 19 U/L (ref 0–35)
AST: 23 U/L (ref 0–37)
BUN: 13 mg/dL (ref 6–23)
CHLORIDE: 98 meq/L (ref 96–112)
CO2: 32 mEq/L (ref 19–32)
Calcium: 8.8 mg/dL (ref 8.4–10.5)
Creatinine, Ser: 0.87 mg/dL (ref 0.50–1.10)
GFR calc non Af Amer: 59 mL/min — ABNORMAL LOW (ref >90)
GFR, EST AFRICAN AMERICAN: 68 mL/min — AB (ref >90)
GLUCOSE: 126 mg/dL — AB (ref 70–99)
POTASSIUM: 4.1 meq/L (ref 3.7–5.3)
SODIUM: 139 meq/L (ref 137–147)
TOTAL PROTEIN: 5.6 g/dL — AB (ref 6.0–8.3)

## 2013-07-05 LAB — CBC
HCT: 30.7 % — ABNORMAL LOW (ref 36.0–46.0)
Hemoglobin: 10.5 g/dL — ABNORMAL LOW (ref 12.0–15.0)
MCH: 31.5 pg (ref 26.0–34.0)
MCHC: 34.2 g/dL (ref 30.0–36.0)
MCV: 92.2 fL (ref 78.0–100.0)
Platelets: 336 10*3/uL (ref 150–400)
RBC: 3.33 MIL/uL — ABNORMAL LOW (ref 3.87–5.11)
RDW: 14 % (ref 11.5–15.5)
WBC: 9 10*3/uL (ref 4.0–10.5)

## 2013-07-05 MED ORDER — GLYCERIN (LAXATIVE) 2.1 G RE SUPP
1.0000 | Freq: Once | RECTAL | Status: AC
  Administered 2013-07-05: 1 via RECTAL
  Filled 2013-07-05 (×2): qty 1

## 2013-07-05 NOTE — Progress Notes (Signed)
Notified Erika Regal PA that patient and son stated that she is not feeling her usual self today, vital signs are stable and charted, patient's skin color is pale and she says she feels "yucky" and "dizzy" Neta Mends RN 07-05-2012 10:01am

## 2013-07-05 NOTE — Progress Notes (Signed)
Physical Therapy Treatment Patient Details Name: Erika Whitney MRN: [id number] DOB: 1924/09/16 Today's Date: 07/05/2013 Time: 6387-5643 PT Time Calculation (min): 25 min  PT Assessment / Plan / Recommendation  History of Present Illness pt was admitted for GI Bleed. She is s/p colonoscopy, s/p laprascopic rectoplexy    PT Comments   Pt was having a bad morning of nausea then vomiting.  Placed back on liquid diet and feeling much better this afternoon.  Assisted pt in bathroom then amb in hallway with one sitting rest break.  Amb pt w/o any AD.  Tolerated session well.  Son in room and admits he also walks pt when "she is up for it".  Pt plans to return home.   Follow Up Recommendations  Home health PT     Does the patient have the potential to tolerate intense rehabilitation     Barriers to Discharge        Equipment Recommendations  None recommended by PT    Recommendations for Other Services    Frequency Min 3X/week   Progress towards PT Goals Progress towards PT goals: Progressing toward goals  Plan      Precautions / Restrictions Precautions Precautions: Fall Restrictions Weight Bearing Restrictions: No    Pertinent Vitals/Pain No c/o pain    Mobility  Bed Mobility General bed mobility comments: Pt OOB in bathroom Transfers Overall transfer level: Needs assistance Equipment used: 1 person hand held assist Transfers: Sit to/from Stand Sit to Stand: Min guard General transfer comment: VC's for hand placement and safety with turns Ambulation/Gait Ambulation/Gait assistance: Min guard Ambulation Distance (Feet): 500 Feet Assistive device: 1 person hand held assist Gait Pattern/deviations: Step-through pattern Gait velocity: WFL slightly slower than her regular pace, pt stated.  Good alternating gait with equal steps and weight shift.  No LOB.  No c/o nausea.     PT Goals (current goals can now be found in the care plan section)    Visit Information  Last PT  Received On: 07/05/13 Assistance Needed: +1 History of Present Illness: pt was admitted for GI Bleed. She is s/p colonoscopy, s/p laprascopic rectoplexy     Subjective Data      Cognition       Balance     End of Session PT - End of Session Equipment Utilized During Treatment: Gait belt Activity Tolerance: Patient tolerated treatment well Patient left: with call bell/phone within reach;with family/visitor present   Rica Koyanagi  PTA [facility]  Acute  Rehab Pager      [phone]

## 2013-07-05 NOTE — Progress Notes (Signed)
Pt ate a good breakfast and son walked her some in the hall.  She got less than 20 feet and had to sit down, got back to room.  VSS, but she felt bad.  She has had at least one BM this AM, drainage is clear and I have removed the drain, she thinks she's better.  Son is worried since her episode after admission with vasovagal episode. She seems a little distended and has vomited now.   I am going to send her down for a 2 view film.

## 2013-07-05 NOTE — Progress Notes (Signed)
Seen and agree  

## 2013-07-05 NOTE — Care Management Note (Signed)
    Page 1 of 2   07/05/2013     1:57:39 PM   CARE MANAGEMENT NOTE 07/05/2013  Patient:  Erika Whitney, Erika Whitney   Account Number:  [id number]  Date Initiated:  06/28/2013  Documentation initiated by:  DAVIS,RHONDA  Subjective/Objective Assessment:   pt with gi bld and hypotension, iv fld support and EGD/poss syncopal episode at home     Action/Plan:   home when stable   Anticipated DC Date:  07/06/2013   Anticipated DC Plan:  HOME/SELF CARE  In-house referral  NA      DC Planning Services  CM consult      [facility] Choice  HOME HEALTH   Choice offered to / List presented to:  C-4 Adult Children   DME arranged  NA      DME agency  NA     [place] arranged  HH-2 PT      [place].   Status of service:  Completed, signed off Medicare Important Message given?  NA - LOS <3 / Initial given by admissions (If response is "NO", the following Medicare IM given date fields will be blank) Date Medicare IM given:   Date Additional Medicare IM given:    Discharge Disposition:  [place]  Per UR Regulation:  Reviewed for med. necessity/level of care/duration of stay  If discussed at [place] of Stay Meetings, dates discussed:   07/04/2013    Comments:  07-05-12 Sunday Spillers RN CM 1400 Son requested to speak with me reguarding d/c plans. Relayed to him the discussion I had with his sister, told him that I had arranged HHPT with [facility]. Tried to answer questions as best I could but many concerns he had were medical and I directed him to the attending. He was appreciative of the visit. Will continue to follow for d/c needs. Awaiting final orders.  07-04-13 Sunday Spillers RN CM 1300 Contacted patient's daughter to discuss d/c plans. Daughter was hoping patient would go to SNF short term but talked with her regarding patient fuctional level to high. Daughter concerned about memory issues and states she will look into increasing the hours for [employer] who  currently assists patient 2x/week. Daughter states she is also available to assist patient. She felt PT would be a good option but is concerned about the patients cognitive ability to do homework in between visits. [facility] for [facility] services.  [place], RN, BSN, [place] [phone] Chart Reviewed for discharge and hospital needs. Discharge needs at time of review:  None present will follow for needs. Review of patient progress due on [id number].

## 2013-07-05 NOTE — Progress Notes (Signed)
5 Days Post-Op  Subjective: She looks good, no BM, family says she's eating well.  I want her to get up and walk after breakfast.  I would like to see her have another BM before discharge.  Objective: Vital signs in last 24 hours: Temp:  [97.5 F (36.4 C)-98.8 F (37.1 C)] 97.5 F (36.4 C) (01/07 0512) Pulse Rate:  [58-76] 58 (01/07 0512) Resp:  [16-18] 16 (01/07 0512) BP: (116-146)/(68-74) 116/68 mmHg (01/07 0512) SpO2:  [96 %-97 %] 97 % (01/07 0512) Last BM Date: 07/04/13 No intake recorded yesterday 1700 urine, no bm recorded. Afebrile,VSS Labs OK Intake/Output from previous day: 01/06 0701 - 01/07 0700 In: -  Out: 9201 [Urine:1700; Drains:70] Intake/Output this shift:    General appearance: alert, cooperative and no distress Resp: clear to auscultation bilaterally GI: soft, nontender, complained of being sore yesterday, but OK after pain med.  drain clear.  Incisions look very good..  Lab Results:   Recent Labs  07/05/13 0545  WBC 9.0  HGB 10.5*  HCT 30.7*  PLT 336    BMET  Recent Labs  07/05/13 0545  NA 139  K 4.1  CL 98  CO2 32  GLUCOSE 126*  BUN 13  CREATININE 0.87  CALCIUM 8.8   PT/INR No results found for this basename: LABPROT, INR,  in the last 72 hours   Recent Labs Lab 07/05/13 0545  AST 23  ALT 19  ALKPHOS 55  BILITOT <0.2*  PROT 5.6*  ALBUMIN 2.5*     Lipase  No results found for this basename: lipase     Studies/Results: No results found.  Medications: . calcium-vitamin D  2 tablet Oral Q breakfast  . citalopram  20 mg Oral Daily  . clonazePAM  0.5 mg Oral Daily  . docusate sodium  100 mg Oral Daily  . donepezil  10 mg Oral QHS  . enoxaparin (LOVENOX) injection  40 mg Subcutaneous Q24H  . feeding supplement (ENSURE)  1 Container Oral TID BM  . levothyroxine  112.5 mcg Oral QAC breakfast  . loratadine  10 mg Oral Daily  . mirtazapine  15 mg Oral QHS  . pregabalin  100 mg Oral Daily  . simvastatin  20 mg Oral q1800     Assessment/Plan 1.  Rectal prolapse with GI bleed; S/p LAPAROSCOPIC RECTOPEXY WITH SIGMOID COLECTOMY,  00/71/2197, Leighton Ruff, MD.  2.  Colonoscopy with snare Polypectomy 06/28/13, Dr. Carol Ada: 1) Rectal prolapse with resultant mucosal  ulceration and bleeding. 2) Colonic polyps. 3) Diverticula.  3.  Consult: Ottie Glazier MD, Cardiology 06/29/13.  4.  Severe vasovagal syncope and hypotension due to rectal prolapse (Code Blue called 06/27/13)  5.  Hypertension  6.  Dyslipidemia  7.  Hypothyroidism  8.  Anxiety/depression/dementia  9.  Hx of Breast Cancer  10.  She is not on antibiotics.   Plan:  Breakfast, and walk, I would like to make sure she has another BM before discharge.  Pull drain before discharge, hope to send home later today.   LOS: 8 days    Erika Whitney 07/05/2013

## 2013-07-05 NOTE — Progress Notes (Signed)
NUTRITION FOLLOW UP  Intervention:   - Pt eating excellent, nutrition signing off   Nutrition Dx:   Inadequate oral intake related to inability to eat/GI bleed as evidenced by NPO status - resolved, diet advanced and pt eating excellent  Goal:   Pt to meet >/= 90% of their estimated nutrition needs - met   Assessment:   80 year old female with a past medical history of hypothyroidism, dyslipidemia, anxiety, rectal prolapse who presented to [facility] ED 06/27/2013 with complaints of blood in stool ongoing for past several weeks without associated lightheadedness or loss of consciousness.  12/31 - Pt currently NPO. She reports having a good appetite and eating 3 good meals daily PTA. She reports losing weight several months ago due to poor po intake but, her weight has been trending up the past few months as she has been eating much better. Encouraged PO intake once diet is advanced.   1/7 - Had laparoscopic rectopexy with sigmoid colectomy 1/2. Diet advanced to low fiber yesterday. Son reports pt has been eating 100% of meals and Ensure pudding. Educated son on low fiber diet and provided handouts with RD contact information.   Height: Ht Readings from Last 1 Encounters:  06/27/13 5' 1" (1.549 m)    Weight Status:   Wt Readings from Last 1 Encounters:  07/04/13 122 lb 9.2 oz (55.6 kg)    Re-estimated needs:  Kcal: 1250-1450  Protein: 55-65 grams  Fluid: 1.5 L/day   Skin: intact   Diet Order: Fiber Restricted   Intake/Output Summary (Last 24 hours) at 07/05/13 0917 Last data filed at 07/05/13 0820  Gross per 24 hour  Intake      0 ml  Output   2121 ml  Net  -2121 ml    Last BM: 1/6   Labs:   Recent Labs Lab 07/01/13 0500 07/02/13 0540 07/05/13 0545  NA 138 137 139  K 3.7 3.9 4.1  CL 102 100 98  CO2 27 27 32  BUN _0 CREATININE 0.83 0.81 0.87  CALCIUM 7.8* 8.2* 8.8  GLUCOSE 124* 126* 126*    CBG (last 3)   Recent Labs  07/03/13 0739 07/04/13 0814   GLUCAP 113* 81    Scheduled Meds: . calcium-vitamin D  2 tablet Oral Q breakfast  . citalopram  20 mg Oral Daily  . clonazePAM  0.5 mg Oral Daily  . docusate sodium  100 mg Oral Daily  . donepezil  10 mg Oral QHS  . enoxaparin (LOVENOX) injection  40 mg Subcutaneous Q24H  . feeding supplement (ENSURE)  1 Container Oral TID BM  . levothyroxine  112.5 mcg Oral QAC breakfast  . loratadine  10 mg Oral Daily  . mirtazapine  15 mg Oral QHS  . pregabalin  100 mg Oral Daily  . simvastatin  20 mg Oral q1800    Mikey College MS, RD, LDN [phone] Pager [phone] After Hours Pager

## 2013-07-06 NOTE — Plan of Care (Signed)
Problem: Phase I Progression Outcomes Goal: OOB as tolerated unless otherwise ordered Outcome: Progressing Pt up with assistance.  Goal: Voiding-avoid urinary catheter unless indicated Outcome: Progressing Pt voiding with no difficulties.  Goal: Hemodynamically stable Outcome: Progressing Pt VS stable.   Problem: Phase II Progression Outcomes Goal: No active bleeding Outcome: Progressing Pt displays no visible symptoms of bleeding.  Goal: Tolerating diet Outcome: Progressing Pt tolerating full liquids.

## 2013-07-06 NOTE — Progress Notes (Signed)
6 Days Post-Op  Subjective: Feeling OK this morning No further nausea or vomiting Denies pain  Objective: Vital signs in last 24 hours: Temp:  [98.4 F (36.9 C)-99.2 F (37.3 C)] 98.4 F (36.9 C) (01/08 0619) Pulse Rate:  [57-76] 72 (01/08 0619) Resp:  [16] 16 (01/08 0619) BP: (110-161)/(60-71) 161/71 mmHg (01/08 0619) SpO2:  [95 %-99 %] 96 % (01/08 0619) Weight:  [122 lb 5.7 oz (55.5 kg)] 122 lb 5.7 oz (55.5 kg) (01/08 0619) Last BM Date: 07/27/2013  Intake/Output from previous day: 07/27/22 0701 - 01/08 0700 In: 320 [P.O.:320] Out: 2051 [Urine:1700; Emesis/NG output:300; Drains:50; Stool:1] Intake/Output this shift:    Abdomen soft, non tender and non distended this moring Incision clean  Lab Results:   Recent Labs  2013-07-27 0545  WBC 9.0  HGB 10.5*  HCT 30.7*  PLT 336   BMET  Recent Labs  July 27, 2013 0545  NA 139  K 4.1  CL 98  CO2 32  GLUCOSE 126*  BUN 13  CREATININE 0.87  CALCIUM 8.8   PT/INR No results found for this basename: LABPROT, INR,  in the last 72 hours ABG No results found for this basename: PHART, PCO2, PO2, HCO3,  in the last 72 hours  Studies/Results: Dg Abd 2 Views  Jul 27, 2013   CLINICAL DATA:  One-day history of nausea and vomiting, postoperative day 1.  EXAM: ABDOMEN - 2 VIEW  COMPARISON:  Supine abdominal film of June 16, 2013.  FINDINGS: There are loops of mildly distended gas-filled small bowel in the left mid abdomen on the upright film. Small air-fluid levels are noted within them. On the supine film the small-bowel character is demonstrated to better advantage. There is no evidence of colonic distention. There is no definite gas within the rectum. No free extraluminal gas collections are suspected. There are degenerative changes of the lumbar spine.  IMPRESSION: The bowel gas pattern suggests a small bowel ileus or partial mid to distal small bowel obstruction. There is no evidence of perforation.   Electronically Signed   By: David   Martinique   On: 2013-07-27 13:49    Anti-infectives: Anti-infectives   Start     Dose/Rate Route Frequency Ordered Stop   06/30/13 2200  cefoTEtan (CEFOTAN) 2 g in dextrose 5 % 50 mL IVPB     2 g 100 mL/hr over 30 Minutes Intravenous Every 12 hours 06/30/13 1528 06/30/13 2228   06/30/13 0900  cefoTEtan (CEFOTAN) 2 g in dextrose 5 % 50 mL IVPB     2 g 100 mL/hr over 30 Minutes Intravenous On call to O.R. 06/30/13 0825 06/30/13 1015   06/29/13 1300  neomycin (MYCIFRADIN) tablet 1,000 mg     1,000 mg Oral 3 times per day on Thu 06/29/13 0951 06/29/13 2225   06/29/13 1300  metroNIDAZOLE (FLAGYL) tablet 1,000 mg     1,000 mg Oral 3 times per day on Thu 06/29/13 1009 06/29/13 2225   06/29/13 0951  metroNIDAZOLE (FLAGYL) tablet 500 mg  Status:  Discontinued    Comments:  Take 2 pills (=1000mg ) by mouth at 1pm, 3pm, and 10pm the day before your colorectal operation   500 mg Oral As directed 06/29/13 0951 06/29/13 1007      Assessment/Plan: s/p Procedure(s): LAPAROSCOPIC RECTOPEXY WITH SIGMOID COLECTOMY (N/A)  abd xrays yesterday consistent with an ileus Will keep on full liquids today and advance to regular diet tomorrow morning if still improving Hopefully home Saturday if improving  LOS: 9 days  Clester Chlebowski A 07/06/2013

## 2013-07-06 NOTE — Progress Notes (Signed)
Occupational Therapy Treatment Patient Details Name: Erika Whitney MRN: [id number] DOB: October 18, 1924 Today's Date: 07/06/2013 Time: 9233-0076 OT Time Calculation (min): 24 min  OT Assessment / Plan / Recommendation  History of present illness pt was admitted for GI Bleed. She is s/p colonoscopy, s/p laprascopic rectoplexy    OT comments  Discussed safety recommendation for in the home including grab bar placement and tub bench option. Son will look into obtaining a bench for the tub on his own.  Educated him on coverage and options for obtaining one. Pt will continue to benefit from OT services to maximize ADL independence for d/c home with 24/7 being arranged.   Follow Up Recommendations  Home health OT;Supervision/Assistance - 24 hour    Barriers to Discharge       Equipment Recommendations  None recommended by OT (unless son decides to obtain tubbench through hospital)    Recommendations for Other Services    Frequency Min 2X/week   Progress towards OT Goals Progress towards OT goals: Progressing toward goals  Plan Discharge plan remains appropriate    Precautions / Restrictions Precautions Precautions: Fall Restrictions Weight Bearing Restrictions: No   Pertinent Vitals/Pain 1/10 abdomen; rest, reposition    ADL  Grooming: Min guard;Wash/dry hands;Teeth care Where Assessed - Grooming: Unsupported standing Toilet Transfer: Performed;Min guard Science writer: Comfort height toilet Toileting - Clothing Manipulation and Hygiene: Performed;Min guard Where Assessed - Best boy and Hygiene: Sit to stand from 3-in-1 or toilet ADL Comments: Son present for session and discussed in length the tub seat versus bench option. He prefers to look into the tub bench option for long term safety/use. Explained coverage and where he can obtain one. He will look into obtaining one on his own but will inform staffing if he decides to obtain one through the  hospital. Also discussed long term safety with grab bar placement next to toilet. she is currently able to transition on and off toilet without grab bar or use of edge of toilet with slightly increased time. Son is arranging 24/7 assist by family and hired help.     OT Diagnosis:    OT Problem List:   OT Treatment Interventions:     OT Goals(current goals can now be found in the care plan section)    Visit Information  Last OT Received On: 07/06/13 Assistance Needed: +1 History of Present Illness: pt was admitted for GI Bleed. She is s/p colonoscopy, s/p laprascopic rectoplexy     Subjective Data      Prior Functioning       Cognition  Cognition Arousal/Alertness: Awake/alert Behavior During Therapy: WFL for tasks assessed/performed Overall Cognitive Status:  (per son, has start of dementia. )    Mobility  Transfers Overall transfer level: Needs assistance Transfers: Sit to/from Stand Sit to Stand: Min guard    Exercises      Balance    End of Session OT - End of Session Activity Tolerance: Patient tolerated treatment well Patient left: in chair;with call bell/phone within reach;with family/visitor present  GO     Jules Schick [phone] 07/06/2013, 9:41 AM

## 2013-07-07 LAB — GLUCOSE, CAPILLARY: Glucose-Capillary: 115 mg/dL — ABNORMAL HIGH (ref 70–99)

## 2013-07-07 NOTE — Progress Notes (Signed)
Physical Therapy Treatment Patient Details Name: Erika Whitney MRN: [id number] DOB: 01-Jan-1925 Today's Date: 07/07/2013 Time: 0932-6712 PT Time Calculation (min): 25 min  PT Assessment / Plan / Recommendation  History of Present Illness     PT Comments   Pt OOB in recliner feeling better.  Amb in hallway no AD hand held assist down to 3rd floor to see [facility] mural.  One sitting rest break was need before returning to her room.  No c/o nausea.   Follow Up Recommendations  Home health PT     Does the patient have the potential to tolerate intense rehabilitation     Barriers to Discharge        Equipment Recommendations       Recommendations for Other Services    Frequency Min 3X/week   Progress towards PT Goals Progress towards PT goals: Progressing toward goals  Plan      Precautions / Restrictions Precautions Precautions: Fall   Pertinent Vitals/Pain     Mobility  Bed Mobility General bed mobility comments: Pt OOB in recliner Transfers Overall transfer level: Needs assistance Equipment used: 1 person hand held assist Transfers: Sit to/from Stand Sit to Stand: Min guard General transfer comment: increased time Ambulation/Gait Ambulation/Gait assistance: Min assist Ambulation Distance (Feet): 650 Feet Assistive device: 1 person hand held assist Gait Pattern/deviations: Step-through pattern Gait velocity: WFL slightly slower than her regular pace, pt stated.  Good alternating gait with equal steps and weight shift.  No LOB.  No c/o nausea.    Exercises     PT Diagnosis:    PT Problem List:   PT Treatment Interventions:     PT Goals (current goals can now be found in the care plan section)    Visit Information  Last PT Received On: 07/07/13 Assistance Needed: +1    Subjective Data      Cognition       Balance     End of Session PT - End of Session Equipment Utilized During Treatment: Gait belt Activity Tolerance: Patient tolerated treatment  well Patient left: with call bell/phone within reach;with family/visitor present   Rica Koyanagi  PTA [facility]  Acute  Rehab Pager      [phone]

## 2013-07-07 NOTE — Progress Notes (Signed)
7 Days Post-Op  Subjective: Feeling better this morning No further nausea or vomiting Denies pain Having som flatus  Objective: Vital signs in last 24 hours: Temp:  [97.9 F (36.6 C)-99 F (37.2 C)] 97.9 F (36.6 C) (01/09 0629) Pulse Rate:  [63-69] 68 (01/09 0629) Resp:  [16-18] 16 (01/09 0629) BP: (116-132)/(61-72) 116/68 mmHg (01/09 0629) SpO2:  [93 %-97 %] 94 % (01/09 0629) Last BM Date: 07/06/13  Intake/Output from previous day: 01/08 0701 - 01/09 0700 In: 360 [P.O.:360] Out: 2000 [Urine:2000] Intake/Output this shift:    Abdomen soft, non tender and non distended this moring Incision clean  Lab Results:   Recent Labs  2013/07/31 0545  WBC 9.0  HGB 10.5*  HCT 30.7*  PLT 336   BMET  Recent Labs  07-31-2013 0545  NA 139  K 4.1  CL 98  CO2 32  GLUCOSE 126*  BUN 13  CREATININE 0.87  CALCIUM 8.8   PT/INR No results found for this basename: LABPROT, INR,  in the last 72 hours ABG No results found for this basename: PHART, PCO2, PO2, HCO3,  in the last 72 hours  Studies/Results: Dg Abd 2 Views  07-31-13   CLINICAL DATA:  One-day history of nausea and vomiting, postoperative day 1.  EXAM: ABDOMEN - 2 VIEW  COMPARISON:  Supine abdominal film of June 16, 2013.  FINDINGS: There are loops of mildly distended gas-filled small bowel in the left mid abdomen on the upright film. Small air-fluid levels are noted within them. On the supine film the small-bowel character is demonstrated to better advantage. There is no evidence of colonic distention. There is no definite gas within the rectum. No free extraluminal gas collections are suspected. There are degenerative changes of the lumbar spine.  IMPRESSION: The bowel gas pattern suggests a small bowel ileus or partial mid to distal small bowel obstruction. There is no evidence of perforation.   Electronically Signed   By: David  [place]   On: 2013-07-31 13:49    Anti-infectives: Anti-infectives   Start      Dose/Rate Route Frequency Ordered Stop   06/30/13 2200  cefoTEtan (CEFOTAN) 2 g in dextrose 5 % 50 mL IVPB     2 g 100 mL/hr over 30 Minutes Intravenous Every 12 hours 06/30/13 1528 06/30/13 2228   06/30/13 0900  cefoTEtan (CEFOTAN) 2 g in dextrose 5 % 50 mL IVPB     2 g 100 mL/hr over 30 Minutes Intravenous On call to O.R. 06/30/13 0825 06/30/13 1015   06/29/13 1300  neomycin (MYCIFRADIN) tablet 1,000 mg     1,000 mg Oral 3 times per day on Thu 06/29/13 0951 06/29/13 2225   06/29/13 1300  metroNIDAZOLE (FLAGYL) tablet 1,000 mg     1,000 mg Oral 3 times per day on Thu 06/29/13 1009 06/29/13 2225   06/29/13 0951  metroNIDAZOLE (FLAGYL) tablet 500 mg  Status:  Discontinued    Comments:  Take 2 pills (=1000mg ) by mouth at 1pm, 3pm, and 10pm the day before your colorectal operation   500 mg Oral As directed 06/29/13 0951 06/29/13 1007      Assessment/Plan: s/p Procedure(s): LAPAROSCOPIC RECTOPEXY WITH SIGMOID COLECTOMY (N/A)  Low fiber diet today Hopefully home this weekend if improving  LOS: 10 days    Jahred Tatar C. 0/07/5850

## 2013-07-08 LAB — GLUCOSE, CAPILLARY: Glucose-Capillary: 98 mg/dL (ref 70–99)

## 2013-07-08 NOTE — Progress Notes (Signed)
Occupational Therapy Treatment Patient Details Name: Erika Whitney MRN: [id number] DOB: 09-02-1924 Today's Date: 07/08/2013 Time: 9833-8250 OT Time Calculation (min): 25 min  OT Assessment / Plan / Recommendation     OT comments  Pt does feel she is getting stronger!                 Progress towards OT Goals Progress towards OT goals: Progressing toward goals  Plan Discharge plan remains appropriate    Precautions / Restrictions Precautions Precautions: Fall       ADL  Grooming: Min guard Where Assessed - Grooming: Unsupported standing Toilet Transfer: Magazine features editor Method: Sit to Loss adjuster, chartered: Comfort height toilet Toileting - Clothing Manipulation and Hygiene: Moderate assistance Where Assessed - Toileting Clothing Manipulation and Hygiene: Standing;Sit on 3-in-1 or toilet      OT Goals(current goals can now be found in the care plan section)    Visit Information  Last OT Received On: 07/08/13 Assistance Needed: +1          Cognition  Cognition Arousal/Alertness: Awake/alert Behavior During Therapy: [facility] for tasks assessed/performed Overall Cognitive Status:  (per son, has start of dementia. )    Mobility  Bed Mobility General bed mobility comments: overall S Transfers General transfer comment: overall min A          End of Session OT - End of Session Activity Tolerance: Patient tolerated treatment well Patient left: in bed with call bell and telephone  GO     LeRoy, Thereasa Parkin 07/08/2013, 3:19 PM

## 2013-07-08 NOTE — Progress Notes (Signed)
8 Days Post-Op  Subjective: Eating better.  Having loose BMs. Walking some.  Son in room with her.  Objective: Vital signs in last 24 hours: Temp:  [97.9 F (36.6 C)-98.5 F (36.9 C)] 98 F (36.7 C) (01/10 0510) Pulse Rate:  [60-78] 66 (01/10 0510) Resp:  [16] 16 (01/10 0510) BP: (103-152)/(60-73) 129/66 mmHg (01/10 0510) SpO2:  [92 %-95 %] 94 % (01/10 0510) Last BM Date: 07/06/13  Intake/Output from previous day: 01/09 0701 - 01/10 0700 In: 600 [P.O.:600] Out: 1600 [Urine:1600] Intake/Output this shift:    PE: General- In NAD Abdomen-soft, incisions clean and intact  Lab Results:  No results found for this basename: WBC, HGB, HCT, PLT,  in the last 72 hours BMET No results found for this basename: NA, K, CL, CO2, GLUCOSE, BUN, CREATININE, CALCIUM,  in the last 72 hours PT/INR No results found for this basename: LABPROT, INR,  in the last 72 hours Comprehensive Metabolic Panel:    Component Value Date/Time   NA 139 07/05/2013 0545   K 4.1 07/05/2013 0545   CL 98 07/05/2013 0545   CO2 32 07/05/2013 0545   BUN 13 07/05/2013 0545   CREATININE 0.87 07/05/2013 0545   GLUCOSE 126* 07/05/2013 0545   CALCIUM 8.8 07/05/2013 0545   AST 23 07/05/2013 0545   ALT 19 07/05/2013 0545   ALKPHOS 55 07/05/2013 0545   BILITOT <0.2* 07/05/2013 0545   PROT 5.6* 07/05/2013 0545   ALBUMIN 2.5* 07/05/2013 0545     Studies/Results: No results found.  Anti-infectives: Anti-infectives   Start     Dose/Rate Route Frequency Ordered Stop   06/30/13 2200  cefoTEtan (CEFOTAN) 2 g in dextrose 5 % 50 mL IVPB     2 g 100 mL/hr over 30 Minutes Intravenous Every 12 hours 06/30/13 1528 06/30/13 2228   06/30/13 0900  cefoTEtan (CEFOTAN) 2 g in dextrose 5 % 50 mL IVPB     2 g 100 mL/hr over 30 Minutes Intravenous On call to O.R. 06/30/13 0825 06/30/13 1015   06/29/13 1300  neomycin (MYCIFRADIN) tablet 1,000 mg     1,000 mg Oral 3 times per day on Thu 06/29/13 0951 06/29/13 2225   06/29/13 1300  metroNIDAZOLE (FLAGYL)  tablet 1,000 mg     1,000 mg Oral 3 times per day on Thu 06/29/13 1009 06/29/13 2225   06/29/13 0951  metroNIDAZOLE (FLAGYL) tablet 500 mg  Status:  Discontinued    Comments:  Take 2 pills (=1000mg ) by mouth at 1pm, 3pm, and 10pm the day before your colorectal operation   500 mg Oral As directed 06/29/13 9242 06/29/13 1007      Assessment Rectal prolapse s/p laparoscopic sigmoid colectomy and rectopexy-slowing improving, eating better, a little weak.    LOS: 11 days   Plan: Hopefully home tomorrow.   Cheral Cappucci J 07/08/2013

## 2013-07-08 NOTE — Discharge Instructions (Addendum)
[facility], [place] [phone]  OPEN ABDOMINAL SURGERY: POST OP INSTRUCTIONS  Always review your discharge instruction sheet given to you by the facility where your surgery was performed.  IF YOU HAVE DISABILITY OR FAMILY LEAVE FORMS, YOU MUST BRING THEM TO THE OFFICE FOR PROCESSING.  PLEASE DO NOT GIVE THEM TO YOUR DOCTOR.  1. A prescription for pain medication may be given to you upon discharge.  Take your pain medication as prescribed, if needed.  If narcotic pain medicine is not needed, then you may take acetaminophen (Tylenol) or ibuprofen (Advil) as needed. 2. Take your usually prescribed medications unless otherwise directed. 3. If you need a refill on your pain medication, please contact your pharmacy. They will contact our office to request authorization.  Prescriptions will not be filled after 5pm or on week-ends. 4. You should follow a light diet the first few days after arrival home, such as soup and crackers, pudding, etc.unless your doctor has advised otherwise. A high-fiber, low fat diet can be resumed as tolerated.   Be sure to include lots of fluids daily. Most patients will experience some swelling and bruising on the chest and neck area.  Ice packs will help.  Swelling and bruising can take several days to resolve 5. Most patients will experience some swelling and bruising in the area of the incision. Ice pack will help. Swelling and bruising can take several days to resolve..  6. It is common to experience some constipation if taking pain medication after surgery.  Increasing fluid intake and taking a stool softener will usually help or prevent this problem from occurring.  A mild laxative (Milk of Magnesia or Miralax) should be taken according to package directions if there are no bowel movements after 48 hours. 7.  You may have steri-strips (small skin tapes) in place directly over the incision.  These strips should be left on the skin for 7-10 days.  If your  surgeon used skin glue on the incision, you may shower in 24 hours.  The glue will flake off over the next 2-3 weeks.  Any sutures or staples will be removed at the office during your follow-up visit. You may find that a light gauze bandage over your incision may keep your staples from being rubbed or pulled. You may shower and replace the bandage daily. 8. ACTIVITIES:  You may resume regular (light) daily activities beginning the next day--such as daily self-care, walking, climbing stairs--gradually increasing activities as tolerated.  You may have sexual intercourse when it is comfortable.  Refrain from any heavy lifting or straining-nothing over 10 pounds.  a. You may drive when you no longer are taking prescription pain medication, you can comfortably wear a seatbelt, and you can safely maneuver your car and apply brakes b. Return to Work: ___________________________________ 63. You should see your doctor in the office for a follow-up appointment approximately two weeks after your surgery.  Make sure that you call for this appointment within a day or two after you arrive home to insure a convenient appointment time. OTHER INSTRUCTIONS:  _____________________________________________________________ _____________________________________________________________  WHEN TO CALL YOUR DOCTOR: 1. Fever over 101.0 2. Inability to urinate 3. Nausea and/or vomiting 4. Extreme swelling or bruising 5. Continued bleeding from incision. 6. Increased pain, redness, or drainage from the incision. 7. Difficulty swallowing or breathing 8. Muscle cramping or spasms. 9. Numbness or tingling in hands or feet or around lips.  The clinic staff is available to answer  your questions during regular business hours.  Please dont hesitate to call and ask to speak to one of the nurses if you have concerns.  For further questions, please visit [email or web address]  Low-Fiber Diet For the next 2 weeks then just  a regular diet. Fiber is found in fruits, vegetables, and grains. A low-fiber diet restricts fibrous foods that are not digested in the small intestine. A diet containing about 10 grams of fiber is considered low fiber.  PURPOSE  To prevent blockage of a partially obstructed or narrowed gastrointestinal tract.  To reduce fecal weight and volume.  To slow the movement of feces. WHEN IS THIS DIET USED?  It may be used during the acute phase of Crohn disease, ulcerative colitis, regional enteritis, or diverticulitis.  It may be used if your intestinal or esophageal tubes are narrowing (stenosis).  It may be used as a transitional diet following surgery, injury (trauma), or illness. CHOOSING FOODS Check labels, especially on foods from the starch list. Often times, dietary fiber content is listed on the nutrition facts panel. Please ask your Registered Dietitian if you have questions about specific foods that are related to your condition, especially if the food is not listed on this handout. Breads and Starches  Allowed: White, [place], and pita breads, plain rolls, buns, or sweet rolls, doughnuts, waffles, pancakes, bagels. Plain muffins, biscuits, matzoth. Soda, saltine, graham crackers. Pretzels, rusks, melba toast, zwieback. Cooked cereals: cornmeal, farina, or cream cereals. Dry cereals: refined corn, wheat, rice, and oat cereals (check label). Potatoes prepared any way without skins, refined macaroni, spaghetti, noodles, refined rice.  Avoid: Whole-wheat bread, rolls, and crackers. Multigrains, rye, bran seeds, nuts, or coconut. Cereals containing whole grains, multigrains, bran, coconut, nuts, raisins. Cooked or dry oatmeal. Coarse wheat cereals, granola. Cereals advertised as "high fiber." Potato skins. Whole-grain pasta, wild or brown rice. Popcorn. Vegetables  Allowed: Strained tomato and vegetable juices. Fresh lettuce, cucumber, spinach. Well-cooked or canned: asparagus, bean  sprouts, broccoli, cut green beans, cauliflower, pumpkin, beets, mushrooms, yellow squash, tomato, tomato sauce, zucchini, turnips.Keep servings limited to  cup.  Avoid: Fresh, cooked, or canned: artichokes, baked beans, beet greens, Brussels sprouts, corn, kale, legumes, peas, sweet potatoes. Avoid large servings of any vegetables. Fruit  Allowed: All fruit juices except prune juice. Cooked or canned fruits without skin and seeds: apricots, applesauce, cantaloupe, cherries, grapefruit, grapes, kiwi, mandarin oranges, peaches, pears, fruit cocktail, pineapple, plums, watermelon. Fresh without skin: banana, grapes, cantaloupe, avocado, cherries, pineapple, kiwi, nectarines, peaches, blueberries. Keep servings limited to  cup or 1 piece.  Avoid: Fresh: apples with or without skin, apricots, mangoes, pears, raspberries, strawberries. Prune juice and juices with pulp, stewed or dried prunes. Dried fruits, raisins, dates. Avoid large servings of all fresh fruits. Meat and Protein Substitutes  Allowed: Ground or well-cooked tender beef, ham, veal, lamb, pork, poultry. Eggs, plain cheese. Fish, oysters, shrimp, lobster, other seafood. Liver, organ meats. Smooth nut butters.  Avoid: Tough, fibrous meats with gristle. Chunky nut butter.Cheese with seeds, nuts, or other foods not allowed. Nuts, seeds, legumes, dried peas, beans, lentils. Dairy  Allowed: All milk products except those not allowed.  Avoid: Yogurt or cheese that contains nuts, seeds, or added fruit. Soups and Combination Foods  Allowed: Bouillon, broth, or cream soups made from allowed foods. Any strained soup. Casseroles or mixed dishes made with allowed foods.  Avoid: Soups made from vegetables that are not allowed or that contain other foods not allowed. Desserts and Sweets  Allowed:Plain cakes and  cookies, pie made with allowed fruit, pudding, custard, cream pie. Gelatin, fruit, ice, sherbet, frozen ice pops. Ice cream, ice  milk without nuts. Plain hard candy, honey, jelly, molasses, syrup, sugar, chocolate syrup, gumdrops, marshmallows.  Avoid: Desserts, cookies, or candies that contain nuts, peanut butter, dried fruits. Jams, preserves with seeds, marmalade. Fats and Oils  Allowed:Margarine, butter, cream, mayonnaise, salad oils, plain salad dressings made from allowed foods.  Avoid: Seeds, nuts, olives. Beverages  Allowed: All, except those listed to avoid.  Avoid: Fruit juices with high pulp, prune juice. Condiments  Allowed:Ketchup, mustard, horseradish, vinegar, cream sauce, cheese sauce, cocoa powder. Spices in moderation: allspice, basil, bay leaves, celery powder or leaves, cinnamon, cumin powder, curry powder, ginger, mace, marjoram, onion or garlic powder, oregano, paprika, parsley flakes, ground pepper, rosemary, sage, savory, tarragon, thyme, turmeric.  Avoid: Coconut, pickles. SAMPLE MENU Breakfast   cup orange juice.  1 boiled egg.  1 slice white toast.  Margarine.   cup cornflakes.  1 cup milk.  Beverage. Lunch   cup chicken noodle soup.  2 to 3 oz sliced roast beef.  2 slices white bread.  Mayonnaise.   cup tomato juice.  1 small banana.  Beverage. Dinner  3 oz baked chicken.   cup scalloped potatoes.   cup cooked beets.  White dinner roll.  Margarine.   cup canned peaches.  Beverage. Document Released: 12/05/2001 Document Revised: 02/15/2013 Document Reviewed: 07/02/2011 [facility] Patient Information 2014 [place].

## 2013-07-08 NOTE — Progress Notes (Signed)
Report received from Nelda Severe, RN. No change from initial pm assessment. Will continue to follow the plan of care.

## 2013-07-09 LAB — GLUCOSE, CAPILLARY: Glucose-Capillary: 114 mg/dL — ABNORMAL HIGH (ref 70–99)

## 2013-07-09 NOTE — Care Management Note (Signed)
As previously arranged by floor CM, [facility] to provide [facility] services at dc. MD orders faxed to [facility] at [phone]. Confirmation received. Pt's son to provide home.    Venita Lick Kambry Takacs,MSN,RN [phone]

## 2013-07-09 NOTE — Progress Notes (Signed)
9 Days Post-Op  Subjective: Feels like she is ready to go home.  Objective: Vital signs in last 24 hours: Temp:  [97.3 F (36.3 C)-98.1 F (36.7 C)] 97.3 F (36.3 C) (01/11 0546) Pulse Rate:  [65-69] 65 (01/11 0546) Resp:  [16-18] 16 (01/11 0546) BP: (120-132)/(58-66) 120/61 mmHg (01/11 0546) SpO2:  [95 %-98 %] 95 % (01/11 0546) Weight:  [125 lb 3.5 oz (56.8 kg)] 125 lb 3.5 oz (56.8 kg) (01/11 0546) Last BM Date: 07/06/13  Intake/Output from previous day: 01/10 0701 - 01/11 0700 In: 640 [P.O.:640] Out: 2075 [Urine:2075] Intake/Output this shift:    PE: General- In NAD Abdomen-soft, incisions clean and intact  Lab Results:  No results found for this basename: WBC, HGB, HCT, PLT,  in the last 72 hours BMET No results found for this basename: NA, K, CL, CO2, GLUCOSE, BUN, CREATININE, CALCIUM,  in the last 72 hours PT/INR No results found for this basename: LABPROT, INR,  in the last 72 hours Comprehensive Metabolic Panel:    Component Value Date/Time   NA 139 07/05/2013 0545   K 4.1 07/05/2013 0545   CL 98 07/05/2013 0545   CO2 32 07/05/2013 0545   BUN 13 07/05/2013 0545   CREATININE 0.87 07/05/2013 0545   GLUCOSE 126* 07/05/2013 0545   CALCIUM 8.8 07/05/2013 0545   AST 23 07/05/2013 0545   ALT 19 07/05/2013 0545   ALKPHOS 55 07/05/2013 0545   BILITOT <0.2* 07/05/2013 0545   PROT 5.6* 07/05/2013 0545   ALBUMIN 2.5* 07/05/2013 0545     Studies/Results: No results found.  Anti-infectives: Anti-infectives   Start     Dose/Rate Route Frequency Ordered Stop   06/30/13 2200  cefoTEtan (CEFOTAN) 2 g in dextrose 5 % 50 mL IVPB     2 g 100 mL/hr over 30 Minutes Intravenous Every 12 hours 06/30/13 1528 06/30/13 2228   06/30/13 0900  cefoTEtan (CEFOTAN) 2 g in dextrose 5 % 50 mL IVPB     2 g 100 mL/hr over 30 Minutes Intravenous On call to O.R. 06/30/13 0825 06/30/13 1015   06/29/13 1300  neomycin (MYCIFRADIN) tablet 1,000 mg     1,000 mg Oral 3 times per day on Thu 06/29/13 0951 06/29/13  2225   06/29/13 1300  metroNIDAZOLE (FLAGYL) tablet 1,000 mg     1,000 mg Oral 3 times per day on Thu 06/29/13 1009 06/29/13 2225   06/29/13 0951  metroNIDAZOLE (FLAGYL) tablet 500 mg  Status:  Discontinued    Comments:  Take 2 pills (=1000mg ) by mouth at 1pm, 3pm, and 10pm the day before your colorectal operation   500 mg Oral As directed 06/29/13 0354 06/29/13 1007      Assessment Rectal prolapse s/p laparoscopic sigmoid colectomy and rectopexy-feeling better overall today.    LOS: 12 days   Plan: Discharge today.  Instructions given.   Satsuki Zillmer J 07/09/2013

## 2013-07-09 NOTE — Progress Notes (Signed)
Assessment unchanged. Pt as well as daughter and son verbalized understanding of dc instructions. Scripts x 2 given as provided by MD. Pt already has My Chart account set up. All questions regarding meds, diet and activity resolved with family. Discharged via wc to front entrance to meet awaiting vehicle to carry home. Accompanied by family and NT.

## 2013-07-26 ENCOUNTER — Ambulatory Visit (INDEPENDENT_AMBULATORY_CARE_PROVIDER_SITE_OTHER): Payer: Medicare Other | Admitting: General Surgery

## 2013-07-26 ENCOUNTER — Encounter (INDEPENDENT_AMBULATORY_CARE_PROVIDER_SITE_OTHER): Payer: Self-pay | Admitting: General Surgery

## 2013-07-26 VITALS — BP 110/62 | HR 66 | Temp 98.2°F | Resp 18 | Wt 112.0 lb

## 2013-07-26 DIAGNOSIS — Z9889 Other specified postprocedural states: Secondary | ICD-10-CM

## 2013-07-26 NOTE — Patient Instructions (Signed)
Ok to return to high fiber diet and increase activity

## 2013-07-26 NOTE — Progress Notes (Signed)
Erika Whitney is a 80 y.o. female who is status post a Sigmoid resection and rectopexy on 06/30/2013.  She has done well after surgery. She is having regular bowel movements. She denies any pain. She is tolerating a diet not losing weight. She denies any recurrent rectal prolapse.  Objective: Filed Vitals:   07/26/13 1647  BP: 110/62  Pulse: 66  Temp: 98.2 F (36.8 C)  Resp: 18    General appearance: alert and cooperative GI: normal findings: soft, non-tender  Incision: healing well   Assessment: s/p  Patient Active Problem List   Diagnosis Date Noted  . Rectal prolapse 06/28/2013  . Vasovagal syncope 06/28/2013  . GI bleed 06/27/2013  . Rectal bleed 06/27/2013  . Dyslipidemia 06/27/2013  . Hypothyroidism 06/27/2013  . Anxiety and depression 06/27/2013  . Dementia 06/27/2013  . Neuropathic pain 06/27/2013    Plan: She is doing well after sigmoid resection and rectopexy. I will see her back on as needed basis.    Rosario Adie, [facility], [place]   07/26/2013 5:07 PM

## 2014-05-18 ENCOUNTER — Other Ambulatory Visit (HOSPITAL_COMMUNITY): Payer: Self-pay | Admitting: Neurology

## 2014-05-23 ENCOUNTER — Other Ambulatory Visit (HOSPITAL_COMMUNITY): Payer: Self-pay | Admitting: Neurology

## 2014-05-23 DIAGNOSIS — R1310 Dysphagia, unspecified: Secondary | ICD-10-CM

## 2014-05-28 ENCOUNTER — Encounter (HOSPITAL_COMMUNITY): Payer: Self-pay

## 2014-05-28 ENCOUNTER — Ambulatory Visit (HOSPITAL_COMMUNITY)
Admission: RE | Admit: 2014-05-28 | Discharge: 2014-05-28 | Disposition: A | Discharge status: Home / Self Care | Payer: Medicare Other | Source: Ambulatory Visit | Attending: Neurology | Admitting: Neurology

## 2014-05-28 DIAGNOSIS — R1310 Dysphagia, unspecified: Secondary | ICD-10-CM | POA: Insufficient documentation

## 2014-05-28 DIAGNOSIS — R0989 Other specified symptoms and signs involving the circulatory and respiratory systems: Secondary | ICD-10-CM | POA: Insufficient documentation

## 2014-05-28 DIAGNOSIS — R05 Cough: Secondary | ICD-10-CM | POA: Insufficient documentation

## 2014-05-28 NOTE — Procedures (Signed)
Objective Swallowing Evaluation: Modified Barium Swallowing Study  Patient Details  Name: Erika Whitney MRN: [id number] Date of Birth: May 12, 1925  Today's Date: 05/28/2014 Time: 1115-1200 SLP Time Calculation (min) (ACUTE ONLY): 45 min  Past Medical History:  Past Medical History  Diagnosis Date  . Rectal prolapse   . Dementia   . Cancer     BREAST  . Arthritis    Past Surgical History:  Past Surgical History  Procedure Laterality Date  . Abdominal hysterectomy    . Cesarean section    . Appendectomy    . Left mastectomy    . Mastectomy    . Colonoscopy N/A 06/28/2013    Procedure: COLONOSCOPY;  Surgeon: Beryle Beams, MD;  Location: [facility];  Service: Endoscopy;  Laterality: N/A;  . Laparoscopic sigmoid colectomy N/A 06/30/2013    Procedure: LAPAROSCOPIC RECTOPEXY WITH SIGMOID COLECTOMY;  Surgeon: Leighton Ruff, MD;  Location: [facility];  Service: General;  Laterality: N/A;   HPI:  80 year old female with PMH of rectal prolapse, dementia, cancer, and arthitis, presents for OP MBS due to family reported coughing with liquids (began a few years ago) as well as recent pocketing of solid bolus.      Assessment / Plan / Recommendation Clinical Impression  Dysphagia Diagnosis: Within Functional Limits Clinical impression: Patient presents with a functional oropharyngeal swallow with an age and dementia related delay in swallow initiation resulting in intermittent trace flash penetration of liquids occurring primarily with mixed consistencies. No aspiration or oral phase deficits occurring during today's study however unable to r/o inconsistent perseverative mastication of bolus as well as an increase in swallow initiation delays resulting in episodic aspiration given dementia diagnosis. Extensive education provided to patient's son and daughter regarding above as well as compensatory strategies which may improve both the safety and efficiency of po intake/swallow. Handouts  provided. No skilled f/u required at this time however if symptoms worsen, may benefit from brief [place] SLP interevntion to maximize potential to diet consistency and method of intake as well as for additional caregiver education.     Treatment Recommendation  No treatment recommended at this time    Diet Recommendation Dysphagia 3 (Mechanical Soft);Thin liquid   Liquid Administration via: Cup;Straw Medication Administration: Whole meds with liquid Supervision: Patient able to self feed Compensations: Slow rate;Small sips/bites Postural Changes and/or Swallow Maneuvers: Seated upright 90 degrees    Other  Recommendations Oral Care Recommendations: Oral care BID   Follow Up Recommendations  None               General HPI: 80 year old female with PMH of rectal prolapse, dementia, cancer, and arthitis, presents for OP MBS due to family reported coughing with liquids (began a few years ago) as well as recent pocketing of solid bolus.  Type of Study: Modified Barium Swallowing Study Reason for Referral: Objectively evaluate swallowing function Previous Swallow Assessment: none Diet Prior to this Study: Regular;Thin liquids Temperature Spikes Noted: No Respiratory Status: Room air History of Recent Intubation: No Behavior/Cognition: Alert;Cooperative;Pleasant mood Oral Cavity - Dentition: Dentures, top;Dentures, bottom Oral Motor / Sensory Function: Within functional limits Self-Feeding Abilities: Able to feed self Patient Positioning: Upright in chair Baseline Vocal Quality: Clear Volitional Cough: Strong Volitional Swallow: Able to elicit Anatomy: Within functional limits    Reason for Referral Objectively evaluate swallowing function   Oral Phase Oral Preparation/Oral Phase Oral Phase: WFL   Pharyngeal Phase Pharyngeal Phase Pharyngeal Phase: Within functional limits (age appropriate, trace flash penetration  of thin liquids)  Cervical Esophageal Phase    GO    Cervical  Esophageal Phase Cervical Esophageal Phase: [facility]    Functional Assessment Tool Used: skilled clinical judgement Functional Limitations: Swallowing Swallow Current Status (T6144): At least 1 percent but less than 20 percent impaired, limited or restricted Swallow Goal Status [phone]): At least 1 percent but less than 20 percent impaired, limited or restricted Swallow Discharge Status [phone]): At least 1 percent but less than 20 percent impaired, limited or restricted   [place], [place] [phone]  Gabriel Rainwater Meryl 05/28/2014, 3:01 PM

## 2014-09-12 ENCOUNTER — Ambulatory Visit: Payer: Medicare Other | Attending: Neurology

## 2014-09-12 DIAGNOSIS — Z9181 History of falling: Secondary | ICD-10-CM | POA: Diagnosis not present

## 2014-09-12 DIAGNOSIS — R262 Difficulty in walking, not elsewhere classified: Secondary | ICD-10-CM | POA: Diagnosis not present

## 2014-09-12 DIAGNOSIS — R279 Unspecified lack of coordination: Secondary | ICD-10-CM | POA: Insufficient documentation

## 2014-09-12 DIAGNOSIS — R278 Other lack of coordination: Secondary | ICD-10-CM | POA: Diagnosis present

## 2014-09-12 NOTE — Therapy (Signed)
[facility] [address] [place], [place], [address] Phone: [phone]   Fax:  [phone]  Physical Therapy Evaluation  Patient Details  Name: Erika Whitney MRN: [id number] Date of Birth: 03-26-1925 Referring Provider:  Stephens Shire, MD  Encounter Date: 09/12/2014      PT End of Session - 09/12/14 1226    Visit Number 1   Number of Visits 9   Date for PT Re-Evaluation 10/26/14   Authorization Type Medicare-G code every 10th visit   PT Start Time 1018   PT Stop Time 1100   PT Time Calculation (min) 42 min      Past Medical History  Diagnosis Date  . Rectal prolapse   . Dementia   . Cancer     BREAST  . Arthritis     Past Surgical History  Procedure Laterality Date  . Abdominal hysterectomy    . Cesarean section    . Appendectomy    . Left mastectomy    . Mastectomy    . Colonoscopy N/A 06/28/2013    Procedure: COLONOSCOPY;  Surgeon: Beryle Beams, MD;  Location: [facility];  Service: Endoscopy;  Laterality: N/A;  . Laparoscopic sigmoid colectomy N/A 06/30/2013    Procedure: LAPAROSCOPIC RECTOPEXY WITH SIGMOID COLECTOMY;  Surgeon: Leighton Ruff, MD;  Location: [facility];  Service: General;  Laterality: N/A;    There were no vitals filed for this visit.  Visit Diagnosis:  Lack of coordination - Plan: PT PLAN OF CARE CERT/RE-CERT  Risk for falls - Plan: PT PLAN OF CARE CERT/RE-CERT  Difficulty walking - Plan: PT PLAN OF CARE CERT/RE-CERT      Subjective Assessment - 09/12/14 1029    Symptoms Pt is a 81 year old female with Alzheimer's dementia with recent decline in balance over the past 6-8 months. Her daughter has noticed increased shuffling of pt's feet with ambulation. Pt's daughter reports she believes this has worsened over the winter due to weight gain and less exercise. Pt  has also slowed down with mobility. Pt lives alone but has a caregiver in the mornings from 9-1 and supervise with  medications. And again from 5-8 pm. She has surveilance that notifies her daughter if the patient leaves the house.    Pertinent History Alzheimers type dementia, anxiety   Patient Stated Goals Daughter stated goals: to decrease fall risk and increase level of physical activity, to improve ease of rising from couch   Currently in Pain? No/denies            [facility] PT Assessment - 09/12/14 0001    Assessment   Medical Diagnosis Gait and balance impairment   Onset Date --  July 2015   Prior Therapy Not for this concern  Had PT for back pain a while back.   Precautions   Precautions Fall  dementia, dysphagis but doesn't have fluid precautions   Balance Screen   Has the patient fallen in the past 6 months Yes   How many times? 1   Has the patient had a decrease in activity level because of a fear of falling?  No   Is the patient reluctant to leave their home because of a fear of falling?  No   Home Environment   Living Enviornment Private residence   Living Arrangements Alone  with caregivers part time   Available Help at Discharge Family;Personal care attendant   Type of [place] to enter  Entrance Stairs-Number of Steps 4   Entrance Stairs-Rails Right;Left  unsure if she can reach both   Home Layout One level   Home Equipment --  walking stick   Prior Function   Level of Independence Independent with gait;Independent with transfers;Needs assistance with ADLs;Needs assistance with homemaking   Leisure riding her stationary bike, coloring   Cognition   Overall Cognitive Status --  family reports cognition is declining with the Alzheimer's   Observation/Other Assessments   Focus on Therapeutic Outcomes (FOTO)  Funcitonal Status 49   Other Surveys  Select   Activities of Balance Confidence Scale (ABC Scale)  31.9%   ROM / Strength   AROM / PROM / Strength Strength  BLE WNL 5/5 on muscle test   Flexibility   Soft Tissue Assessment /Muscle Length --   good hamstring and gastroc length   Bed Mobility   Bed Mobility --  reports independence   Transfers   Transfers Sit to Stand   Sit to Stand 7: Independent  5x sit to stand 6.52 seconds (within normal limits)   Ambulation/Gait   Ambulation/Gait Yes   Ambulation/Gait Assistance 5: Supervision   Ambulation Distance (Feet) 200 Feet   Assistive device None   Gait Pattern --  good step length and foot clarance and arm swing   Ambulation Surface Level;Indoor   Gait velocity 3.67   Stairs Yes   Stair Management Technique Two rails;No rails;Alternating pattern  initiated without rails-unsafe, instructed to use rails   Number of Stairs 4   Height of Stairs 6   Standardized Balance Assessment   Standardized Balance Assessment Berg Balance Test;Dynamic Gait Index;Timed Up and Go Test   Berg Balance Test   Sit to Stand Able to stand without using hands and stabilize independently   Standing Unsupported Able to stand safely 2 minutes   Sitting with Back Unsupported but Feet Supported on Floor or Stool Able to sit safely and securely 2 minutes   Stand to Sit Sits safely with minimal use of hands   Transfers Able to transfer safely, minor use of hands   Standing Unsupported with Eyes Closed Able to stand 10 seconds with supervision   Standing Ubsupported with Feet Together Able to place feet together independently and stand for 1 minute with supervision   From Standing, Reach Forward with Outstretched Arm Can reach forward >12 cm safely (5")   From Standing Position, Pick up Object from Floor Able to pick up shoe, needs supervision   From Standing Position, Turn to Look Behind Over each Shoulder Turn sideways only but maintains balance   Turn 360 Degrees Able to turn 360 degrees safely in 4 seconds or less   Standing Unsupported, Alternately Place Feet on Step/Stool Able to stand independently and safely and complete 8 steps in 20 seconds   Standing Unsupported, One Foot in Front Able to take  small step independently and hold 30 seconds   Standing on One Leg Tries to lift leg/unable to hold 3 seconds but remains standing independently   Total Score 45   Timed Up and Go Test   TUG Normal TUG   Normal TUG (seconds) 11.5  <13.5 indicative of fall risk                                PT Long Term Goals - 09/12/14 2108    PT LONG TERM GOAL #1   Title With assistance  of family or caregiver pt will demonstrate correct performance of HEP. Target 10/26/14   PT LONG TERM GOAL #2   Title Increase Berg Balance Test score to 50/56 for decreased fall risk. Target 10/26/14   PT LONG TERM GOAL #3   Title Complete DGI and write appropriate goal:_________________   PT LONG TERM GOAL #4   Title Family member or caregiver will report that pt has greater ease and decreased retropulsion with rising from her couch. Target 10/26/14               Plan - October 02, 2014 1228    Clinical Impression Statement Pt has Alzheimers Dementia and has had recent decline in balance and mobility. Per daughter, she has good days and bad days. Today appears to have been a good day as the patient performed fairly well on standardized balance assessments. She has difficulty with rising from lower surfaces including her couch, difficulty with balance in the dark or with eyes closed indicating possible vestibular impairment, difficulty terminating tasks. She will benefit from skilled PT services to decrease risk of falls.   Pt will benefit from skilled therapeutic intervention in order to improve on the following deficits Decreased coordination;Decreased strength;Postural dysfunction;Decreased balance;Difficulty walking   Rehab Potential Good   PT Frequency 2x / week   PT Duration 2 weeks   PT Treatment/Interventions ADLs/Self Care Home Management;Gait training;Therapeutic exercise;Patient/family education;Stair training;Balance training;Neuromuscular re-education;Therapeutic activities;DME  Instruction   PT Next Visit Plan DGI and goals, HEP-OTAGO and compliant surface with eyes closed   Consulted and Agree with Plan of Care Family member/caregiver;Patient          G-Codes - 10-02-2014 Oct 17, 2110    Functional Assessment Tool Used Berg 45/56 DGI TBD, family reports pt has diffiuclty with rising from couch   Functional Limitation Mobility: Walking and moving around   Mobility: Walking and Moving Around Current Status [phone]) At least 20 percent but less than 40 percent impaired, limited or restricted   Mobility: Walking and Moving Around Goal Status [phone]) At least 1 percent but less than 20 percent impaired, limited or restricted       Problem List Patient Active Problem List   Diagnosis Date Noted  . Rectal prolapse 06/28/2013  . Vasovagal syncope 06/28/2013  . GI bleed 06/27/2013  . Rectal bleed 06/27/2013  . Dyslipidemia 06/27/2013  . Hypothyroidism 06/27/2013  . Anxiety and depression 06/27/2013  . Dementia 06/27/2013  . Neuropathic pain 06/27/2013   Delrae Sawyers, PT,DPT,NCS 10-02-14 9:52 PM Phone [phone] FAX [phone]         [facility] [address] [place], [place], [address] Phone: [phone]   Fax:  [phone]

## 2014-10-03 ENCOUNTER — Ambulatory Visit: Payer: Medicare Other | Attending: Neurology

## 2014-10-03 DIAGNOSIS — Z9181 History of falling: Secondary | ICD-10-CM

## 2014-10-03 DIAGNOSIS — R262 Difficulty in walking, not elsewhere classified: Secondary | ICD-10-CM | POA: Diagnosis not present

## 2014-10-03 DIAGNOSIS — R279 Unspecified lack of coordination: Secondary | ICD-10-CM

## 2014-10-03 DIAGNOSIS — R278 Other lack of coordination: Secondary | ICD-10-CM | POA: Diagnosis present

## 2014-10-03 NOTE — Therapy (Signed)
[facility] [address] [place], [place], [address] Phone: [phone]   Fax:  [phone]  Physical Therapy Treatment  Patient Details  Name: Erika Whitney MRN: [id number] Date of Birth: 1924/07/13 Referring Provider:  Stephens Shire, MD  Encounter Date: 10/03/2014      PT End of Session - 10/03/14 1214    Visit Number 2   Number of Visits 9   Date for PT Re-Evaluation 10/26/14   Authorization Type Medicare-G code every 10th visit   PT Start Time 1024  pt arrived late   PT Stop Time 1103   PT Time Calculation (min) 39 min   Equipment Utilized During Treatment Gait belt   Activity Tolerance Patient tolerated treatment well      Past Medical History  Diagnosis Date  . Rectal prolapse   . Dementia   . Cancer     BREAST  . Arthritis     Past Surgical History  Procedure Laterality Date  . Abdominal hysterectomy    . Cesarean section    . Appendectomy    . Left mastectomy    . Mastectomy    . Colonoscopy N/A 06/28/2013    Procedure: COLONOSCOPY;  Surgeon: Beryle Beams, MD;  [facility]: [facility];  Service: Endoscopy;  Laterality: N/A;  . Laparoscopic sigmoid colectomy N/A 06/30/2013    Procedure: LAPAROSCOPIC RECTOPEXY WITH SIGMOID COLECTOMY;  Surgeon: Leighton Ruff, MD;  Location: [facility];  Service: General;  Laterality: N/A;    There were no vitals filed for this visit.  Visit Diagnosis:  Lack of coordination  Risk for falls  Difficulty walking      Subjective Assessment - 10/03/14 1028    Subjective Pt has not been seen since eval due to scheduling and transportation reasons. She now plans to be here consistently with the same caregiver.   Currently in Pain? No/denies            [facility] PT Assessment - 10/03/14 0001    Standardized Balance Assessment   Standardized Balance Assessment Dynamic Gait Index   Dynamic Gait Index   Level Surface Mild Impairment   Change in Gait Speed  Normal   Gait with Horizontal Head Turns Mild Impairment   Gait with Vertical Head Turns Mild Impairment   Gait and Pivot Turn Moderate Impairment   Step Over Obstacle Normal   Step Around Obstacles Normal   Steps Mild Impairment   Total Score 18     Completed DGI and OTAGO exercises see flow sheet for details                   Balance Exercises - 10/03/14 1041    OTAGO PROGRAM   Head Movements Standing;5 reps   Neck Movements --  unable to perform correctly   Back Extension 5 reps  one hand on counter   Trunk Movements 5 reps  assist to stabilize hips   Ankle Movements 10 reps;Sitting   Knee Extensor 10 reps;Weight (comment)  3#   Knee Flexor 10 reps;Weight (comment)  3#   Hip ABductor 10 reps;Weight (comment)  3#   Ankle Plantorflexors 20 reps, support   Ankle Dorsiflexors --  with contact guard and 3#                PT Long Term Goals - 10/03/14 1213    PT LONG TERM GOAL #1   Title With assistance of family or caregiver pt will demonstrate correct performance of HEP.  Target 10/26/14   PT LONG TERM GOAL #2   Title Increase Berg Balance Test score to 50/56 for decreased fall risk. Target 10/26/14   PT LONG TERM GOAL #3   Title Complete increase DGI to 21/24 for decreased fall risk. Taret 10/26/14   PT LONG TERM GOAL #4   Title Family member or caregiver will report that pt has greater ease and decreased retropulsion with rising from her couch. Target 10/26/14               Plan - 10/03/14 1215    Clinical Impression Statement Pt demonstrated great participation in therapy session but has impulsivity which limits her safety. For example, when asked to demonstrate how she ascends and descends stairs, pt very rapidly approached the stairs and without using the rails, began to ascend them with unsteadines noted and assist required to ensure safety. Able to repeat safely with rails with verbal cues. Pt expected to benefit form skilled PT services.  Continue per POC.,    PT Next Visit Plan Continue teaching [place], also work on compliant surface exercises with eyes closed.        Problem List Patient Active Problem List   Diagnosis Date Noted  . Rectal prolapse 06/28/2013  . Vasovagal syncope 06/28/2013  . GI bleed 06/27/2013  . Rectal bleed 06/27/2013  . Dyslipidemia 06/27/2013  . Hypothyroidism 06/27/2013  . Anxiety and depression 06/27/2013  . Dementia 06/27/2013  . Neuropathic pain 06/27/2013   Delrae Sawyers, PT,DPT,NCS 10/03/2014 12:24 PM Phone [phone] FAX [phone]       [facility] [address] [place], [place], [address] Phone: [phone]   Fax:  [phone]

## 2014-10-05 ENCOUNTER — Encounter: Payer: Self-pay | Admitting: Physical Therapy

## 2014-10-05 ENCOUNTER — Ambulatory Visit: Payer: Medicare Other | Admitting: Physical Therapy

## 2014-10-05 DIAGNOSIS — Z9181 History of falling: Secondary | ICD-10-CM

## 2014-10-05 DIAGNOSIS — R262 Difficulty in walking, not elsewhere classified: Secondary | ICD-10-CM

## 2014-10-05 DIAGNOSIS — R279 Unspecified lack of coordination: Secondary | ICD-10-CM

## 2014-10-05 NOTE — Therapy (Signed)
[facility] [address] [place], [place], [address] Phone: [phone]   Fax:  [phone]  Physical Therapy Treatment  Patient Details  Name: Erika Whitney MRN: [id number] Date of Birth: 04-06-1925 Referring Provider:  Stephens Shire, MD  Encounter Date: 10/05/2014      PT End of Session - 10/05/14 1104    Visit Number 3   Number of Visits 9   Date for PT Re-Evaluation 10/26/14   Authorization Type Medicare-G code every 10th visit   PT Start Time 1102   PT Stop Time 1144   PT Time Calculation (min) 42 min   Equipment Utilized During Treatment Gait belt   Activity Tolerance Patient tolerated treatment well      Past Medical History  Diagnosis Date  . Rectal prolapse   . Dementia   . Cancer     BREAST  . Arthritis     Past Surgical History  Procedure Laterality Date  . Abdominal hysterectomy    . Cesarean section    . Appendectomy    . Left mastectomy    . Mastectomy    . Colonoscopy N/A 06/28/2013    Procedure: COLONOSCOPY;  Surgeon: Beryle Beams, MD;  [facility];  Service: Endoscopy;  Laterality: N/A;  . Laparoscopic sigmoid colectomy N/A 06/30/2013    Procedure: LAPAROSCOPIC RECTOPEXY WITH SIGMOID COLECTOMY;  Surgeon: Leighton Ruff, MD;  Location: [facility];  Service: General;  Laterality: N/A;    There were no vitals filed for this visit.  Visit Diagnosis:  Lack of coordination  Risk for falls  Difficulty walking      Subjective Assessment - 10/05/14 1104    Subjective No new complaints. No falls or pain to report.           Balance Exercises - 10/05/14 1106    OTAGO PROGRAM   Head Movements Sitting;5 reps   Back Extension Standing;5 reps   Trunk Movements Standing;5 reps   Ankle Movements Sitting;10 reps   Knee Extensor 10 reps;Weight (comment)  3# bil legs   Knee Flexor 10 reps;Weight (comment)  3# weights alternating legs   Hip ABductor 10 reps;Weight  (comment)  3# weights alternating legs   Ankle Plantorflexors 20 reps, support  3# ankle weights   Ankle Dorsiflexors 20 reps, support  3# ankle weights   Knee Bends 10 reps, support   Backwards Walking Support  min guard assist   Walking and Turning Around No assistive device  min guard assist   Sideways Walking No assistive device   Tandem Stance 10 seconds, no support   Tandem Walk No support  min guard assist   One Leg Stand 10 seconds, support   Heel Walking No support   Toe Walk No support   Sit to Stand 10 reps, bilateral support           PT Long Term Goals - 10/03/14 1213    PT LONG TERM GOAL #1   Title With assistance of family or caregiver pt will demonstrate correct performance of HEP. Target 10/26/14   PT LONG TERM GOAL #2   Title Increase Berg Balance Test score to 50/56 for decreased fall risk. Target 10/26/14   PT LONG TERM GOAL #3   Title Complete increase DGI to 21/24 for decreased fall risk. Taret 10/26/14   PT LONG TERM GOAL #4   Title Family member or caregiver will report that pt has greater ease and decreased retropulsion with rising from her  couch. Target 10/26/14           Plan - 10/05/14 1105    Clinical Impression Statement Completed OTAGO program today with pt and caregiver. Pt needs constanct cues to slow down for safety, however very participatory today with therapy.   Pt will benefit from skilled therapeutic intervention in order to improve on the following deficits Decreased coordination;Decreased strength;Postural dysfunction;Decreased balance;Difficulty walking   Rehab Potential Good   PT Frequency 2x / week   PT Duration 2 weeks   PT Treatment/Interventions ADLs/Self Care Home Management;Gait training;Therapeutic exercise;Patient/family education;Stair training;Balance training;Neuromuscular re-education;Therapeutic activities;DME Instruction   PT Next Visit Plan work on compliant surface exercises with eyes closed, dynamic gait/balance on  compliant surfaces   Consulted and Agree with Plan of Care Family member/caregiver;Patient        Problem List Patient Active Problem List   Diagnosis Date Noted  . Rectal prolapse 06/28/2013  . Vasovagal syncope 06/28/2013  . GI bleed 06/27/2013  . Rectal bleed 06/27/2013  . Dyslipidemia 06/27/2013  . Hypothyroidism 06/27/2013  . Anxiety and depression 06/27/2013  . Dementia 06/27/2013  . Neuropathic pain 06/27/2013    Willow Ora 10/05/2014, 11:51 AM  Willow Ora, PTA, [facility] [address], [place], [place] [address] [phone] 10/05/2014, 11:51 AM

## 2014-10-10 ENCOUNTER — Ambulatory Visit: Payer: Medicare Other

## 2014-10-10 DIAGNOSIS — R279 Unspecified lack of coordination: Secondary | ICD-10-CM

## 2014-10-10 DIAGNOSIS — Z9181 History of falling: Secondary | ICD-10-CM

## 2014-10-10 NOTE — Therapy (Signed)
[facility] [address] [place], [place], [address] Phone: [phone]   Fax:  [phone]  Physical Therapy Treatment  Patient Details  Name: Erika Whitney MRN: [id number] Date of Birth: Oct 12, 1924 Referring Provider:  Stephens Shire, MD  Encounter Date: 10/10/2014    Past Medical History  Diagnosis Date  . Rectal prolapse   . Dementia   . Cancer     BREAST  . Arthritis     Past Surgical History  Procedure Laterality Date  . Abdominal hysterectomy    . Cesarean section    . Appendectomy    . Left mastectomy    . Mastectomy    . Colonoscopy N/A 06/28/2013    Procedure: COLONOSCOPY;  Surgeon: Beryle Beams, MD;  [facility];  Service: Endoscopy;  Laterality: N/A;  . Laparoscopic sigmoid colectomy N/A 06/30/2013    Procedure: LAPAROSCOPIC RECTOPEXY WITH SIGMOID COLECTOMY;  Surgeon: Leighton Ruff, MD;  Location: [facility];  Service: General;  Laterality: N/A;    There were no vitals filed for this visit.  Visit Diagnosis:  No diagnosis found.      Subjective Assessment - 10/10/14 1022    Subjective No new complaints. No falls or pain to report.   Currently in Pain? No/denies       Standing on compliant surface-airex square -eyes open with head turns while playing "I spy" to observe the environment -eyes closed with head turns horizontal and vertical MIN A to steady -eyes open then closed marching on airex, the added BUE large amplitude movements--MIN A to steady -turning 360 degrees 4x left and 4x right with MIN A progressing to CGA to steady with eyes open -minisquats with large amplitude BUE movement on airex with MIN A and eyes oepnx15 then eyes closed x15 -lateral walkingon compliant balance beam with MOD A due to losses of balance and verbal cues to slow down  Stepping over 1" by 4" obstacle forwrad and lateral with intermittent MIN A to steady and therapist demo for correct form.  Verbal cues to slow down  Anterior/posterior limits of stability with support behind buttocks and with CGA  Reaching to look behind her for turning balance challenge                          PT Long Term Goals - 10/03/14 1213    PT LONG TERM GOAL #1   Title With assistance of family or caregiver pt will demonstrate correct performance of HEP. Target 10/26/14   PT LONG TERM GOAL #2   Title Increase Berg Balance Test score to 50/56 for decreased fall risk. Target 10/26/14   PT LONG TERM GOAL #3   Title Complete increase DGI to 21/24 for decreased fall risk. Taret 10/26/14   PT LONG TERM GOAL #4   Title Family member or caregiver will report that pt has greater ease and decreased retropulsion with rising from her couch. Target 10/26/14               Problem List Patient Active Problem List   Diagnosis Date Noted  . Rectal prolapse 06/28/2013  . Vasovagal syncope 06/28/2013  . GI bleed 06/27/2013  . Rectal bleed 06/27/2013  . Dyslipidemia 06/27/2013  . Hypothyroidism 06/27/2013  . Anxiety and depression 06/27/2013  . Dementia 06/27/2013  . Neuropathic pain 06/27/2013   Delrae Sawyers, PT,DPT,NCS 10/10/2014 12:13 PM Phone [phone] FAX [phone]  [facility] [address] [place], [place], [address] Phone: [phone]   Fax:  [phone]

## 2014-10-12 ENCOUNTER — Ambulatory Visit: Payer: Medicare Other

## 2014-10-12 DIAGNOSIS — Z9181 History of falling: Secondary | ICD-10-CM

## 2014-10-12 DIAGNOSIS — R279 Unspecified lack of coordination: Secondary | ICD-10-CM | POA: Diagnosis not present

## 2014-10-12 NOTE — Therapy (Signed)
[facility] [address] [place], [place], [address] Phone: [phone]   Fax:  [phone]  Physical Therapy Treatment  Patient Details  Name: Erika Whitney MRN: [id number] Date of Birth: 10/01/1924 Referring Provider:  Stephens Shire, MD  Encounter Date: 10/12/2014      PT End of Session - 10/12/14 1112    Visit Number 5   Number of Visits 9   Date for PT Re-Evaluation 10/26/14   Authorization Type Medicare-G code every 10th visit   PT Start Time 1021   PT Stop Time 1100   PT Time Calculation (min) 39 min      Past Medical History  Diagnosis Date  . Rectal prolapse   . Dementia   . Cancer     BREAST  . Arthritis     Past Surgical History  Procedure Laterality Date  . Abdominal hysterectomy    . Cesarean section    . Appendectomy    . Left mastectomy    . Mastectomy    . Colonoscopy N/A 06/28/2013    Procedure: COLONOSCOPY;  Surgeon: Beryle Beams, MD;  Location: [facility];  Service: Endoscopy;  Laterality: N/A;  . Laparoscopic sigmoid colectomy N/A 06/30/2013    Procedure: LAPAROSCOPIC RECTOPEXY WITH SIGMOID COLECTOMY;  Surgeon: Leighton Ruff, MD;  Location: [facility];  Service: General;  Laterality: N/A;    There were no vitals filed for this visit.  Visit Diagnosis:  Risk for falls  Lack of coordination      Subjective Assessment - 10/12/14 1025    Subjective Pts caregiver reports she will be moving to an assisted living facility today after the therapy session.             [facility] PT Assessment - 10/12/14 0001    Berg Balance Test   Sit to Stand Able to stand without using hands and stabilize independently   Standing Unsupported Able to stand safely 2 minutes   Sitting with Back Unsupported but Feet Supported on Floor or Stool Able to sit safely and securely 2 minutes   Stand to Sit Sits safely with minimal use of hands   Transfers Able to transfer safely, minor use of hands    Standing Unsupported with Eyes Closed Able to stand 10 seconds safely   Standing Ubsupported with Feet Together Able to place feet together independently and stand 1 minute safely   From Standing, Reach Forward with Outstretched Arm Can reach forward >12 cm safely (5")   From Standing Position, Pick up Object from Floor Able to pick up shoe, needs supervision   From Standing Position, Turn to Look Behind Over each Shoulder Looks behind from both sides and weight shifts well   Turn 360 Degrees Able to turn 360 degrees safely one side only in 4 seconds or less   Standing Unsupported, Alternately Place Feet on Step/Stool Able to stand independently and safely and complete 8 steps in 20 seconds   Standing Unsupported, One Foot in Front Able to plae foot ahead of the other independently and hold 30 seconds   Standing on One Leg Tries to lift leg/unable to hold 3 seconds but remains standing independently   Total Score 49     Completed Berg Balance Test and scored 49 today. Pt and caregiver are independent with HEP however pt will no longer have caregiver with her once she moves to the assisted living facility this afternoon.   Lateral walking on compliant balance beam without UE  support with intermittent CGA x5 laps left and right  Tandem walking on compliant balance beam with BUE support on parallel bars and supervision x5 laps forward/backward  Ball toss/catch  on compliant balance beam with feet apart initially tossing forward, then around the world with CGA  Ball toss/catch on BOSU ball with MOD A to steady, downgraded to static standing on BOSU ball with MIN A to steady. Downgraded further to static standing on inverted BOSU ball with MIN A to supervision.                            PT Long Term Goals - 10/12/14 1027    PT LONG TERM GOAL #1   Title With assistance of family or caregiver pt will demonstrate correct performance of HEP. Target 10/26/14   Status Achieved    PT LONG TERM GOAL #2   Title Increase Berg Balance Test score to 50/56 for decreased fall risk. Target 10/26/14   Status --  49/56 on 10/12/14   PT LONG TERM GOAL #3   Title Complete increase DGI to 21/24 for decreased fall risk. Taret 10/26/14   PT LONG TERM GOAL #4   Title Family member or caregiver will report that pt has greater ease and decreased retropulsion with rising from her couch. Target 10/26/14               Plan - 10/12/14 1112    Clinical Impression Statement Pt continues to make progress with balance and also demonstrated increased ability to terminate activity on command and to slow down on command. Pt's caregiver reports pt will be moving to an assisited living facility where they have PT. I explained to caregiver that pt will not be able to receive PTT from  two different providers as insurance will only pay for one. Caregiver reports she wasn;t going to be receiving PT immediately. She plans to continue with PT at this facility as scheduled.   PT Next Visit Plan Gait/dynamic balance on compliant surfaces        Problem List Patient Active Problem List   Diagnosis Date Noted  . Rectal prolapse 06/28/2013  . Vasovagal syncope 06/28/2013  . GI bleed 06/27/2013  . Rectal bleed 06/27/2013  . Dyslipidemia 06/27/2013  . Hypothyroidism 06/27/2013  . Anxiety and depression 06/27/2013  . Dementia 06/27/2013  . Neuropathic pain 06/27/2013   Delrae Sawyers, PT,DPT,NCS 10/12/2014 11:19 AM Phone [phone] FAX [phone]         [facility] [address] [place], [place], [address] Phone: [phone]   Fax:  [phone]

## 2014-10-17 ENCOUNTER — Encounter: Payer: Self-pay | Admitting: Physical Therapy

## 2014-10-17 ENCOUNTER — Ambulatory Visit: Payer: Medicare Other | Admitting: Physical Therapy

## 2014-10-17 DIAGNOSIS — R262 Difficulty in walking, not elsewhere classified: Secondary | ICD-10-CM

## 2014-10-17 DIAGNOSIS — R279 Unspecified lack of coordination: Secondary | ICD-10-CM | POA: Diagnosis not present

## 2014-10-17 DIAGNOSIS — Z9181 History of falling: Secondary | ICD-10-CM

## 2014-10-17 NOTE — Therapy (Signed)
[facility] [address] [place], [place], [address] Phone: [phone]   Fax:  [phone]  Physical Therapy Treatment  Patient Details  Name: Erika Whitney MRN: [id number] Date of Birth: Nov 25, 1924 Referring Provider:  Stephens Shire, MD  Encounter Date: 10/17/2014      PT End of Session - 10/17/14 1027    Visit Number 6   Number of Visits 9   Date for PT Re-Evaluation 10/26/14   Authorization Type Medicare-G code every 10th visit   PT Start Time 1015   PT Stop Time 1056   PT Time Calculation (min) 41 min   Equipment Utilized During Treatment Gait belt   Activity Tolerance Patient tolerated treatment well      Past Medical History  Diagnosis Date  . Rectal prolapse   . Dementia   . Cancer     BREAST  . Arthritis     Past Surgical History  Procedure Laterality Date  . Abdominal hysterectomy    . Cesarean section    . Appendectomy    . Left mastectomy    . Mastectomy    . Colonoscopy N/A 06/28/2013    Procedure: COLONOSCOPY;  Surgeon: Beryle Beams, MD;  Location: [facility];  Service: Endoscopy;  Laterality: N/A;  . Laparoscopic sigmoid colectomy N/A 06/30/2013    Procedure: LAPAROSCOPIC RECTOPEXY WITH SIGMOID COLECTOMY;  Surgeon: Leighton Ruff, MD;  Location: [facility];  Service: General;  Laterality: N/A;    There were no vitals filed for this visit.  Visit Diagnosis:  Risk for falls  Lack of coordination  Difficulty walking      Subjective Assessment - 10/17/14 1024    Subjective Pt now in the assisted living center. Per caregiver seems to be doing well there due to various activities and with meeting new people .Has a backyard/courtyard that is secure and open at all times to residents. No falls to report. Caregiver unsure of if pt is doing her HEP as she is no longer with her full time, only on Wed and Friday at this time to transport her to therapy.                                         Currently in Pain? No/denies          [facility] Adult PT Treatment/Exercise - 10/17/14 1028    Ambulation/Gait   Ambulation/Gait Yes   Ambulation/Gait Assistance 5: Supervision;4: Min guard   Ambulation/Gait Assistance Details no assist needed, occasional cues needed to slow down for safety and for increased foot clearance with gait, especially on inclines/declines.                                        Ambulation Distance (Feet) 1000 Feet   Assistive device None   Gait Pattern Step-through pattern;Decreased stride length;Shuffle   Ambulation Surface Level;Unlevel;Indoor;Outdoor;Paved;Gravel;Grass   Stairs Yes   Stairs Assistance 4: Min assist;5: Supervision   Stairs Assistance Details (indicate cue type and reason) pt with unsteady pattern and decreased safety on 1st rep up/down stairs with no rails. after cues to use rails pt with increased safety and stability    Stair Management Technique No rails;Two rails;Alternating pattern   Number of Stairs 4  x 3 reps   Dynamic Standing Balance  Dynamic Standing - Balance Support No upper extremity supported;During functional activity   Dynamic Standing - Level of Assistance 4: Min assist;3: Mod assist   Dynamic Standing - Balance Activities Compliant surfaces;Alternating  foot traps  sit/stands with feet across blue foam beam   Dynamic Standing - Comments standing on blue foam mat: alternating forward toe taps, cross toe taps, forward double toe taps and cross double taps x 10 each with up to min assist for balance.                                    High Level Balance   High Level Balance Activities Side stepping;Tandem walking   High Level Balance Comments on blue foam beam: side stepping left/right, tandem gait forward/backwards with intermittent UE assist x 3 laps each way;seated with feet across foam beam: sit/stands 2 sets fo 10 reps with cues to decrease retrovulsion.                                            PT Long Term Goals  - 10/12/14 1027    PT LONG TERM GOAL #1   Title With assistance of family or caregiver pt will demonstrate correct performance of HEP. Target 10/26/14   Status Achieved   PT LONG TERM GOAL #2   Title Increase Berg Balance Test score to 50/56 for decreased fall risk. Target 10/26/14   Status --  49/56 on 10/12/14   PT LONG TERM GOAL #3   Title Complete increase DGI to 21/24 for decreased fall risk. Taret 10/26/14   PT LONG TERM GOAL #4   Title Family member or caregiver will report that pt has greater ease and decreased retropulsion with rising from her couch. Target 10/26/14           Plan - 10/17/14 1027    Clinical Impression Statement Pt making steady progress toward goals.   Pt will benefit from skilled therapeutic intervention in order to improve on the following deficits Decreased coordination;Decreased strength;Postural dysfunction;Decreased balance;Difficulty walking   Rehab Potential Good   PT Frequency 2x / week   PT Duration 2 weeks   PT Treatment/Interventions ADLs/Self Care Home Management;Gait training;Therapeutic exercise;Patient/family education;Stair training;Balance training;Neuromuscular re-education;Therapeutic activities;DME Instruction   PT Next Visit Plan work on compliant surface exercises with eyes closed, dynamic gait/balance on compliant surfaces   Consulted and Agree with Plan of Care Family member/caregiver;Patient        Problem List Patient Active Problem List   Diagnosis Date Noted  . Rectal prolapse 06/28/2013  . Vasovagal syncope 06/28/2013  . GI bleed 06/27/2013  . Rectal bleed 06/27/2013  . Dyslipidemia 06/27/2013  . Hypothyroidism 06/27/2013  . Anxiety and depression 06/27/2013  . Dementia 06/27/2013  . Neuropathic pain 06/27/2013    Willow Ora 10/17/2014, 8:33 PM  Willow Ora, PTA, [facility] [address], [place], [place] [address] [phone] 10/17/2014, 8:33 PM

## 2014-10-19 ENCOUNTER — Ambulatory Visit: Payer: Medicare Other

## 2014-10-19 DIAGNOSIS — R262 Difficulty in walking, not elsewhere classified: Secondary | ICD-10-CM

## 2014-10-19 DIAGNOSIS — Z9181 History of falling: Secondary | ICD-10-CM

## 2014-10-19 DIAGNOSIS — R279 Unspecified lack of coordination: Secondary | ICD-10-CM

## 2014-10-19 NOTE — Therapy (Signed)
[facility] [address] [place], [place], [address] Phone: [phone]   Fax:  [phone]  Physical Therapy Treatment  Patient Details  Name: Erika Whitney MRN: [id number] Date of Birth: [birth year] Referring Provider:  Stephens Shire, MD  Encounter Date: 10/19/2014      PT End of Session - 10/19/14 1209    Visit Number 7   Number of Visits 9   Date for PT Re-Evaluation 10/26/14   Authorization Type Medicare-G code every 10th visit   PT Start Time 1022   PT Stop Time 1101   PT Time Calculation (min) 39 min      Past Medical History  Diagnosis Date  . Rectal prolapse   . Dementia   . Cancer     BREAST  . Arthritis     Past Surgical History  Procedure Laterality Date  . Abdominal hysterectomy    . Cesarean section    . Appendectomy    . Left mastectomy    . Mastectomy    . Colonoscopy N/A 06/28/2013    Procedure: COLONOSCOPY;  Surgeon: Beryle Beams, MD;  Location: [facility];  Service: Endoscopy;  Laterality: N/A;  . Laparoscopic sigmoid colectomy N/A 06/30/2013    Procedure: LAPAROSCOPIC RECTOPEXY WITH SIGMOID COLECTOMY;  Surgeon: Leighton Ruff, MD;  Location: [facility];  Service: General;  Laterality: N/A;    There were no vitals filed for this visit.  Visit Diagnosis:  Lack of coordination  Risk for falls  Difficulty walking      Subjective Assessment - 10/19/14 1023    Subjective Pt reports that she is doing "GREAT!" Caregiver reports she is thriving in new environment and much more social   Currently in Pain? No/denies     Therex: 3 minute power walk warm up with large amplitude arm swing 5 minutes general strength and endurance training level 3 all extremities on nu-step   Neuro re-ed:  Compliant surface -Walking forward/backward 3 laps with eyes open, then closed -side stepping right and left with eyes open then closed, -marching forward and backward with eyes open then  closed -toe walking forward and backward with eyes open only  Airex: Star drill eyes open with CGA Turning to look over shoulder with intermittent UE support Lateral weight shifting with feet together eyes open and eyes closed Standing feet together and eyes closed with CGA to steady  Standing with feet apart on compliant stepping stones: -minisquats x10 with UE support -lateral weight shift -head turns horizontal and vertical with eyes closed                                 PT Long Term Goals - 10/12/14 1027    PT LONG TERM GOAL #1   Title With assistance of family or caregiver pt will demonstrate correct performance of HEP. Target 10/26/14   Status Achieved   PT LONG TERM GOAL #2   Title Increase Berg Balance Test score to 50/56 for decreased fall risk. Target 10/26/14   Status --  49/56 on 10/12/14   PT LONG TERM GOAL #3   Title Complete increase DGI to 21/24 for decreased fall risk. Taret 10/26/14   PT LONG TERM GOAL #4   Title Family member or caregiver will report that pt has greater ease and decreased retropulsion with rising from her couch. Target 10/26/14  Plan - 10/19/14 1210    Clinical Impression Statement Pt continues to make progress.  Balance is improving, coordination is improving and this is especially notable by improved ability to terminate activity on command   PT Next Visit Plan begin checking LTGs-plan for d/c next week   Consulted and Agree with Plan of Care Patient        Problem List Patient Active Problem List   Diagnosis Date Noted  . Rectal prolapse 06/28/2013  . Vasovagal syncope 06/28/2013  . GI bleed 06/27/2013  . Rectal bleed 06/27/2013  . Dyslipidemia 06/27/2013  . Hypothyroidism 06/27/2013  . Anxiety and depression 06/27/2013  . Dementia 06/27/2013  . Neuropathic pain 06/27/2013    Delrae Sawyers, PT,DPT,NCS 10/19/2014 12:13 PM Phone [phone] FAX [phone]           [facility] [address] [place], [place], [address] Phone: [phone]   Fax:  [phone]

## 2014-10-24 ENCOUNTER — Ambulatory Visit: Payer: Medicare Other

## 2014-10-24 VITALS — BP 132/72 | HR 67

## 2014-10-24 DIAGNOSIS — R279 Unspecified lack of coordination: Secondary | ICD-10-CM | POA: Diagnosis not present

## 2014-10-24 DIAGNOSIS — Z9181 History of falling: Secondary | ICD-10-CM

## 2014-10-24 DIAGNOSIS — R262 Difficulty in walking, not elsewhere classified: Secondary | ICD-10-CM
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
## 2014-10-26 ENCOUNTER — Encounter: Payer: Self-pay | Admitting: Physical Therapy

## 2014-10-26 ENCOUNTER — Ambulatory Visit: Payer: Medicare Other | Admitting: Physical Therapy

## 2014-10-26 DIAGNOSIS — R279 Unspecified lack of coordination: Secondary | ICD-10-CM | POA: Diagnosis not present

## 2014-10-26 DIAGNOSIS — Z9181 History of falling: Secondary | ICD-10-CM

## 2014-10-26 DIAGNOSIS — R262 Difficulty in walking, not elsewhere classified: Secondary | ICD-10-CM

## 2014-10-26 NOTE — Therapy (Addendum)
[facility] [address] [place], [place], [address] Phone: [phone]   Fax:  [phone]  Physical Therapy Treatment  Patient Details  Name: Erika Whitney MRN: [id number] Date of Birth: 07/03/1924 Referring Provider:  Stephens Shire, MD  Encounter Date: 10/26/2014      PT End of Session - 10/26/14 1239    Visit Number 9   Number of Visits 9   Date for PT Re-Evaluation 10/26/14   Authorization Type Medicare-G code every 10th visit   PT Start Time 1235   PT Stop Time 1251   PT Time Calculation (min) 16 min      Past Medical History  Diagnosis Date  . Rectal prolapse   . Dementia   . Cancer     BREAST  . Arthritis     Past Surgical History  Procedure Laterality Date  . Abdominal hysterectomy    . Cesarean section    . Appendectomy    . Left mastectomy    . Mastectomy    . Colonoscopy N/A 06/28/2013    Procedure: COLONOSCOPY;  Surgeon: Beryle Beams, MD;  Location: [facility];  Service: Endoscopy;  Laterality: N/A;  . Laparoscopic sigmoid colectomy N/A 06/30/2013    Procedure: LAPAROSCOPIC RECTOPEXY WITH SIGMOID COLECTOMY;  Surgeon: Leighton Ruff, MD;  Location: [facility];  Service: General;  Laterality: N/A;    There were no vitals filed for this visit.  Visit Diagnosis:  Risk for falls  Difficulty walking      Subjective Assessment - 10/26/14 1237    Subjective Daughter reports she has not been doing OTAGO program daily due to staff at ALF will not work with her with them due to not seeing a PT at their facility.    Currently in Pain? No/denies     Treatment: Self Care: Discussed/reviewed current HEP of OTAGO and walking program with daughter/patient. Discussed pt's progress toward and that she met her goals with improvements on Berg Balance test and dynamic gait index on last visit. Pt's daughter is considering having a PT eval done at the ALF so she can get the staff on board with helping  the pt with her HEP, as of now they will not help with OTAGO due to pt not being seen by their therapist. Both were agreeable to discharge here.        PT Long Term Goals - 10/26/14 1240    PT LONG TERM GOAL #1   Title With assistance of family or caregiver pt will demonstrate correct performance of HEP. Target 10/26/14   Status Achieved   PT LONG TERM GOAL #2   Title Increase Berg Balance Test score to 50/56 for decreased fall risk. Target 10/26/14   Status Achieved  49/56 on 10/12/14   PT LONG TERM GOAL #3   Title Complete increase DGI to 21/24 for decreased fall risk. Taret 10/26/14   Status Achieved   PT LONG TERM GOAL #4   Title Family member or caregiver will report that pt has greater ease and decreased retropulsion with rising from her couch. Target 10/26/14   Status Achieved           Plan - 10/26/14 1239    Clinical Impression Statement All goals met, all questions addressed of daughter's. Pt to continue with OTAGO program 3-5 times a week and walking daily for continued fitness. Pt also participating in group activities at ALF. No new bruising or issues noted on patient.  Pt will benefit from skilled therapeutic intervention in order to improve on the following deficits Decreased coordination;Decreased strength;Postural dysfunction;Decreased balance;Difficulty walking   Rehab Potential Good   PT Frequency 2x / week   PT Duration 2 weeks   PT Treatment/Interventions ADLs/Self Care Home Management;Gait training;Therapeutic exercise;Patient/family education;Stair training;Balance training;Neuromuscular re-education;Therapeutic activities;DME Instruction   PT Next Visit Plan Discharge per PT plan of care   Consulted and Agree with Plan of Care Patient;Family member/caregiver   Family Member Consulted daughter     G-codeMerrilee Jansky 50/56, DGI: 21/24, Pt no longer has difficulty rising from couch. Mobility G-code goal status: CI Mobility G-code discharge status:  CI  Completed by PT. Geoffry Paradise, PT,DPT 10/26/2014 3:20 PM Phone: [phone] Fax: [phone]    Problem List Patient Active Problem List   Diagnosis Date Noted  . Rectal prolapse 06/28/2013  . Vasovagal syncope 06/28/2013  . GI bleed 06/27/2013  . Rectal bleed 06/27/2013  . Dyslipidemia 06/27/2013  . Hypothyroidism 06/27/2013  . Anxiety and depression 06/27/2013  . Dementia 06/27/2013  . Neuropathic pain 06/27/2013    Miller,Jennifer L 10/26/2014, 3:17 PM  Willow Ora, PTA, [facility] [address] [place], [place] [address] [phone] 10/26/2014, 3:17 PM

## 2015-02-15 ENCOUNTER — Encounter: Payer: Self-pay | Admitting: Rehabilitative and Restorative Service Providers"

## 2015-02-15 NOTE — Therapy (Signed)
[facility] [address] [place], [place], [address] Phone: [phone]   Fax:  [phone]  Patient Details  Name: Mintie Witherington MRN: [id number] Date of Birth: [birth year] Referring Provider:  No ref. provider found  Encounter Date: last encounter 10/26/2014  PHYSICAL THERAPY DISCHARGE SUMMARY  Visits from Start of Care: 9   Current functional level related to goals / functional outcomes:       PT Long Term Goals - 10/26/14 1240    PT LONG TERM GOAL #1   Title With assistance of family or caregiver pt will demonstrate correct performance of HEP. Target 10/26/14   Status Achieved   PT LONG TERM GOAL #2   Title Increase Berg Balance Test score to 50/56 for decreased fall risk. Target 10/26/14   Status Achieved  49/56 on 10/12/14   PT LONG TERM GOAL #3   Title Complete increase DGI to 21/24 for decreased fall risk. Taret 10/26/14   Status Achieved   PT LONG TERM GOAL #4   Title Family member or caregiver will report that pt has greater ease and decreased retropulsion with rising from her couch. Target 10/26/14   Status Achieved        Remaining deficits: Impulsivity, fast movement   Education / Equipment: HEP, otago, fall prevention, safety.  Plan: Patient agrees to discharge.  Patient goals were met. Patient is being discharged due to meeting the stated rehab goals.  ?????       D/c summary completed per chart review.  Thank you for the referral of this patient. Rudell Cobb, MPT  [place] 02/15/2015, 11:57 AM  [facility] [address] [place], [place], [address] Phone: [phone]   Fax:  [phone]

## 2015-05-26 ENCOUNTER — Encounter (HOSPITAL_COMMUNITY): Payer: Self-pay | Admitting: Emergency Medicine

## 2015-05-26 ENCOUNTER — Emergency Department (HOSPITAL_COMMUNITY)
Admission: EM | Admit: 2015-05-26 | Discharge: 2015-05-26 | Disposition: A | Discharge status: Home / Self Care | Payer: Medicare Other | Attending: Emergency Medicine | Admitting: Emergency Medicine

## 2015-05-26 ENCOUNTER — Emergency Department (HOSPITAL_COMMUNITY): Payer: Medicare Other

## 2015-05-26 DIAGNOSIS — M1990 Unspecified osteoarthritis, unspecified site: Secondary | ICD-10-CM | POA: Diagnosis not present

## 2015-05-26 DIAGNOSIS — Z853 Personal history of malignant neoplasm of breast: Secondary | ICD-10-CM | POA: Diagnosis not present

## 2015-05-26 DIAGNOSIS — Z79899 Other long term (current) drug therapy: Secondary | ICD-10-CM | POA: Diagnosis not present

## 2015-05-26 DIAGNOSIS — F0390 Unspecified dementia without behavioral disturbance: Secondary | ICD-10-CM | POA: Insufficient documentation

## 2015-05-26 DIAGNOSIS — R0989 Other specified symptoms and signs involving the circulatory and respiratory systems: Secondary | ICD-10-CM | POA: Diagnosis present

## 2015-05-26 DIAGNOSIS — Z8719 Personal history of other diseases of the digestive system: Secondary | ICD-10-CM | POA: Diagnosis not present

## 2015-05-26 DIAGNOSIS — T17308A Unspecified foreign body in larynx causing other injury, initial encounter: Secondary | ICD-10-CM

## 2015-05-26 DIAGNOSIS — R0682 Tachypnea, not elsewhere classified: Secondary | ICD-10-CM | POA: Insufficient documentation

## 2015-05-26 DIAGNOSIS — Z7982 Long term (current) use of aspirin: Secondary | ICD-10-CM | POA: Diagnosis not present

## 2015-05-26 MED ORDER — AZITHROMYCIN 250 MG PO TABS
500.0000 mg | ORAL_TABLET | Freq: Once | ORAL | Status: AC
  Administered 2015-05-26: 500 mg via ORAL
  Filled 2015-05-26: qty 2

## 2015-05-26 MED ORDER — AZITHROMYCIN 250 MG PO TABS: 250.0000 mg | ORAL_TABLET | Freq: Every day | ORAL | Status: AC

## 2015-05-26 MED ORDER — DEXTROMETHORPHAN-GUAIFENESIN 10-100 MG/5ML PO LIQD: 5.0000 mL | Freq: Four times a day (QID) | ORAL | Status: AC | PRN

## 2015-05-26 NOTE — Discharge Instructions (Signed)
As discussed, with your mother's episode of choking, and her subsequent increased respiratory effort, she will be started on a course of antibiotics.  Please be sure that she take all medication as directed, and follows up with her primary care physician.  Return here for concerning changes in her condition.

## 2015-05-26 NOTE — ED Provider Notes (Signed)
CSN: [id number]     Arrival date & time 05/26/15  1236 History   First MD Initiated Contact with Patient 05/26/15 1238     Chief Complaint  Patient presents with  . Choking     (Consider location/radiation/quality/duration/timing/severity/associated sxs/prior Treatment) HPI Patient presents from her nursing facility after an episode of choking patient arrives via EMS, and history is provided by them. Patient has a history of dementia, level V caveat. Per report, the patient was in her blood sugar, when staff members found the patient choking, with agonal respiration. EMS providers report that with suctioning, they were able to evacuate a large piece of ham.  subsequently, the patient had returned to normal respiratory effort, return to baseline interactivity. However, the patient was found to be hypoxic on room air, requiring supplemental oxygen.  Past Medical History  Diagnosis Date  . Rectal prolapse   . Dementia   . Cancer (HCC)     BREAST  . Arthritis    Past Surgical History  Procedure Laterality Date  . Abdominal hysterectomy    . Cesarean section    . Appendectomy    . Left mastectomy    . Mastectomy    . Colonoscopy N/A 06/28/2013    Procedure: COLONOSCOPY;  Surgeon: Beryle Beams, MD;  Location: [facility];  Service: Endoscopy;  Laterality: N/A;  . Laparoscopic sigmoid colectomy N/A 06/30/2013    Procedure: LAPAROSCOPIC RECTOPEXY WITH SIGMOID COLECTOMY;  Surgeon: Leighton Ruff, MD;  Location: [facility];  Service: General;  Laterality: N/A;   History reviewed. No pertinent family history. Social History  Substance Use Topics  . Smoking status: Never Smoker   . Smokeless tobacco: Never Used  . Alcohol Use: No   OB History    No data available     Review of Systems  Unable to perform ROS: Dementia      Allergies  Ibuprofen  Home Medications   Prior to Admission medications   Medication Sig Start Date End Date Taking? Authorizing Provider  aspirin EC  81 MG tablet Take 81 mg by mouth daily.   Yes Historical Provider, MD  calcium-vitamin D (OSCAL WITH D) 500-200 MG-UNIT per tablet Take 2 tablets by mouth daily.   Yes Historical Provider, MD  citalopram (CELEXA) 20 MG tablet Take 20 mg by mouth daily.   Yes Historical Provider, MD  clonazePAM (KLONOPIN) 0.5 MG tablet Take 0.5 mg by mouth daily.   Yes Historical Provider, MD  donepezil (ARICEPT) 10 MG tablet Take 10 mg by mouth at bedtime.   Yes Historical Provider, MD  glucosamine-chondroitin 500-400 MG tablet Take 1 tablet by mouth 2 (two) times daily.   Yes Historical Provider, MD  levothyroxine (SYNTHROID, LEVOTHROID) 75 MCG tablet Take 112.5 mcg by mouth daily before breakfast. Takes one and one half tablets to = 112.5mg    Yes Historical Provider, MD  loratadine (CLARITIN) 10 MG tablet Take 10 mg by mouth daily as needed for allergies (allergies).   Yes Historical Provider, MD  memantine (NAMENDA) 10 MG tablet Take 10 mg by mouth 2 (two) times daily.   Yes Historical Provider, MD  mirtazapine (REMERON) 15 MG tablet Take 15 mg by mouth at bedtime.   Yes Historical Provider, MD  Multiple Vitamins-Minerals (MULTIVITAMIN PO) Take 1 tablet by mouth daily.   Yes Historical Provider, MD  naproxen sodium (ANAPROX) 220 MG tablet Take 220 mg by mouth 2 (two) times daily as needed (pain).   Yes Historical Provider, MD  Omega-3 Fatty Acids (  FISH OIL) 1000 MG CAPS Take 1 capsule by mouth daily.   Yes Historical Provider, MD  pregabalin (LYRICA) 100 MG capsule Take 100 mg by mouth daily.   Yes Historical Provider, MD  simvastatin (ZOCOR) 10 MG tablet Take 10 mg by mouth daily.   Yes Historical Provider, MD   BP 146/80 mmHg  Pulse 93  Temp(Src) 98.3 F (36.8 C) (Oral)  Resp 25  SpO2 100% Physical Exam  Constitutional: She appears well-developed and well-nourished. No distress.  HENT:  Head: Normocephalic and atraumatic.  Eyes: Conjunctivae and EOM are normal.  Cardiovascular: Normal rate and regular  rhythm.   Pulmonary/Chest: Effort normal and breath sounds normal. No stridor. No respiratory distress.  Abdominal: She exhibits no distension.  Musculoskeletal: She exhibits no edema.  Neurological: She is alert.  Patient does not follow commands reliably, moves all extremity spontaneously, has brief, clear speech  Skin: Skin is warm and dry.  Psychiatric: She is withdrawn. Cognition and memory are impaired.  Nursing note and vitals reviewed.   ED Course  Procedures (including critical care time) Labs Review Labs Reviewed - No data to display  Imaging Review Dg Chest Port 1 View  05/26/2015  CLINICAL DATA:  Choked on piece of ham at lunch today with difficulty breathing. EXAM: PORTABLE CHEST 1 VIEW COMPARISON:  06/27/2013 FINDINGS: Lungs are hypoinflated with minimal left base opacification likely atelectasis. Cardiomediastinal silhouette and remainder of the exam is unchanged. IMPRESSION: Subtle left base opacification likely atelectasis. Electronically Signed   By: Marin Olp M.D.   On: 05/26/2015 14:09   I have personally reviewed and evaluated these images and lab results as part of my medical decision-making.  3:12 PM On repeat exam the patient remains in similar condition. She is mildly tachypneic, but in no distress. Discussed all findings the patient's family members.  Some concern for possible mild left basilar atelectasis versus aspiration but she remains afebrile, the patient will have antibiotics, short course..  Discussed this with the patient's family members   MDM  Patient presents after an episode of choking on food. Patient has baseline dementia, but per report, is interacting in a typical manner. Patient initially is tachypnea, some evidence for acute event, but improved notably following removal of the food bolus. Patient remained hemodynamically stable here, was discharged.   Carmin Muskrat, MD 05/26/15 [phone]

## 2015-05-26 NOTE — ED Notes (Signed)
From [employer] [employer] at [facility]: Per EMS arrived to find her choking and coughing in common lunch area. Fire attempted [employer] multiple times without success, EMS inserted an NPA and did deep suctioning which evacuated a large piece of ham from throat. After ham removed, patient went from being cyanotic and minimally responsive to pink and talking. Currently at baseline for mental status (dementia hx). Arrives on 15L [facility].

## 2016-05-18 ENCOUNTER — Emergency Department (HOSPITAL_COMMUNITY): Payer: Medicare Other

## 2016-05-18 ENCOUNTER — Emergency Department (HOSPITAL_COMMUNITY)
Admission: EM | Admit: 2016-05-18 | Discharge: 2016-05-18 | Disposition: A | Discharge status: Home / Self Care | Payer: Medicare Other | Attending: Emergency Medicine | Admitting: Emergency Medicine

## 2016-05-18 ENCOUNTER — Encounter (HOSPITAL_COMMUNITY): Payer: Self-pay | Admitting: Emergency Medicine

## 2016-05-18 DIAGNOSIS — H1130 Conjunctival hemorrhage, unspecified eye: Secondary | ICD-10-CM | POA: Diagnosis not present

## 2016-05-18 DIAGNOSIS — E039 Hypothyroidism, unspecified: Secondary | ICD-10-CM | POA: Diagnosis not present

## 2016-05-18 DIAGNOSIS — Z7982 Long term (current) use of aspirin: Secondary | ICD-10-CM | POA: Insufficient documentation

## 2016-05-18 DIAGNOSIS — Z79899 Other long term (current) drug therapy: Secondary | ICD-10-CM | POA: Diagnosis not present

## 2016-05-18 DIAGNOSIS — R0989 Other specified symptoms and signs involving the circulatory and respiratory systems: Secondary | ICD-10-CM | POA: Insufficient documentation

## 2016-05-18 DIAGNOSIS — R410 Disorientation, unspecified: Secondary | ICD-10-CM | POA: Diagnosis not present

## 2016-05-18 DIAGNOSIS — Z853 Personal history of malignant neoplasm of breast: Secondary | ICD-10-CM | POA: Insufficient documentation

## 2016-05-18 DIAGNOSIS — R609 Edema, unspecified: Secondary | ICD-10-CM | POA: Insufficient documentation

## 2016-05-18 DIAGNOSIS — T17308A Unspecified foreign body in larynx causing other injury, initial encounter: Secondary | ICD-10-CM

## 2016-05-18 DIAGNOSIS — R0602 Shortness of breath: Secondary | ICD-10-CM | POA: Diagnosis present

## 2016-05-18 LAB — CBC WITH DIFFERENTIAL/PLATELET
Basophils Absolute: 0 10*3/uL (ref 0.0–0.1)
Basophils Relative: 0 %
Eosinophils Absolute: 0.1 10*3/uL (ref 0.0–0.7)
Eosinophils Relative: 1 %
HEMATOCRIT: 36 % (ref 36.0–46.0)
Hemoglobin: 11.5 g/dL — ABNORMAL LOW (ref 12.0–15.0)
LYMPHS ABS: 1.8 10*3/uL (ref 0.7–4.0)
LYMPHS PCT: 14 %
MCH: 29.7 pg (ref 26.0–34.0)
MCHC: 31.9 g/dL (ref 30.0–36.0)
MCV: 93 fL (ref 78.0–100.0)
Monocytes Absolute: 0.5 10*3/uL (ref 0.1–1.0)
Monocytes Relative: 4 %
Neutro Abs: 10.6 10*3/uL — ABNORMAL HIGH (ref 1.7–7.7)
Neutrophils Relative %: 81 %
PLATELETS: 290 10*3/uL (ref 150–400)
RBC: 3.87 MIL/uL (ref 3.87–5.11)
RDW: 13.9 % (ref 11.5–15.5)
WBC: 13 10*3/uL — AB (ref 4.0–10.5)

## 2016-05-18 LAB — COMPREHENSIVE METABOLIC PANEL
ALBUMIN: 3.1 g/dL — AB (ref 3.5–5.0)
ALT: 14 U/L (ref 14–54)
AST: 23 U/L (ref 15–41)
Alkaline Phosphatase: 81 U/L (ref 38–126)
Anion gap: 10 (ref 5–15)
BUN: 17 mg/dL (ref 6–20)
CHLORIDE: 102 mmol/L (ref 101–111)
CO2: 27 mmol/L (ref 22–32)
Calcium: 8.6 mg/dL — ABNORMAL LOW (ref 8.9–10.3)
Creatinine, Ser: 1.18 mg/dL — ABNORMAL HIGH (ref 0.44–1.00)
GFR calc Af Amer: 46 mL/min — ABNORMAL LOW (ref >60)
GFR, EST NON AFRICAN AMERICAN: 40 mL/min — AB (ref >60)
GLUCOSE: 145 mg/dL — AB (ref 65–99)
Potassium: 4.1 mmol/L (ref 3.5–5.1)
Sodium: 139 mmol/L (ref 135–145)
Total Bilirubin: 0.1 mg/dL — ABNORMAL LOW (ref 0.3–1.2)
Total Protein: 5.7 g/dL — ABNORMAL LOW (ref 6.5–8.1)

## 2016-05-18 LAB — I-STAT CG4 LACTIC ACID, ED: Lactic Acid, Venous: 2.77 mmol/L (ref 0.5–1.9)

## 2016-05-18 LAB — BRAIN NATRIURETIC PEPTIDE: B NATRIURETIC PEPTIDE 5: 133.6 pg/mL — AB (ref 0.0–100.0)

## 2016-05-18 LAB — I-STAT TROPONIN, ED: Troponin i, poc: 0 ng/mL (ref 0.00–0.08)

## 2016-05-18 LAB — TROPONIN I: Troponin I: <0.03 ng/mL (ref <0.03)

## 2016-05-18 MED ORDER — SODIUM CHLORIDE 0.9 % IV BOLUS (SEPSIS)
1000.0000 mL | Freq: Once | INTRAVENOUS | Status: AC
  Administered 2016-05-18: 1000 mL via INTRAVENOUS

## 2016-05-18 NOTE — ED Notes (Signed)
Pt verbalized understanding discharge instructions and denies any further needs or questions at this time. VS stable 

## 2016-05-18 NOTE — ED Notes (Signed)
Addressed pt's elevated lactic with MD - MD states he does not think it is related to infection and is okay with sending patient home at this time. Pt is A&O per baseline according to family and VS are stable.

## 2016-05-18 NOTE — ED Notes (Signed)
Pt has subconjuntival hematoma in left eye that happened today during episode per daughter.

## 2016-05-18 NOTE — ED Provider Notes (Addendum)
[facility] DEPT Provider Note   CSN: [id number] Arrival date & time: 05/18/16  1348     History   Chief Complaint Chief Complaint  Patient presents with  . Shortness of Breath  . Altered Mental Status  . Hypotension    HPI Erika Whitney is a 82 y.o. female.  HPI  82 year old female presents after an episode of cyanosis at her living facility. Patient was seen in the living room and she was pale but awake and alert. She had an episode of lip cyanosis that lasted around 30 seconds per the facility. Was not seen coughing or choking. Has a history of choking frequently, most recently 4 months ago. This did occur just after lunch. Patient currently has no complaints. Daughter is at the bedside and states she is at her normal baseline except occasionally coughing has a new left subconjunctival hemorrhage. Patient has not been acting differently recently until today. She never passed out. Patient chronically has some dehydration and poor oral intake. No urinary symptoms. She has been coughing a little bit since this episode. When asked about leg edema, daughter states this is at baseline.  Past Medical History:  Diagnosis Date  . Arthritis   . Cancer (HCC)    BREAST  . Dementia   . Rectal prolapse     Patient Active Problem List   Diagnosis Date Noted  . Rectal prolapse 06/28/2013  . Vasovagal syncope 06/28/2013  . GI bleed 06/27/2013  . Rectal bleed 06/27/2013  . Dyslipidemia 06/27/2013  . Hypothyroidism 06/27/2013  . Anxiety and depression 06/27/2013  . Dementia 06/27/2013  . Neuropathic pain 06/27/2013    Past Surgical History:  Procedure Laterality Date  . ABDOMINAL HYSTERECTOMY    . APPENDECTOMY    . CESAREAN SECTION    . COLONOSCOPY N/A 06/28/2013   Procedure: COLONOSCOPY;  Surgeon: Beryle Beams, MD;  Location: [facility];  Service: Endoscopy;  Laterality: N/A;  . LAPAROSCOPIC SIGMOID COLECTOMY N/A 06/30/2013   Procedure: LAPAROSCOPIC RECTOPEXY WITH  SIGMOID COLECTOMY;  Surgeon: Leighton Ruff, MD;  Location: [facility];  Service: General;  Laterality: N/A;  . LEFT MASTECTOMY    . MASTECTOMY      OB History    No data available       Home Medications    Prior to Admission medications   Medication Sig Start Date End Date Taking? Authorizing Provider  aspirin EC 81 MG tablet Take 81 mg by mouth daily.    Historical Provider, MD  calcium-vitamin D (OSCAL WITH D) 500-200 MG-UNIT per tablet Take 2 tablets by mouth daily.    Historical Provider, MD  citalopram (CELEXA) 20 MG tablet Take 20 mg by mouth daily.    Historical Provider, MD  clonazePAM (KLONOPIN) 0.5 MG tablet Take 0.5 mg by mouth daily.    Historical Provider, MD  dextromethorphan-guaiFENesin (TUSSIN DM) 10-100 MG/5ML liquid Take 5 mLs by mouth every 6 (six) hours as needed. 05/26/15   Carmin Muskrat, MD  donepezil (ARICEPT) 10 MG tablet Take 10 mg by mouth at bedtime.    Historical Provider, MD  glucosamine-chondroitin 500-400 MG tablet Take 1 tablet by mouth 2 (two) times daily.    Historical Provider, MD  levothyroxine (SYNTHROID, LEVOTHROID) 75 MCG tablet Take 112.5 mcg by mouth daily before breakfast. Takes one and one half tablets to = 112.5mg     Historical Provider, MD  loratadine (CLARITIN) 10 MG tablet Take 10 mg by mouth daily as needed for allergies (allergies).    Historical Provider,  MD  memantine (NAMENDA) 10 MG tablet Take 10 mg by mouth 2 (two) times daily.    Historical Provider, MD  mirtazapine (REMERON) 15 MG tablet Take 15 mg by mouth at bedtime.    Historical Provider, MD  Multiple Vitamins-Minerals (MULTIVITAMIN PO) Take 1 tablet by mouth daily.    Historical Provider, MD  naproxen sodium (ANAPROX) 220 MG tablet Take 220 mg by mouth 2 (two) times daily as needed (pain).    Historical Provider, MD  Omega-3 Fatty Acids (FISH OIL) 1000 MG CAPS Take 1 capsule by mouth daily.    Historical Provider, MD  pregabalin (LYRICA) 100 MG capsule Take 100 mg by mouth daily.     Historical Provider, MD  simvastatin (ZOCOR) 10 MG tablet Take 10 mg by mouth daily.    Historical Provider, MD    Family History No family history on file.  Social History Social History  Substance Use Topics  . Smoking status: Never Smoker  . Smokeless tobacco: Never Used  . Alcohol use No     Allergies   Ibuprofen   Review of Systems Review of Systems  Unable to perform ROS: Dementia     Physical Exam Updated Vital Signs BP (!) 118/50 (BP Location: Right Arm)   Pulse 62   Temp 98.7 F (37.1 C) (Rectal)   Resp 20   Ht 5\' 2"  (1.575 m)   Wt 128 lb (58.1 kg)   SpO2 96%   BMI 23.41 kg/m   Physical Exam  Constitutional: She appears well-developed and well-nourished. No distress.  HENT:  Head: Normocephalic and atraumatic.  Right Ear: External ear normal.  Left Ear: External ear normal.  Nose: Nose normal.  Eyes: Right eye exhibits no discharge. Left eye exhibits no discharge.  Lateral subconjunctival hemorrhage  Cardiovascular: Normal rate, regular rhythm and normal heart sounds.   Pulmonary/Chest: Effort normal. She has rhonchi.  Mild rhonchi on expiration. Normal inspiration  Abdominal: Soft. There is no tenderness.  Musculoskeletal: She exhibits edema (mild pitting edema in bilateral lower extremities).  Neurological: She is alert. She is disoriented.  Skin: Skin is warm and dry. She is not diaphoretic.  Nursing note and vitals reviewed.    ED Treatments / Results  Labs (all labs ordered are listed, but only abnormal results are displayed) Labs Reviewed  COMPREHENSIVE METABOLIC PANEL - Abnormal; Notable for the following:       Result Value   Glucose, Bld 145 (*)    Creatinine, Ser 1.18 (*)    Calcium 8.6 (*)    Total Protein 5.7 (*)    Albumin 3.1 (*)    Total Bilirubin 0.1 (*)    GFR calc non Af Amer 40 (*)    GFR calc Af Amer 46 (*)    All other components within normal limits  CBC WITH DIFFERENTIAL/PLATELET - Abnormal; Notable for the  following:    WBC 13.0 (*)    Hemoglobin 11.5 (*)    Neutro Abs 10.6 (*)    All other components within normal limits  BRAIN NATRIURETIC PEPTIDE - Abnormal; Notable for the following:    B Natriuretic Peptide 133.6 (*)    All other components within normal limits  I-STAT CG4 LACTIC ACID, ED - Abnormal; Notable for the following:    Lactic Acid, Venous 2.77 (*)    All other components within normal limits  TROPONIN I  Randolm Idol, ED    EKG  EKG Interpretation  Date/Time:  Monday May 18 2016 13:56:06 EST  Ventricular Rate:  73 PR Interval:    QRS Duration: 89 QT Interval:  422 QTC Calculation: 465 R Axis:   -47 Text Interpretation:  Sinus rhythm Left anterior fascicular block Borderline T abnormalities, lateral leads nonspecific changes similar to 2015 Confirmed by Lelani Garnett MD, Kim [phone]) on 05/18/2016 2:50:39 PM       Radiology Dg Chest 2 View  Result Date: 05/18/2016 CLINICAL DATA:  Unresponsive and hypotensive EXAM: CHEST  2 VIEW COMPARISON:  05/26/2015 FINDINGS: Cardiac shadow is at the upper limits of normal in size. The lungs are well aerated bilaterally. No focal infiltrate or sizable effusion is seen. No acute bony abnormality is noted. IMPRESSION: No active cardiopulmonary disease. Electronically Signed   By: Inez Catalina M.D.   On: 05/18/2016 15:03    Procedures Procedures (including critical care time)  Medications Ordered in ED Medications  sodium chloride 0.9 % bolus 1,000 mL (1,000 mLs Intravenous New Bag/Given 05/18/16 1505)     Initial Impression / Assessment and Plan / ED Course  I have reviewed the triage vital signs and the nursing notes.  Pertinent labs & imaging results that were available during my care of the patient were reviewed by me and considered in my medical decision making (see chart for details).  Clinical Course     Patient overall appears well and is at her baseline. A little bit of a cough. Oxygen saturation  saturations and work of breathing are normal. Given her transient cyanosis and new subconjunctival hemorrhage, most likely had a choking episode that was not fully witnessed. This likely caused her white blood cell count elevation as part of a stress response. Creatinine is mildly up, discusses with family who would rather not keep the patient in the hospital because of her dementia. They would rather take her home after IV fluids in the ED. Unclear if her lactate is from this as there are no signs of infection including no tachycardia or fever. I don't think this represents sepsis or acute organ failure. Discussed that since there is no obvious infection, no antibodies warranted at this time, strict return per cautions were discussed. I highly doubt a syncopal or near syncopal episode.  Final Clinical Impressions(s) / ED Diagnoses   Final diagnoses:  Choking, initial encounter    New Prescriptions New Prescriptions   No medications on file     Sherwood Gambler, MD 05/18/16 [place], MD 05/18/16 1620

## 2016-05-18 NOTE — ED Triage Notes (Addendum)
Pt to ED via [facility] from [facility] on [place] with c/o cyanosis and unresponsiveness, when EMS arrived, pt was hypotensive-- BP 86/54. Pt received NS 500cc-- IV-- per EMS.   Pt has hx of Alzheimers, is in the Alzheimer's Unit at nursing home.   Pt has a hx of choking on food-- this episode happened after lunch -- not sure if she had saved food in cheek and choked on that-- staff did not have to perform Heimlich, no food found in mouth.

## 2017-01-24 ENCOUNTER — Encounter (HOSPITAL_COMMUNITY): Payer: Self-pay | Admitting: Emergency Medicine

## 2017-01-24 ENCOUNTER — Emergency Department (HOSPITAL_COMMUNITY)
Admission: EM | Admit: 2017-01-24 | Discharge: 2017-01-25 | Disposition: A | Discharge status: Home / Self Care | Payer: Medicare Other | Attending: Emergency Medicine | Admitting: Emergency Medicine

## 2017-01-24 ENCOUNTER — Emergency Department (HOSPITAL_COMMUNITY): Payer: Medicare Other

## 2017-01-24 DIAGNOSIS — Y999 Unspecified external cause status: Secondary | ICD-10-CM | POA: Insufficient documentation

## 2017-01-24 DIAGNOSIS — S59901A Unspecified injury of right elbow, initial encounter: Secondary | ICD-10-CM | POA: Diagnosis present

## 2017-01-24 DIAGNOSIS — Z23 Encounter for immunization: Secondary | ICD-10-CM | POA: Insufficient documentation

## 2017-01-24 DIAGNOSIS — W19XXXA Unspecified fall, initial encounter: Secondary | ICD-10-CM | POA: Insufficient documentation

## 2017-01-24 DIAGNOSIS — Y92129 Unspecified place in nursing home as the place of occurrence of the external cause: Secondary | ICD-10-CM | POA: Insufficient documentation

## 2017-01-24 DIAGNOSIS — S51011A Laceration without foreign body of right elbow, initial encounter: Secondary | ICD-10-CM | POA: Diagnosis not present

## 2017-01-24 DIAGNOSIS — F0390 Unspecified dementia without behavioral disturbance: Secondary | ICD-10-CM | POA: Diagnosis not present

## 2017-01-24 DIAGNOSIS — E039 Hypothyroidism, unspecified: Secondary | ICD-10-CM | POA: Diagnosis not present

## 2017-01-24 DIAGNOSIS — Y939 Activity, unspecified: Secondary | ICD-10-CM | POA: Insufficient documentation

## 2017-01-24 DIAGNOSIS — Z7982 Long term (current) use of aspirin: Secondary | ICD-10-CM | POA: Insufficient documentation

## 2017-01-24 DIAGNOSIS — S5001XA Contusion of right elbow, initial encounter: Secondary | ICD-10-CM | POA: Diagnosis not present

## 2017-01-24 DIAGNOSIS — Z79899 Other long term (current) drug therapy: Secondary | ICD-10-CM | POA: Diagnosis not present

## 2017-01-24 DIAGNOSIS — Z853 Personal history of malignant neoplasm of breast: Secondary | ICD-10-CM | POA: Insufficient documentation

## 2017-01-24 NOTE — ED Notes (Signed)
Bed: HQ01 Expected date:  Expected time:  Means of arrival:  Comments: 84f elbow dislocated

## 2017-01-24 NOTE — ED Triage Notes (Signed)
Brought in by EMS from [facility] with c/o right elbow injury after her unwitnessed fall tonight.  Pt was found crawling on the floor and out of her room with obvious injury to her right elbow---- pain and swelling and deformity noted by EMS.  Pt was given a total of Fentanyl 75 mcg IV (was given increments of 25 mcg).   Pt has Alzheimer's, and is on her norm per SNF staff.  Pt's O2 sat dropped to 80% on first dose of Fentanyl 25 mcg---- was placed on O2 at 4 LPM via [place].

## 2017-01-24 NOTE — ED Provider Notes (Signed)
[place] DEPT Provider Note   CSN: [id number] Arrival date & time: 01/24/17  2321 By signing my name below, I, Georgette Shell, attest that this documentation has been prepared under the direction and in the presence of Rolland Porter, MD. Electronically Signed: Georgette Shell, ED Scribe. 01/25/17. 12:07 AM.  Time seen: 00:07   History   Chief Complaint Chief Complaint  Patient presents with  . Fall  . Elbow Injury   LEVEL V CAVEAT: HPI and ROS limited due to dementia.  HPI The history is provided by the patient, the nursing home and the EMS personnel. The history is limited by the condition of the patient. No language interpreter was used.   HPI Comments: Erika Whitney is a 83 y.o. female with h/o breast cancer and dementia, who presents to the Emergency Department by EMS from [facility] for evaluation following unwitnessed mechanical fall. Per EMS, pt was found crawling on the floor and out of her room with obvious injury to her right elbow with swelling. Pt was given Fentanyl 75 mcg IV. Pt has h/o dementia and is at baseline per staff. During interview, pt states she cannot recall what happened prior to arriving to the ED. No known fevers or additional injuries. She denies having any pain.   PCP: Stephens Shire, MD  Past Medical History:  Diagnosis Date  . Arthritis   . Cancer (HCC)    BREAST  . Dementia   . Rectal prolapse     Patient Active Problem List   Diagnosis Date Noted  . Rectal prolapse 06/28/2013  . Vasovagal syncope 06/28/2013  . GI bleed 06/27/2013  . Rectal bleed 06/27/2013  . Dyslipidemia 06/27/2013  . Hypothyroidism 06/27/2013  . Anxiety and depression 06/27/2013  . Dementia 06/27/2013  . Neuropathic pain 06/27/2013    Past Surgical History:  Procedure Laterality Date  . ABDOMINAL HYSTERECTOMY    . APPENDECTOMY    . CESAREAN SECTION    . COLONOSCOPY N/A 06/28/2013   Procedure: COLONOSCOPY;  Surgeon: Beryle Beams, MD;  Location: [facility]  ENDOSCOPY;  Service: Endoscopy;  Laterality: N/A;  . LAPAROSCOPIC SIGMOID COLECTOMY N/A 06/30/2013   Procedure: LAPAROSCOPIC RECTOPEXY WITH SIGMOID COLECTOMY;  Surgeon: Leighton Ruff, MD;  Location: [facility] ORS;  Service: General;  Laterality: N/A;  . LEFT MASTECTOMY    . MASTECTOMY      OB History    No data available       Home Medications    Prior to Admission medications   Medication Sig Start Date End Date Taking? Authorizing Provider  acetaminophen (TYLENOL) 500 MG tablet Take 500 mg by mouth every 4 (four) hours as needed for headache.   Yes [provider]  alum & mag hydroxide-simeth ([place]) 200-200-20 MG/5ML suspension Take 30 mLs by mouth every 6 (six) hours as needed for indigestion or heartburn.   Yes [provider]  aspirin EC 81 MG tablet Take 81 mg by mouth daily.   Yes [provider]  clonazePAM (KLONOPIN) 0.5 MG tablet Take 0.5 mg by mouth daily.   Yes [provider]  clonazePAM (KLONOPIN) 1 MG tablet Take 1 mg by mouth at bedtime.   Yes [provider]  Dextromethorphan-Quinidine (NUEDEXTA) 20-10 MG CAPS Take 1 capsule by mouth every 12 (twelve) hours.   Yes [provider]  divalproex (DEPAKOTE SPRINKLE) 125 MG capsule Take 125 mg by mouth daily.   Yes [provider]  donepezil (ARICEPT) 10 MG tablet Take 10 mg by mouth  2 (two) times daily.    Yes [provider]  guaifenesin (ROBITUSSIN) 100 MG/5ML syrup Take 200 mg by mouth 4 (four) times daily as needed for cough or congestion.   Yes [provider]  hydrochlorothiazide (HYDRODIURIL) 12.5 MG tablet Take 25 mg by mouth daily.   Yes [provider]  hydrOXYzine (ATARAX/VISTARIL) 25 MG tablet Take 50 mg by mouth 3 (three) times daily as needed.   Yes [provider]  levothyroxine (SYNTHROID, LEVOTHROID) 100 MCG tablet Take 100 mcg by mouth daily before breakfast.   Yes [provider]  loperamide (IMODIUM A-D) 2  MG tablet Take 2 mg by mouth 4 (four) times daily as needed for diarrhea or loose stools.   Yes [provider]  magnesium hydroxide (MILK OF MAGNESIA) 400 MG/5ML suspension Take 30 mLs by mouth at bedtime as needed for mild constipation.   Yes [provider]  MELATONIN PO Take 1 capsule by mouth at bedtime.   Yes [provider]  memantine (NAMENDA XR) 28 MG CP24 24 hr capsule Take 28 mg by mouth daily.   Yes [provider]  mirtazapine (REMERON) 30 MG tablet Take 30 mg by mouth at bedtime.   Yes [provider]  Multiple Vitamins-Minerals (MULTIVITAMIN PO) Take 1 tablet by mouth daily.   Yes [provider]  QUEtiapine (SEROQUEL) 25 MG tablet Take 50 mg by mouth 2 (two) times daily.   Yes [provider]  simvastatin (ZOCOR) 10 MG tablet Take 10 mg by mouth at bedtime.    Yes [provider]  dextromethorphan-guaiFENesin (TUSSIN DM) 10-100 MG/5ML liquid Take 5 mLs by mouth every 6 (six) hours as needed. Patient not taking: Reported on 01/24/2017 05/26/15   Carmin Muskrat, MD    Family History History reviewed. No pertinent family history.  Social History Social History  Substance Use Topics  . Smoking status: Never Smoker  . Smokeless tobacco: Never Used  . Alcohol use No  lives in nursing home   Allergies   Ibuprofen   Review of Systems Review of Systems  Unable to perform ROS: Dementia   Physical Exam Updated Vital Signs BP 138/64 (BP Location: Left Arm)   Pulse 65   Temp 97.9 F (36.6 C) (Oral)   Resp 18   SpO2 97%   Vital signs normal    Physical Exam  Constitutional:  Non-toxic appearance. She does not appear ill. No distress.  Elderly frail female in no distress.  HENT:  Head: Normocephalic and atraumatic.  Right Ear: External ear normal.  Left Ear: External ear normal.  Nose: Nose normal. No mucosal edema or rhinorrhea.  Mouth/Throat: Oropharynx is clear and moist and mucous  membranes are normal. No dental abscesses or uvula swelling.  Eyes: Pupils are equal, round, and reactive to light. Conjunctivae and EOM are normal.  Neck: Full passive range of motion without pain.  Patient is in a c-collar  Cardiovascular: Normal rate, regular rhythm and normal heart sounds.  Exam reveals no gallop and no friction rub.   No murmur heard. Pulmonary/Chest: Effort normal and breath sounds normal. No respiratory distress. She has no wheezes. She has no rhonchi. She has no rales. She exhibits no tenderness and no crepitus.  Abdominal: Soft. Normal appearance and bowel sounds are normal. She exhibits no distension. There is no tenderness. There is no rebound and no guarding.  Musculoskeletal: She exhibits edema and tenderness.  Less than a dime size skin tear on her right elbow  with swelling around the right elbow, seems painful; good distal pulses. Moves all other extremities without pain or deformity.  Neurological: She is alert. She has normal strength. No cranial nerve deficit.  Skin: Skin is warm, dry and intact. No rash noted. No erythema. No pallor.  Psychiatric: She has a normal mood and affect. Her speech is normal and behavior is normal. Her mood appears not anxious.  Nursing note and vitals reviewed.  ED Treatments / Results  Labs (all labs ordered are listed, but only abnormal results are displayed) Labs Reviewed - No data to display  EKG  EKG Interpretation None       Radiology Dg Elbow Complete Right  Result Date: 01/25/2017 CLINICAL DATA:  Right elbow injury after unwitnessed fall. EXAM: RIGHT ELBOW - COMPLETE 3+ VIEW COMPARISON:  None. FINDINGS: Diffuse bone demineralization. No acute fracture or dislocation is identified. No significant effusion. No focal bone lesion or bone destruction. Soft tissue swelling over the posterior olecranon likely represents a hematoma. IMPRESSION: Soft tissue hematoma posterior to the olecranon. No evidence of acute fracture  or dislocation. Electronically Signed   By: Lucienne Capers M.D.   On: 01/25/2017 00:36   Ct Head Wo Contrast  Ct Cervical Spine Wo Contrast  Result Date: 01/25/2017 CLINICAL DATA:  83 year old female with unwitnessed fall. EXAM: CT HEAD WITHOUT CONTRAST CT CERVICAL SPINE WITHOUT CONTRAST TECHNIQUE: Multidetector CT imaging of the head and cervical spine was performed following the standard protocol without intravenous contrast. Multiplanar CT image reconstructions of the cervical spine were also generated. COMPARISON:  None. FINDINGS: CT HEAD FINDINGS Brain: There is moderate age-related atrophy and chronic microvascular ischemic changes. There is no acute intracranial hemorrhage. No mass effect or midline shift. No extra-axial fluid collection. Vascular: No hyperdense vessel or unexpected calcification. Skull: Normal. Negative for fracture or focal lesion. Sinuses/Orbits: The visualized paranasal sinuses are clear. There is opacification of multiple mastoid air cells bilaterally. Clinical correlation is recommended. Other: None CT CERVICAL SPINE FINDINGS Alignment: No acute subluxation. Skull base and vertebrae: Osteopenia.  No acute fracture. Soft tissues and spinal canal: No prevertebral fluid or swelling. No visible canal hematoma. Disc levels: Degenerative changes and endplate irregularity and disc space narrowing primarily at C3-C4 and C4-C5. Upper chest: Negative. Other: There are atherosclerotic calcification of the aortic arch. IMPRESSION: 1. No acute intracranial hemorrhage. Age-related atrophy and chronic microvascular ischemic changes. 2. No acute/traumatic cervical spine pathology. Degenerative changes. Electronically Signed   By: Anner Crete M.D.   On: 01/25/2017 01:18    Procedures Procedures (including critical care time)  Medications Ordered in ED Medications  Tdap (BOOSTRIX) injection 0.5 mL (0.5 mLs Intramuscular Given 01/25/17 0057)     Initial Impression / Assessment and  Plan / ED Course  I have reviewed the triage vital signs and the nursing notes.  Pertinent labs & imaging results that were available during my care of the patient were reviewed by me and considered in my medical decision making (see chart for details).  DIAGNOSTIC STUDIES: Oxygen Saturation is 97% on [place] 4L, adequate by my interpretation.   COORDINATION OF CARE: 12:06 AM-Xrays had been ordered of her right elbow. CT of head and cervical spine were ordered.   1:52 AM-Rechecked RUE. Pt has good ROM of right shoulder with no deformity.Pt was sleeping. Her C collar was removed by me.   Final Clinical Impressions(s) / ED Diagnoses   Final diagnoses:  Contusion of right elbow, initial encounter  Skin tear of right elbow without  complication, initial encounter     New Prescriptions Acetaminophen  Plan discharge  Rolland Porter, MD, FACEP   I personally performed the services described in this documentation, which was scribed in my presence. The recorded information has been reviewed and considered.     Rolland Porter, MD 01/25/17 [phone]

## 2017-01-25 ENCOUNTER — Emergency Department (HOSPITAL_COMMUNITY): Payer: Medicare Other

## 2017-01-25 MED ORDER — TETANUS-DIPHTH-ACELL PERTUSSIS 5-2.5-18.5 LF-MCG/0.5 IM SUSP
0.5000 mL | Freq: Once | INTRAMUSCULAR | Status: AC
  Administered 2017-01-25: 0.5 mL via INTRAMUSCULAR
  Filled 2017-01-25: qty 0.5

## 2017-01-25 NOTE — ED Notes (Signed)
[facility] was called for pt's transportation back to [facility].

## 2017-01-25 NOTE — Discharge Instructions (Signed)
Use ice packs on the swollen area. Keep the skin tear clean and dry.  Recheck if the area gets red, warm, she gets a fever. She can have acetaminophen for pain if needed.

## 2017-01-25 NOTE — ED Notes (Signed)
Staff at [facility] was called and given report on pt's condition and discharge; discharge instructions for wound care and follow-up with her PCP also provided.

## 2017-01-25 NOTE — ED Notes (Signed)
[facility] here to transport pt back to [facility].

## 2017-02-14 ENCOUNTER — Encounter (HOSPITAL_COMMUNITY): Payer: Self-pay | Admitting: Nurse Practitioner

## 2017-02-14 ENCOUNTER — Emergency Department (HOSPITAL_COMMUNITY)
Admission: EM | Admit: 2017-02-14 | Discharge: 2017-02-15 | Disposition: A | Discharge status: Home / Self Care | Payer: Medicare Other | Attending: Emergency Medicine | Admitting: Emergency Medicine

## 2017-02-14 DIAGNOSIS — E039 Hypothyroidism, unspecified: Secondary | ICD-10-CM | POA: Insufficient documentation

## 2017-02-14 DIAGNOSIS — R4182 Altered mental status, unspecified: Secondary | ICD-10-CM | POA: Diagnosis not present

## 2017-02-14 DIAGNOSIS — Z853 Personal history of malignant neoplasm of breast: Secondary | ICD-10-CM | POA: Diagnosis not present

## 2017-02-14 DIAGNOSIS — Y999 Unspecified external cause status: Secondary | ICD-10-CM | POA: Diagnosis not present

## 2017-02-14 DIAGNOSIS — Y939 Activity, unspecified: Secondary | ICD-10-CM | POA: Insufficient documentation

## 2017-02-14 DIAGNOSIS — F0390 Unspecified dementia without behavioral disturbance: Secondary | ICD-10-CM | POA: Insufficient documentation

## 2017-02-14 DIAGNOSIS — Z7982 Long term (current) use of aspirin: Secondary | ICD-10-CM | POA: Insufficient documentation

## 2017-02-14 DIAGNOSIS — W19XXXA Unspecified fall, initial encounter: Secondary | ICD-10-CM | POA: Diagnosis not present

## 2017-02-14 DIAGNOSIS — S0101XA Laceration without foreign body of scalp, initial encounter: Secondary | ICD-10-CM | POA: Insufficient documentation

## 2017-02-14 DIAGNOSIS — S0990XA Unspecified injury of head, initial encounter: Secondary | ICD-10-CM | POA: Diagnosis present

## 2017-02-14 DIAGNOSIS — Y92129 Unspecified place in nursing home as the place of occurrence of the external cause: Secondary | ICD-10-CM | POA: Diagnosis not present

## 2017-02-14 DIAGNOSIS — Z79899 Other long term (current) drug therapy: Secondary | ICD-10-CM | POA: Diagnosis not present

## 2017-02-14 NOTE — ED Notes (Signed)
Bed: [facility]85 Expected date:  Expected time:  Means of arrival:  Comments: 48f fall with lac to head

## 2017-02-14 NOTE — ED Triage Notes (Signed)
Pt is brought in from [facility] for evaluation post an unwitnessed fall with a small none bleeding lac on right temporal-occipital aspect of her head. Facility MAR indicates pt is not on anticoagulants.

## 2017-02-15 ENCOUNTER — Emergency Department (HOSPITAL_COMMUNITY): Payer: Medicare Other

## 2017-02-15 NOTE — ED Provider Notes (Signed)
[place] DEPT Provider Note   CSN: [id number] Arrival date & time: 02/14/17  2242     History   Chief Complaint Chief Complaint  Patient presents with  . Fall  . Head Laceration    HPI Erika Whitney is a 83 y.o. female who presents with an unwitnessed fall and scalp laceration. Past medical history significant for advanced dementia, history of breast cancer, arthritis. She is from [place] and was found on the floor this evening by staff. She is alert and responds to some questioning but is too disoriented to give any adequate history. She denies pain. She is not on any blood thinners.  Level V caveat due to dementia.  HPI  Past Medical History:  Diagnosis Date  . Arthritis   . Cancer (HCC)    BREAST  . Dementia   . Rectal prolapse     Patient Active Problem List   Diagnosis Date Noted  . Rectal prolapse 06/28/2013  . Vasovagal syncope 06/28/2013  . GI bleed 06/27/2013  . Rectal bleed 06/27/2013  . Dyslipidemia 06/27/2013  . Hypothyroidism 06/27/2013  . Anxiety and depression 06/27/2013  . Dementia 06/27/2013  . Neuropathic pain 06/27/2013    Past Surgical History:  Procedure Laterality Date  . ABDOMINAL HYSTERECTOMY    . APPENDECTOMY    . CESAREAN SECTION    . COLONOSCOPY N/A 06/28/2013   Procedure: COLONOSCOPY;  Surgeon: Beryle Beams, MD;  Location: [facility] ENDOSCOPY;  Service: Endoscopy;  Laterality: N/A;  . LAPAROSCOPIC SIGMOID COLECTOMY N/A 06/30/2013   Procedure: LAPAROSCOPIC RECTOPEXY WITH SIGMOID COLECTOMY;  Surgeon: Leighton Ruff, MD;  Location: [facility] ORS;  Service: General;  Laterality: N/A;  . LEFT MASTECTOMY    . MASTECTOMY      OB History    No data available       Home Medications    Prior to Admission medications   Medication Sig Start Date End Date Taking? Authorizing Provider  acetaminophen (TYLENOL) 500 MG tablet Take 500 mg by mouth every 4 (four) hours as needed for headache.    [provider]  alum & mag  hydroxide-simeth ([place]) 200-200-20 MG/5ML suspension Take 30 mLs by mouth every 6 (six) hours as needed for indigestion or heartburn.    [provider]  aspirin EC 81 MG tablet Take 81 mg by mouth daily.    [provider]  clonazePAM (KLONOPIN) 0.5 MG tablet Take 0.5 mg by mouth daily.    [provider]  clonazePAM (KLONOPIN) 1 MG tablet Take 1 mg by mouth at bedtime.    [provider]  dextromethorphan-guaiFENesin (TUSSIN DM) 10-100 MG/5ML liquid Take 5 mLs by mouth every 6 (six) hours as needed. Patient not taking: Reported on 01/24/2017 05/26/15   Carmin Muskrat, MD  Dextromethorphan-Quinidine (NUEDEXTA) 20-10 MG CAPS Take 1 capsule by mouth every 12 (twelve) hours.    [provider]  divalproex (DEPAKOTE SPRINKLE) 125 MG capsule Take 125 mg by mouth daily.    [provider]  donepezil (ARICEPT) 10 MG tablet Take 10 mg by mouth 2 (two) times daily.     [provider]  guaifenesin (ROBITUSSIN) 100 MG/5ML syrup Take 200 mg by mouth 4 (four) times daily as needed for cough or congestion.    [provider]  hydrochlorothiazide (HYDRODIURIL) 12.5 MG tablet Take 25 mg by mouth daily.    [provider]  hydrOXYzine (ATARAX/VISTARIL) 25 MG tablet Take 50 mg by mouth 3 (three) times daily as needed.  [provider]  levothyroxine (SYNTHROID, LEVOTHROID) 100 MCG tablet Take 100 mcg by mouth daily before breakfast.    [provider]  loperamide (IMODIUM A-D) 2 MG tablet Take 2 mg by mouth 4 (four) times daily as needed for diarrhea or loose stools.    [provider]  magnesium hydroxide (MILK OF MAGNESIA) 400 MG/5ML suspension Take 30 mLs by mouth at bedtime as needed for mild constipation.    [provider]  MELATONIN PO Take 1 capsule by mouth at bedtime.    [provider]  memantine (NAMENDA XR) 28 MG CP24 24 hr capsule Take 28 mg by mouth daily.    [provider]  mirtazapine (REMERON) 30 MG tablet Take 30 mg by mouth at bedtime.    [provider]  Multiple Vitamins-Minerals (MULTIVITAMIN PO) Take 1 tablet by mouth daily.    [provider]  QUEtiapine (SEROQUEL) 25 MG tablet Take 50 mg by mouth 2 (two) times daily.    [provider]  simvastatin (ZOCOR) 10 MG tablet Take 10 mg by mouth at bedtime.     [provider]    Family History History reviewed. No pertinent family history.  Social History Social History  Substance Use Topics  . Smoking status: Never Smoker  . Smokeless tobacco: Never Used  . Alcohol use No     Allergies   Ibuprofen   Review of Systems Review of Systems  Unable to perform ROS: Dementia     Physical Exam Updated Vital Signs BP 137/61 (BP Location: Right Arm)   Pulse 68   Temp 98 F (36.7 C) (Oral)   Resp 14   SpO2 95%   Physical Exam  Constitutional: She appears well-developed and well-nourished. No distress.  Elderly female in no acute distress  HENT:  Head: Normocephalic.  Small oozing wound over right temporal area  Eyes: Pupils are equal, round, and reactive to light. Conjunctivae are normal. Right eye exhibits no discharge. Left eye exhibits no discharge. No scleral icterus.  Neck: Normal range of motion.  No midline tenderness  Cardiovascular: Normal rate and regular rhythm.  Exam reveals no gallop and no friction rub.   No murmur heard. Pulmonary/Chest: Effort normal and breath sounds normal. No respiratory distress. She has no wheezes. She has no rales. She exhibits no tenderness.  Abdominal: Soft. Bowel sounds are normal. She exhibits no distension and no mass. There is no tenderness. There is no rebound and no guarding. No hernia.  Musculoskeletal:  Bilateral upper extremities: Shoulder, elbow, wrist are nontender and she has full range of motion of shoulder, elbow, wrist  Bilateral lower extremities: Hip and knee are non-tender with  FROM     Neurological: She is alert. She is disoriented. GCS motor subscore is 5.  Skin: Skin is warm and dry.  Psychiatric: She has a normal mood and affect. Her behavior is normal.  Nursing note and vitals reviewed.    ED Treatments / Results  Labs (all labs ordered are listed, but only abnormal results are displayed) Labs Reviewed - No data to display  EKG  EKG Interpretation None       Radiology Ct Head Wo Contrast  Result Date: 02/15/2017 CLINICAL DATA:  Unwitnessed fall. Laceration of right temporoparietal with scalp. History of breast cancer with mastectomy on the left. Dementia. EXAM: CT HEAD WITHOUT CONTRAST CT CERVICAL SPINE WITHOUT CONTRAST TECHNIQUE: Multidetector CT imaging of the head and cervical spine was performed following the standard protocol  without intravenous contrast. Multiplanar CT image reconstructions of the cervical spine were also generated. COMPARISON:  01/25/2017 FINDINGS: CT HEAD FINDINGS Brain: Chronic superficial and central atrophy with small vessel ischemic disease. Idiopathic basal ganglial calcifications are seen bilaterally. No acute intracranial hemorrhage, midline shift or edema. No extra-axial fluid collections. No extra-axial mass. Vascular: No hyperdense vessel or unexpected calcification. Skull: Normal. Negative for fracture or focal lesion. Sinuses/Orbits: No acute finding. Bilateral lens replacements surgeries. Other: Chronic small mastoid effusions bilaterally. No significant scalp soft tissue swelling. The reported right temporo occipital laceration is not apparent however some minimal scalp debris along the posterior right parietal scalp. CT CERVICAL SPINE FINDINGS Alignment: No acute posttraumatic subluxation. Skull base and vertebrae: The bones are demineralized without acute fracture or suspicious osseous lesions. Soft tissues and spinal canal: No prevertebral fluid or swelling. No visible canal hematoma. Disc levels: Degenerative disc  disease C3 through C5 with small posterior disc - osteophyte complexes similar to prior exam. Upper chest: No acute abnormality of the visualized lung apices. Other: None IMPRESSION: 1. Chronic age related cerebral atrophy and small vessel ischemic disease. No acute intracranial abnormality. 2. Chronic degenerative disc disease C3 through C5 with small disc-osteophyte complexes. No acute cervical spine fracture or subluxations. Electronically Signed   By: Ashley Royalty M.D.   On: 02/15/2017 00:45   Ct Cervical Spine Wo Contrast  Result Date: 02/15/2017 CLINICAL DATA:  Unwitnessed fall. Laceration of right temporoparietal with scalp. History of breast cancer with mastectomy on the left. Dementia. EXAM: CT HEAD WITHOUT CONTRAST CT CERVICAL SPINE WITHOUT CONTRAST TECHNIQUE: Multidetector CT imaging of the head and cervical spine was performed following the standard protocol without intravenous contrast. Multiplanar CT image reconstructions of the cervical spine were also generated. COMPARISON:  01/25/2017 FINDINGS: CT HEAD FINDINGS Brain: Chronic superficial and central atrophy with small vessel ischemic disease. Idiopathic basal ganglial calcifications are seen bilaterally. No acute intracranial hemorrhage, midline shift or edema. No extra-axial fluid collections. No extra-axial mass. Vascular: No hyperdense vessel or unexpected calcification. Skull: Normal. Negative for fracture or focal lesion. Sinuses/Orbits: No acute finding. Bilateral lens replacements surgeries. Other: Chronic small mastoid effusions bilaterally. No significant scalp soft tissue swelling. The reported right temporo occipital laceration is not apparent however some minimal scalp debris along the posterior right parietal scalp. CT CERVICAL SPINE FINDINGS Alignment: No acute posttraumatic subluxation. Skull base and vertebrae: The bones are demineralized without acute fracture or suspicious osseous lesions. Soft tissues and spinal canal: No  prevertebral fluid or swelling. No visible canal hematoma. Disc levels: Degenerative disc disease C3 through C5 with small posterior disc - osteophyte complexes similar to prior exam. Upper chest: No acute abnormality of the visualized lung apices. Other: None IMPRESSION: 1. Chronic age related cerebral atrophy and small vessel ischemic disease. No acute intracranial abnormality. 2. Chronic degenerative disc disease C3 through C5 with small disc-osteophyte complexes. No acute cervical spine fracture or subluxations. Electronically Signed   By: Ashley Royalty M.D.   On: 02/15/2017 00:45    Procedures Procedures (including critical care time)  Medications Ordered in ED Medications - No data to display   Initial Impression / Assessment and Plan / ED Course  I have reviewed the triage vital signs and the nursing notes.  Pertinent labs & imaging results that were available during my care of the patient were reviewed by me and considered in my medical decision making (see chart for details).  83 year old female with unwitnessed fall and head injury. Head and neck CT  are negative. Scalp laceration is very small. Dermabond was applied. No evidence of any other injuries. Will discharge back to SNF.  Final Clinical Impressions(s) / ED Diagnoses   Final diagnoses:  Injury of head, initial encounter  Laceration of scalp, initial encounter    New Prescriptions New Prescriptions   No medications on file     Iris Pert 02/15/17 0127    Palumbo, April, MD 02/15/17 [phone]

## 2017-02-15 NOTE — ED Notes (Signed)
PTAR requested 

## 2017-02-24 ENCOUNTER — Emergency Department (HOSPITAL_COMMUNITY): Payer: Medicare Other

## 2017-02-24 ENCOUNTER — Emergency Department (HOSPITAL_COMMUNITY)
Admission: EM | Admit: 2017-02-24 | Discharge: 2017-02-24 | Disposition: A | Discharge status: Home / Self Care | Payer: Medicare Other | Attending: Emergency Medicine | Admitting: Emergency Medicine

## 2017-02-24 DIAGNOSIS — F329 Major depressive disorder, single episode, unspecified: Secondary | ICD-10-CM

## 2017-02-24 DIAGNOSIS — S065X9A Traumatic subdural hemorrhage with loss of consciousness of unspecified duration, initial encounter: Secondary | ICD-10-CM

## 2017-02-24 DIAGNOSIS — Z79899 Other long term (current) drug therapy: Secondary | ICD-10-CM | POA: Insufficient documentation

## 2017-02-24 DIAGNOSIS — F0390 Unspecified dementia without behavioral disturbance: Secondary | ICD-10-CM

## 2017-02-24 DIAGNOSIS — S0990XA Unspecified injury of head, initial encounter: Secondary | ICD-10-CM | POA: Diagnosis present

## 2017-02-24 DIAGNOSIS — Y9389 Activity, other specified: Secondary | ICD-10-CM | POA: Insufficient documentation

## 2017-02-24 DIAGNOSIS — S0191XA Laceration without foreign body of unspecified part of head, initial encounter: Secondary | ICD-10-CM | POA: Diagnosis not present

## 2017-02-24 DIAGNOSIS — W01198A Fall on same level from slipping, tripping and stumbling with subsequent striking against other object, initial encounter: Secondary | ICD-10-CM | POA: Insufficient documentation

## 2017-02-24 DIAGNOSIS — I6200 Nontraumatic subdural hemorrhage, unspecified: Secondary | ICD-10-CM | POA: Diagnosis not present

## 2017-02-24 DIAGNOSIS — F419 Anxiety disorder, unspecified: Secondary | ICD-10-CM | POA: Diagnosis not present

## 2017-02-24 DIAGNOSIS — S065XAA Traumatic subdural hemorrhage with loss of consciousness status unknown, initial encounter: Secondary | ICD-10-CM

## 2017-02-24 DIAGNOSIS — Z7982 Long term (current) use of aspirin: Secondary | ICD-10-CM | POA: Insufficient documentation

## 2017-02-24 DIAGNOSIS — F32A Depression, unspecified: Secondary | ICD-10-CM | POA: Diagnosis present

## 2017-02-24 DIAGNOSIS — E039 Hypothyroidism, unspecified: Secondary | ICD-10-CM | POA: Diagnosis not present

## 2017-02-24 DIAGNOSIS — I10 Essential (primary) hypertension: Secondary | ICD-10-CM

## 2017-02-24 DIAGNOSIS — Y929 Unspecified place or not applicable: Secondary | ICD-10-CM | POA: Diagnosis not present

## 2017-02-24 DIAGNOSIS — M792 Neuralgia and neuritis, unspecified: Secondary | ICD-10-CM | POA: Diagnosis not present

## 2017-02-24 DIAGNOSIS — Y999 Unspecified external cause status: Secondary | ICD-10-CM | POA: Diagnosis not present

## 2017-02-24 LAB — CBC WITH DIFFERENTIAL/PLATELET
Basophils Absolute: 0 10*3/uL (ref 0.0–0.1)
Basophils Relative: 0 %
Eosinophils Absolute: 0 10*3/uL (ref 0.0–0.7)
Eosinophils Relative: 0 %
HEMATOCRIT: 39 % (ref 36.0–46.0)
HEMOGLOBIN: 12.7 g/dL (ref 12.0–15.0)
LYMPHS ABS: 2.4 10*3/uL (ref 0.7–4.0)
LYMPHS PCT: 24 %
MCH: 29.7 pg (ref 26.0–34.0)
MCHC: 32.6 g/dL (ref 30.0–36.0)
MCV: 91.3 fL (ref 78.0–100.0)
Monocytes Absolute: 0.6 10*3/uL (ref 0.1–1.0)
Monocytes Relative: 6 %
NEUTROS ABS: 6.9 10*3/uL (ref 1.7–7.7)
NEUTROS PCT: 70 %
Platelets: 322 10*3/uL (ref 150–400)
RBC: 4.27 MIL/uL (ref 3.87–5.11)
RDW: 13.9 % (ref 11.5–15.5)
WBC: 9.9 10*3/uL (ref 4.0–10.5)

## 2017-02-24 LAB — ABO/RH: ABO/RH(D): A POS

## 2017-02-24 LAB — COMPREHENSIVE METABOLIC PANEL
ALK PHOS: 96 U/L (ref 38–126)
ALT: 35 U/L (ref 14–54)
AST: 32 U/L (ref 15–41)
Albumin: 3.3 g/dL — ABNORMAL LOW (ref 3.5–5.0)
Anion gap: 9 (ref 5–15)
BUN: 19 mg/dL (ref 6–20)
CALCIUM: 8.9 mg/dL (ref 8.9–10.3)
CO2: 32 mmol/L (ref 22–32)
CREATININE: 1.02 mg/dL — AB (ref 0.44–1.00)
Chloride: 100 mmol/L — ABNORMAL LOW (ref 101–111)
GFR, EST AFRICAN AMERICAN: 55 mL/min — AB (ref >60)
GFR, EST NON AFRICAN AMERICAN: 47 mL/min — AB (ref >60)
Glucose, Bld: 117 mg/dL — ABNORMAL HIGH (ref 65–99)
Potassium: 3.8 mmol/L (ref 3.5–5.1)
Sodium: 141 mmol/L (ref 135–145)
Total Bilirubin: 0.5 mg/dL (ref 0.3–1.2)
Total Protein: 6.7 g/dL (ref 6.5–8.1)

## 2017-02-24 LAB — PROTIME-INR
INR: 0.97
PROTHROMBIN TIME: 12.8 s (ref 11.4–15.2)

## 2017-02-24 LAB — TYPE AND SCREEN
ABO/RH(D): A POS
Antibody Screen: NEGATIVE

## 2017-02-24 LAB — I-STAT TROPONIN, ED: TROPONIN I, POC: 0 ng/mL (ref 0.00–0.08)

## 2017-02-24 MED ORDER — SODIUM CHLORIDE 0.9 % IV BOLUS (SEPSIS)
1000.0000 mL | Freq: Once | INTRAVENOUS | Status: AC
  Administered 2017-02-24: 1000 mL via INTRAVENOUS

## 2017-02-24 MED ORDER — MEMANTINE HCL ER 28 MG PO CP24: 28.0000 mg | ORAL_CAPSULE | Freq: Every day | ORAL | Status: DC

## 2017-02-24 MED ORDER — DONEPEZIL HCL 10 MG PO TABS: 10.0000 mg | ORAL_TABLET | Freq: Two times a day (BID) | ORAL | Status: DC

## 2017-02-24 MED ORDER — QUETIAPINE FUMARATE 25 MG PO TABS: 25.0000 mg | ORAL_TABLET | ORAL | Status: DC

## 2017-02-24 MED ORDER — LEVOTHYROXINE SODIUM 100 MCG PO TABS: 100.0000 ug | ORAL_TABLET | Freq: Every day | ORAL | Status: DC

## 2017-02-24 MED ORDER — DIVALPROEX SODIUM 125 MG PO CSDR: 125.0000 mg | DELAYED_RELEASE_CAPSULE | Freq: Every day | ORAL | Status: DC

## 2017-02-24 MED ORDER — HYDROCHLOROTHIAZIDE 25 MG PO TABS: 25.0000 mg | ORAL_TABLET | Freq: Every day | ORAL | Status: DC

## 2017-02-24 NOTE — ED Notes (Signed)
Pt was able to eat the entire meal tray independently.  She is alert and oriented to herself.  Pt is asking to use the bathroom

## 2017-02-24 NOTE — Progress Notes (Signed)
Palliative Medicine RN Note: Initial meeting with pt's son and daughter. I had come to see pt earlier; she was uncooperative and non-verbal with me. With family present she can say that she is at the hospital and that these are her children.  Met with the family away from the pt. They report that she has had multiple falls in the last several months and that she is unable to follow the instructions PT gives her. Pt's advance directives (which were copied for the chart) state that pt does not want long-term ventilation, but she DOES want feeding tube/IV fluids to prolong her life. It also states that her family can override these stated wishes.   They explained to me that Dr Darl Householder gave them 2 options: back to [facility], residential hospice, or be admitted for another CT tomorrow to determine status of bleed. We discussed what he said and the type of nursing care she would get at each of the offered locations. Family reports that they have been told that Mrs Cameron is NOT a candidate for surgical intervention d/t anesthesia risk.   Mrs Villarruel children are calm but sad. They have more questions about inpt hospice. Provided a list of facilities; they choose [facility] as first choice and [facility] as second. Spoke with Harmon Pier w BP; she will come to ED to answer questions that the family has.    UPDATE: 1430 Rec'd call from Dr Darl Householder, who reports that family is fairly upset and wants pt to be admitted to [facility] for additional scans; he will make plans to admit. PMT will plan on seeing pt this afternoon to ensure that they have no further questions.  Marjie Skiff Tenise Stetler, RN, BSN, [facility] 02/24/2017 2:47 PM Cell [phone] 8:00-4:00 Monday-Friday Office [phone]

## 2017-02-24 NOTE — ED Notes (Signed)
Only belonging pt had was black shoes, pink nightgown and a undergarment-sent home with pt in ambulance

## 2017-02-24 NOTE — ED Notes (Signed)
Moved pt to hall at this time, right by nurses station for greater visibility.  Meal given and PT[facility] called for transport back to [place]

## 2017-02-24 NOTE — ED Notes (Signed)
[facility] contacted to transport patient back to [facility]

## 2017-02-24 NOTE — ED Notes (Signed)
Erika Whitney, daughter, called with update.

## 2017-02-24 NOTE — ED Notes (Signed)
Patient transported to CT 

## 2017-02-24 NOTE — Progress Notes (Signed)
Palliative Medicine RN Note: Rec'd consult order from ED. Pt appears to be appropriate for inpt hospice. Called daughter Mariel Aloe [phone]) to discuss, as she is not at [facility]. She will be at the hospital soon and would prefer to speak in person. Provided our phone number; she will let me know when she gets here.  Marjie Skiff Dalonda Simoni, RN, BSN, [facility] 02/24/2017 10:07 AM Cell [phone] 8:00-4:00 Monday-Friday Office [phone]

## 2017-02-24 NOTE — Consult Note (Addendum)
Medical Consultation   Erika Whitney  IRC:[id number]  DOB: February 01, 1925  DOA: 02/24/2017  PCP: Stephens Shire, MD   Outpatient Specialists: none   Requesting physician: Dr. Darl Householder  Reason for consultation: SDH   History of Present Illness: Erika Whitney is an 83 y.o. female hypertension, dementia, breast cancer, hypothyroidism.  Level V caveat applies this patient has very advanced dementia and unable to provide reliable history at this time. History provided by EDP and family members.  Patient reportedly had an unwitnessed fall and was found by [facility] staff at around 8:30 in the morning with head laceration and copious bleeding. No other reported changes in behavior such as complaints of chest pain, palpitations, fevers, shortness of breath, productive cough, history topics, Donald pain, diarrhea, focal neurological deficits. Patient has had multiple falls at the nursing home over the last several weeks. Patient is a DO NOT RESUSCITATE.    Review of Systems:  ROS As per HPI otherwise all other systems reviewed and are negative   Past Medical History: Past Medical History:  Diagnosis Date  . Arthritis   . Cancer (HCC)    BREAST  . Dementia   . Rectal prolapse     Past Surgical History: Past Surgical History:  Procedure Laterality Date  . ABDOMINAL HYSTERECTOMY    . APPENDECTOMY    . CESAREAN SECTION    . COLONOSCOPY N/A 06/28/2013   Procedure: COLONOSCOPY;  Surgeon: Beryle Beams, MD;  Location: [facility];  Service: Endoscopy;  Laterality: N/A;  . LAPAROSCOPIC SIGMOID COLECTOMY N/A 06/30/2013   Procedure: LAPAROSCOPIC RECTOPEXY WITH SIGMOID COLECTOMY;  Surgeon: Leighton Ruff, MD;  Location: [facility];  Service: General;  Laterality: N/A;  . LEFT MASTECTOMY    . MASTECTOMY       Allergies:   Allergies  Allergen Reactions  . Ibuprofen     Makes blood pressure go up     Social History:  reports that she has never smoked.  She has never used smokeless tobacco. She reports that she does not drink alcohol or use drugs.   Family History: No family history on file. Patient unable to provide family history due to her advanced dementia.  Physical Exam: Vitals:   02/24/17 1117 02/24/17 1200 02/24/17 1300 02/24/17 1400  BP: (!) 150/63 (!) 166/63 (!) 162/65 (!) 162/62  Pulse: 65 63 65 60  Resp: 16 14 14 13   Temp:      TempSrc:      SpO2: 99% 90% 97% 96%    General:  Appears calm and comfortable Eyes:  PERRL, EOMI, normal lids, iris ENT:  grossly normal hearing, lips & tongue, mmm Neck:  no LAD, masses or thyromegaly Cardiovascular:  RRR, no m/r/g. No LE edema.  Respiratory:  CTA bilaterally, no w/r/r. Normal respiratory effort. Abdomen:  soft, ntnd, NABS Skin:  Right frontal scalp laceration noted with underlying hematoma. No rashes appreciated.  Musculoskeletal:  grossly normal tone BUE/BLE, good ROM, no bony abnormality Psychiatric: Confused. Pleasant. Follows basic commands. Neurologic:  CN 2-12 grossly intact, moves all extremities in coordinated fashion, sensation intact  Data reviewed:  I have personally reviewed following labs and imaging studies Labs:  CBC:  Recent Labs Lab 02/24/17 0913  WBC 9.9  NEUTROABS 6.9  HGB 12.7  HCT 39.0  MCV 91.3  PLT 510    Basic Metabolic Panel:  Recent Labs Lab 02/24/17 0913  NA 141  K 3.8  CL 100*  CO2 32  GLUCOSE 117*  BUN 19  CREATININE 1.02*  CALCIUM 8.9   GFR CrCl cannot be calculated (Unknown ideal weight.). Liver Function Tests:  Recent Labs Lab 02/24/17 0913  AST 32  ALT 35  ALKPHOS 96  BILITOT 0.5  PROT 6.7  ALBUMIN 3.3*   No results for input(s): LIPASE, AMYLASE in the last 168 hours. No results for input(s): AMMONIA in the last 168 hours. Coagulation profile  Recent Labs Lab 02/24/17 0913  INR 0.97    Cardiac Enzymes: No results for input(s): CKTOTAL, CKMB, CKMBINDEX, TROPONINI in the last 168  hours. BNP: Invalid input(s): POCBNP CBG: No results for input(s): GLUCAP in the last 168 hours. D-Dimer No results for input(s): DDIMER in the last 72 hours. Hgb A1c No results for input(s): HGBA1C in the last 72 hours. Lipid Profile No results for input(s): CHOL, HDL, LDLCALC, TRIG, CHOLHDL, LDLDIRECT in the last 72 hours. Thyroid function studies No results for input(s): TSH, T4TOTAL, T3FREE, THYROIDAB in the last 72 hours.  Invalid input(s): FREET3 Anemia work up No results for input(s): VITAMINB12, FOLATE, FERRITIN, TIBC, IRON, RETICCTPCT in the last 72 hours. Urinalysis No results found for: COLORURINE, APPEARANCEUR, LABSPEC, [place], GLUCOSEU, HGBUR, BILIRUBINUR, KETONESUR, PROTEINUR, UROBILINOGEN, NITRITE, [place]   Microbiology No results found for this or any previous visit (from the past 240 hour(s)).     Inpatient Medications:   Scheduled Meds: . divalproex  125 mg Oral Daily  . donepezil  10 mg Oral BID  . hydrochlorothiazide  25 mg Oral Daily  . [START ON 02/25/2017] levothyroxine  100 mcg Oral QAC breakfast  . memantine  28 mg Oral Daily  . [START ON 02/25/2017] QUEtiapine  25 mg Oral BH-[id number]   Continuous Infusions:   Radiological Exams on Admission: Ct Head Wo Contrast  Result Date: 02/24/2017 CLINICAL DATA:  Followup subdural hemorrhage. EXAM: CT HEAD WITHOUT CONTRAST TECHNIQUE: Contiguous axial images were obtained from the base of the skull through the vertex without intravenous contrast. COMPARISON:  02/24/2017 at 8:20 a.m. FINDINGS: Brain: There has been no change in the acute right subdural hemorrhage since the earlier exam. The degree of mass effect and the mild midline shift to the left are also stable. There is no evidence of new intracranial hemorrhage. There is no hydrocephalus. There is no evidence of ischemic infarct. Vascular: No hyperdense vessel or unexpected calcification. Skull: No skull fracture. Sinuses/Orbits: No acute finding. Other:  Right frontotemporal scalp hematoma is also unchanged. IMPRESSION: 1. Acute right-sided subdural hematoma and its associated mass effect is unchanged from the earlier exam. No new intracranial hemorrhage. No new abnormalities. Electronically Signed   By: Lajean Manes M.D.   On: 02/24/2017 15:00   Ct Head Wo Contrast  Result Date: 02/24/2017 CLINICAL DATA:  Pt in via [facility] from [facility]. According to staff pt had unwitnessed fall, but was found in bed and fast asleep by staff. Hx of dementia AND multiple falls EXAM: CT HEAD WITHOUT CONTRAST CT CERVICAL SPINE WITHOUT CONTRAST TECHNIQUE: Multidetector CT imaging of the head and cervical spine was performed following the standard protocol without intravenous contrast. Multiplanar CT image reconstructions of the cervical spine were also generated. COMPARISON:  02/15/2017 FINDINGS: CT HEAD FINDINGS Brain: No evidence of acute infarction, ventriculomegaly, or mass effect. Generalized cerebral atrophy. Periventricular white matter low attenuation likely secondary to microangiopathy. Hyperdense extra-axial fluid collection along the right cerebral convexity measuring 11 mm in the parietal region with 4 mm of  right to left midline shift. Mild mass effect on the right lateral ventricle. No intraparenchymal hemorrhage. Vascular: Cerebrovascular atherosclerotic calcifications are noted. Skull: Negative for fracture or focal lesion. Sinuses/Orbits: Visualized portions of the orbits are unremarkable. Visualized portions of the paranasal sinuses and mastoid air cells are unremarkable. Other: None. CT CERVICAL SPINE FINDINGS Alignment: 4 mm retrolisthesis C3 on C4. Skull base and vertebrae: No acute fracture. No primary bone lesion or focal pathologic process. Soft tissues and spinal canal: No prevertebral fluid or swelling. No visible canal hematoma. Disc levels: Degenerative disc disease with disc height loss at C3-4 and C4-5 with broad-based disc osteophyte complexes  and bilateral facet arthropathy. Right foraminal narrowing at C4-5. Bilateral facet arthropathy at C5-6 and C6-7. Upper chest: Lung apices are clear. Other: No fluid collection or hematoma. IMPRESSION: 1. Acute subdural hematoma along the right cerebral convexity measuring 11 mm in thickness with 4 mm of right to left midline shift. 2.  No acute osseous injury of the cervical spine. Critical Value/emergent results were called by telephone at the time of interpretation on 02/24/2017 at 8:46 am to Dr. Shirlyn Goltz , who verbally acknowledged these results. Electronically Signed   By: Kathreen Devoid   On: 02/24/2017 08:47   Ct Cervical Spine Wo Contrast  Result Date: 02/24/2017 CLINICAL DATA:  Pt in via [facility] from [facility] According to staff pt had unwitnessed fall, but was found in bed and fast asleep by staff. Hx of dementia AND multiple falls EXAM: CT HEAD WITHOUT CONTRAST CT CERVICAL SPINE WITHOUT CONTRAST TECHNIQUE: Multidetector CT imaging of the head and cervical spine was performed following the standard protocol without intravenous contrast. Multiplanar CT image reconstructions of the cervical spine were also generated. COMPARISON:  02/15/2017 FINDINGS: CT HEAD FINDINGS Brain: No evidence of acute infarction, ventriculomegaly, or mass effect. Generalized cerebral atrophy. Periventricular white matter low attenuation likely secondary to microangiopathy. Hyperdense extra-axial fluid collection along the right cerebral convexity measuring 11 mm in the parietal region with 4 mm of right to left midline shift. Mild mass effect on the right lateral ventricle. No intraparenchymal hemorrhage. Vascular: Cerebrovascular atherosclerotic calcifications are noted. Skull: Negative for fracture or focal lesion. Sinuses/Orbits: Visualized portions of the orbits are unremarkable. Visualized portions of the paranasal sinuses and mastoid air cells are unremarkable. Other: None. CT CERVICAL SPINE FINDINGS Alignment: 4 mm  retrolisthesis C3 on C4. Skull base and vertebrae: No acute fracture. No primary bone lesion or focal pathologic process. Soft tissues and spinal canal: No prevertebral fluid or swelling. No visible canal hematoma. Disc levels: Degenerative disc disease with disc height loss at C3-4 and C4-5 with broad-based disc osteophyte complexes and bilateral facet arthropathy. Right foraminal narrowing at C4-5. Bilateral facet arthropathy at C5-6 and C6-7. Upper chest: Lung apices are clear. Other: No fluid collection or hematoma. IMPRESSION: 1. Acute subdural hematoma along the right cerebral convexity measuring 11 mm in thickness with 4 mm of right to left midline shift. 2.  No acute osseous injury of the cervical spine. Critical Value/emergent results were called by telephone at the time of interpretation on 02/24/2017 at 8:46 am to Dr. Shirlyn Goltz , who verbally acknowledged these results. Electronically Signed   By: Kathreen Devoid   On: 02/24/2017 08:47    Impression/Recommendations Active Problems:   Hypothyroidism   Anxiety and depression   Dementia   Neuropathic pain   Laceration of head   Essential hypertension  Subdural hemorrhage: Stable. Safe for discharge. No neurological deficits and doubt any  further progression though this is always a possibility. Original CT imaging performed approximately 4-5 hours after initial injury with repeat imaging performed an additional 3-4 hours later all showing stable subdural bleed. Patient is not on any blood thinning medications. Family is aware of CT findings and feel very reassured that she is not in imminent danger. Patient to be transferred back to skilled nursing facility. I personally discussed the case with Dr. Saintclair Halsted of neurosurgery - No further intervention  Dementia: Patient cared for in a memory unit in an SNF. She currently will follow basic commands and recognizes children. Patient able to perform some of her ADLs with assistance and continues to eat and  ambulate. - Continue Aricept and Namenda  Recurrent falls: Patient has had multiple falls over the recent past. Discussed the likely progression of this recurring problem with the family. Family where there is a fine balance this patient's approach end-stage dementia of limiting her activity and thus preserving them physically and allowing them to continue to function in enjoyable activities knowing that there is an inherent risk that a major themselves. Hospice was consult and and patient was originally set to go to became place if her brain bleed was found to be progressive and likely to cause her demise. - Family to coordinate with SNF with regards to acceptable activities for the patient  Hypertension: Patient's antihypertensive medications were administered during her stay in the emergency room.  Depression/mood: Patient's Depakote, Seroquel were ordered during her time in the emergency room.  Head laceration noted and repaired by EDP - daily antibiotic ointment until stitches removed in 7 days.   Thank you for this consultation.  Our [facility] hospitalist team will follow the patient with you.   Time Spent: >70 min in direct consultation with patient, patient's family, EDP and neurosurgical specialists and in reviewing labs and providing interpretations to family.  Aidon Klemens J M.D. [facility] 02/24/2017, 4:25 PM

## 2017-02-24 NOTE — ED Provider Notes (Addendum)
[place] DEPT Provider Note   CSN: [id number] Arrival date & time: 02/24/17  2831     History   Chief Complaint Chief Complaint  Patient presents with  . Head Laceration    HPI Erika Whitney is a 83 y.o. female history of breast cancer, dementia, arthritis, here presenting with unwitnessed fall. Patient is from [facility]. She was found on the floor after unwitnessed fall today. Patient is not on blood thinners and she is demented and unable to give me much history. According to the daughter, patient does have a DNR. Tdap updated recently.   The history is provided by the patient.   Level V caveat- dementia   Past Medical History:  Diagnosis Date  . Arthritis   . Cancer (HCC)    BREAST  . Dementia   . Rectal prolapse     Patient Active Problem List   Diagnosis Date Noted  . Rectal prolapse 06/28/2013  . Vasovagal syncope 06/28/2013  . GI bleed 06/27/2013  . Rectal bleed 06/27/2013  . Dyslipidemia 06/27/2013  . Hypothyroidism 06/27/2013  . Anxiety and depression 06/27/2013  . Dementia 06/27/2013  . Neuropathic pain 06/27/2013    Past Surgical History:  Procedure Laterality Date  . ABDOMINAL HYSTERECTOMY    . APPENDECTOMY    . CESAREAN SECTION    . COLONOSCOPY N/A 06/28/2013   Procedure: COLONOSCOPY;  Surgeon: Beryle Beams, MD;  Location: [facility];  Service: Endoscopy;  Laterality: N/A;  . LAPAROSCOPIC SIGMOID COLECTOMY N/A 06/30/2013   Procedure: LAPAROSCOPIC RECTOPEXY WITH SIGMOID COLECTOMY;  Surgeon: Leighton Ruff, MD;  Location: [facility];  Service: General;  Laterality: N/A;  . LEFT MASTECTOMY    . MASTECTOMY      OB History    No data available       Home Medications    Prior to Admission medications   Medication Sig Start Date End Date Taking? Authorizing Provider  acetaminophen (TYLENOL) 500 MG tablet Take 500 mg by mouth every 4 (four) hours as needed for headache.   Yes [provider]  alum & mag  hydroxide-simeth (Rembrandt) 200-200-20 MG/5ML suspension Take 30 mLs by mouth every 6 (six) hours as needed for indigestion or heartburn.   Yes [provider]  aspirin 81 MG chewable tablet Chew 81 mg by mouth daily.   Yes [provider]  clonazePAM (KLONOPIN) 0.5 MG tablet Take 0.5 mg by mouth daily.   Yes [provider]  clonazePAM (KLONOPIN) 1 MG tablet Take 1 mg by mouth at bedtime.   Yes [provider]  dextromethorphan-guaiFENesin (TUSSIN DM) 10-100 MG/5ML liquid Take 5 mLs by mouth every 6 (six) hours as needed. Patient taking differently: Take 10 mLs by mouth every 6 (six) hours as needed for cough.  05/26/15  Yes Carmin Muskrat, MD  Dextromethorphan-Quinidine (NUEDEXTA) 20-10 MG CAPS Take 1 capsule by mouth every 12 (twelve) hours.   Yes [provider]  divalproex (DEPAKOTE SPRINKLE) 125 MG capsule Take 125 mg by mouth daily.   Yes [provider]  donepezil (ARICEPT) 10 MG tablet Take 10 mg by mouth 2 (two) times daily.    Yes [provider]  Enteral Nutrition Supplies MISC Take 118 mLs by mouth 3 (three) times daily.   Yes [provider]  hydrochlorothiazide (HYDRODIURIL) 12.5 MG tablet Take 25 mg by mouth daily.   Yes [provider]  hydrOXYzine (ATARAX/VISTARIL) 25 MG tablet Take 50 mg by mouth 3 (three) times daily as needed.  Yes [provider]  levothyroxine (SYNTHROID, LEVOTHROID) 100 MCG tablet Take 100 mcg by mouth daily before breakfast.   Yes [provider]  loperamide (IMODIUM A-D) 2 MG tablet Take 2 mg by mouth 4 (four) times daily as needed for diarrhea or loose stools.   Yes [provider]  magnesium hydroxide (MILK OF MAGNESIA) 400 MG/5ML suspension Take 30 mLs by mouth at bedtime as needed for mild constipation.   Yes [provider]  MELATONIN PO Take 10 mg by mouth at bedtime.    Yes [provider]  memantine (NAMENDA XR) 28 MG CP24 24  hr capsule Take 28 mg by mouth daily.   Yes [provider]  mirtazapine (REMERON) 30 MG tablet Take 30 mg by mouth at bedtime.   Yes [provider]  neomycin-bacitracin-polymyxin (NEOSPORIN) ointment Apply 1 application topically as needed for wound care (for skin tear abrasions). apply to eye   Yes [provider]  Pediatric Multiple Vit-C-FA (MULTIVITAMIN ANIMAL SHAPES, WITH CA/FA,) with C & FA chewable tablet Chew 1 tablet by mouth daily.   Yes [provider]  QUEtiapine (SEROQUEL) 25 MG tablet Take 25 mg by mouth every morning.    Yes [provider]  QUEtiapine (SEROQUEL) 25 MG tablet Take 50 mg by mouth every evening.   Yes [provider]  simvastatin (ZOCOR) 10 MG tablet Take 10 mg by mouth at bedtime.    Yes [provider]  aspirin EC 81 MG tablet Take 81 mg by mouth daily.    [provider]  guaifenesin (ROBITUSSIN) 100 MG/5ML syrup Take 200 mg by mouth 4 (four) times daily as needed for cough or congestion.    [provider]  Multiple Vitamins-Minerals (MULTIVITAMIN PO) Take 1 tablet by mouth daily.    [provider]    Family History No family history on file.  Social History Social History  Substance Use Topics  . Smoking status: Never Smoker  . Smokeless tobacco: Never Used  . Alcohol use No     Allergies   Ibuprofen   Review of Systems Review of Systems  Skin: Positive for wound.  All other systems reviewed and are negative.    Physical Exam Updated Vital Signs BP (!) 160/60   Pulse (!) 57   Temp 98.1 F (36.7 C) (Oral)   SpO2 98%   Physical Exam  Constitutional: She appears well-developed.  HENT:  Head: Normocephalic.  Small abrasion that stopped bleeding on R forehead with forehead hematoma   Eyes: Pupils are equal, round, and reactive to light. Conjunctivae and EOM are normal.  Neck: Normal range of motion. Neck supple.  Cardiovascular: Normal rate,  regular rhythm and normal heart sounds.   Pulmonary/Chest: Effort normal and breath sounds normal. No respiratory distress. She has no wheezes.  Abdominal: Soft. Bowel sounds are normal. She exhibits no distension. There is no tenderness.  Musculoskeletal: Normal range of motion.  No obvious spinal tenderness or deformity. Pelvis stable. Nl ROM bilateral hips. No obvious extremity trauma   Neurological: She is alert.  A & O x 1. Nl strength bilateral arms and legs. CN 2-12 intact.   Skin: Skin is warm.  Psychiatric:  Unable   Nursing note and vitals reviewed.    ED Treatments / Results  Labs (all labs ordered are listed, but only abnormal results are displayed) Labs Reviewed  COMPREHENSIVE METABOLIC PANEL - Abnormal; Notable for the following:       Result Value  Chloride 100 (*)    Glucose, Bld 117 (*)    Creatinine, Ser 1.02 (*)    Albumin 3.3 (*)    GFR calc non Af Amer 47 (*)    GFR calc Af Amer 55 (*)    All other components within normal limits  CBC WITH DIFFERENTIAL/PLATELET  PROTIME-INR  VALPROIC ACID LEVEL  I-STAT TROPONIN, ED  TYPE AND SCREEN    EKG  EKG Interpretation None       Radiology Ct Head Wo Contrast  Result Date: 02/24/2017 CLINICAL DATA:  Pt in via [facility] from [facility]. According to staff pt had unwitnessed fall, but was found in bed and fast asleep by staff. Hx of dementia AND multiple falls EXAM: CT HEAD WITHOUT CONTRAST CT CERVICAL SPINE WITHOUT CONTRAST TECHNIQUE: Multidetector CT imaging of the head and cervical spine was performed following the standard protocol without intravenous contrast. Multiplanar CT image reconstructions of the cervical spine were also generated. COMPARISON:  02/15/2017 FINDINGS: CT HEAD FINDINGS Brain: No evidence of acute infarction, ventriculomegaly, or mass effect. Generalized cerebral atrophy. Periventricular white matter low attenuation likely secondary to microangiopathy. Hyperdense extra-axial fluid  collection along the right cerebral convexity measuring 11 mm in the parietal region with 4 mm of right to left midline shift. Mild mass effect on the right lateral ventricle. No intraparenchymal hemorrhage. Vascular: Cerebrovascular atherosclerotic calcifications are noted. Skull: Negative for fracture or focal lesion. Sinuses/Orbits: Visualized portions of the orbits are unremarkable. Visualized portions of the paranasal sinuses and mastoid air cells are unremarkable. Other: None. CT CERVICAL SPINE FINDINGS Alignment: 4 mm retrolisthesis C3 on C4. Skull base and vertebrae: No acute fracture. No primary bone lesion or focal pathologic process. Soft tissues and spinal canal: No prevertebral fluid or swelling. No visible canal hematoma. Disc levels: Degenerative disc disease with disc height loss at C3-4 and C4-5 with broad-based disc osteophyte complexes and bilateral facet arthropathy. Right foraminal narrowing at C4-5. Bilateral facet arthropathy at C5-6 and C6-7. Upper chest: Lung apices are clear. Other: No fluid collection or hematoma. IMPRESSION: 1. Acute subdural hematoma along the right cerebral convexity measuring 11 mm in thickness with 4 mm of right to left midline shift. 2.  No acute osseous injury of the cervical spine. Critical Value/emergent results were called by telephone at the time of interpretation on 02/24/2017 at 8:46 am to Dr. Shirlyn Goltz , who verbally acknowledged these results. Electronically Signed   By: Kathreen Devoid   On: 02/24/2017 08:47   Ct Cervical Spine Wo Contrast  Result Date: 02/24/2017 CLINICAL DATA:  Pt in via [facility] from [facility]. According to staff pt had unwitnessed fall, but was found in bed and fast asleep by staff. Hx of dementia AND multiple falls EXAM: CT HEAD WITHOUT CONTRAST CT CERVICAL SPINE WITHOUT CONTRAST TECHNIQUE: Multidetector CT imaging of the head and cervical spine was performed following the standard protocol without intravenous contrast. Multiplanar  CT image reconstructions of the cervical spine were also generated. COMPARISON:  02/15/2017 FINDINGS: CT HEAD FINDINGS Brain: No evidence of acute infarction, ventriculomegaly, or mass effect. Generalized cerebral atrophy. Periventricular white matter low attenuation likely secondary to microangiopathy. Hyperdense extra-axial fluid collection along the right cerebral convexity measuring 11 mm in the parietal region with 4 mm of right to left midline shift. Mild mass effect on the right lateral ventricle. No intraparenchymal hemorrhage. Vascular: Cerebrovascular atherosclerotic calcifications are noted. Skull: Negative for fracture or focal lesion. Sinuses/Orbits: Visualized portions of the orbits are unremarkable. Visualized portions of  the paranasal sinuses and mastoid air cells are unremarkable. Other: None. CT CERVICAL SPINE FINDINGS Alignment: 4 mm retrolisthesis C3 on C4. Skull base and vertebrae: No acute fracture. No primary bone lesion or focal pathologic process. Soft tissues and spinal canal: No prevertebral fluid or swelling. No visible canal hematoma. Disc levels: Degenerative disc disease with disc height loss at C3-4 and C4-5 with broad-based disc osteophyte complexes and bilateral facet arthropathy. Right foraminal narrowing at C4-5. Bilateral facet arthropathy at C5-6 and C6-7. Upper chest: Lung apices are clear. Other: No fluid collection or hematoma. IMPRESSION: 1. Acute subdural hematoma along the right cerebral convexity measuring 11 mm in thickness with 4 mm of right to left midline shift. 2.  No acute osseous injury of the cervical spine. Critical Value/emergent results were called by telephone at the time of interpretation on 02/24/2017 at 8:46 am to Dr. Shirlyn Goltz , who verbally acknowledged these results. Electronically Signed   By: Kathreen Devoid   On: 02/24/2017 08:47    Procedures Procedures (including critical care time)  CRITICAL CARE Performed by: Wandra Arthurs   Total critical  care time: 30 minutes  Critical care time was exclusive of separately billable procedures and treating other patients.  Critical care was necessary to treat or prevent imminent or life-threatening deterioration.  Critical care was time spent personally by me on the following activities: development of treatment plan with patient and/or surrogate as well as nursing, discussions with consultants, evaluation of patient's response to treatment, examination of patient, obtaining history from patient or surrogate, ordering and performing treatments and interventions, ordering and review of laboratory studies, ordering and review of radiographic studies, pulse oximetry and re-evaluation of patient's condition.   Medications Ordered in ED Medications  sodium chloride 0.9 % bolus 1,000 mL (1,000 mLs Intravenous New Bag/Given 02/24/17 0920)     Initial Impression / Assessment and Plan / ED Course  I have reviewed the triage vital signs and the nursing notes.  Pertinent labs & imaging results that were available during my care of the patient were reviewed by me and considered in my medical decision making (see chart for details).     Erika Whitney is a 83 y.o. female here with fall. Unwitnessed fall at nursing home. Patient demented at baseline and moving all extremities and has nonfocal neuro exam. No signs of back or hip or extremity trauma. Will get CT head/neck.   9 am CT showed R subdural with midline shift. Called Dr. Saintclair Halsted from neurosurgery. He reviewed the case and the images. He felt that patient is DNR and is not a neurosurgical candidate. He recommend either admission for observation or palliative or return back to nursing home. I called daughter, who will bring DNR. Will consult palliative care.   1:09 PM Palliative care saw patient and talked to family. Family not ready for hospice. They want her to be observed in the hospital mainly due to uncertainty of her prognosis. Will admit for  observation. They understand that either way, she is not a surgical candidate and may die from this.   3:09 PM Dr. Marily Memos saw patient. Had extensive discussion with family. He also called Dr. Saintclair Halsted, who recommend stat repeat CT. Repeat CT unchanged. She doesn't need hospice since this may not be a terminal event. She will be sent back to the nursing home. Updated daughter, Olin Hauser.   Final Clinical Impressions(s) / ED Diagnoses   Final diagnoses:  None    New Prescriptions New Prescriptions  No medications on file     Drenda Freeze, MD 02/24/17 1315    Drenda Freeze, MD 02/24/17 [phone]

## 2017-02-24 NOTE — ED Notes (Signed)
Patient transported to X-ray 

## 2017-02-24 NOTE — ED Notes (Signed)
Alert to self

## 2017-02-24 NOTE — Discharge Instructions (Signed)
She had some bleeding from her brain from the fall. We did CT scans that showed that there is no further bleeding.   SHE NEEDS FALL PRECAUTIONS!!!   Please have an aide to watch her so she doesn't fall.   Avoid ANY NSAIDS or ASA.   See your doctor. Call Dr. Windy Carina office for follow up   Return to ER if she has another fall, lethargy, confusion, worse weakness

## 2017-02-24 NOTE — ED Notes (Signed)
Pt given a pillow, repositioned. Pt does not appear in any distress at this time.   Bagged pt belongings and placed a label on the bag.  Pt has a pink shirt, a undergarment and black pants

## 2017-02-24 NOTE — ED Triage Notes (Signed)
Pt in via [facility] from [facility]. According to staff pt had unwitnessed fall, but was found in bed and fast asleep by staff. Hx of dementia & multiple falls. Denies any pain. Pt @ baseline according to family. A&O x1 to self. Lac noted to R temporal aspect of head.

## 2017-02-24 NOTE — ED Notes (Signed)
Tele sitter activated.

## 2017-02-24 NOTE — Progress Notes (Signed)
CSW was consulted for [facility] placement. CSW spoke with Harmon Pier at [facility] and was informed that family is unsure a to which path to take. Doctors mentioned performing another CT scan of pt's head, and getting back to CSW with results which would determine the routine for pt care.     Erika Whitney Erika Whitney, MSW, [employer] [facility] Clinical Social Worker [phone]

## 2017-02-24 NOTE — ED Notes (Signed)
Spoke with Erika Whitney at [facility].  Pt is feeding herself

## 2017-02-24 NOTE — ED Notes (Signed)
Pt was helped to the bathroom to void, able to transfer to [facility] and from [facility] to stretcher with staff assistance.  Report given to [facility] who is here to transfer her home. Pt was changed back into her depend

## 2017-02-24 NOTE — ED Notes (Addendum)
MD on phone with neuro and updating family

## 2017-02-24 NOTE — ED Notes (Signed)
Call to [facility] and notified them that pt was being discharged and went over d/c paperwork with staff

## 2017-02-24 NOTE — ED Notes (Signed)
RN went to round and found patient standing at end of bed trying to put on shoes. RN got her back in bed. Bed alarm set up and tele safety monitoring initiated.

## 2017-04-01 ENCOUNTER — Other Ambulatory Visit: Payer: Self-pay | Admitting: Neurology

## 2017-04-01 DIAGNOSIS — S065X9A Traumatic subdural hemorrhage with loss of consciousness of unspecified duration, initial encounter: Secondary | ICD-10-CM

## 2017-04-01 DIAGNOSIS — S065XAA Traumatic subdural hemorrhage with loss of consciousness status unknown, initial encounter: Secondary | ICD-10-CM

## 2017-04-07 ENCOUNTER — Ambulatory Visit
Admission: RE | Admit: 2017-04-07 | Discharge: 2017-04-07 | Disposition: A | Discharge status: Home / Self Care | Payer: Medicare Other | Source: Ambulatory Visit | Attending: Neurology | Admitting: Neurology

## 2017-04-07 DIAGNOSIS — S065XAA Traumatic subdural hemorrhage with loss of consciousness status unknown, initial encounter: Secondary | ICD-10-CM

## 2017-04-07 DIAGNOSIS — S065X9A Traumatic subdural hemorrhage with loss of consciousness of unspecified duration, initial encounter: Secondary | ICD-10-CM

## 2017-04-30 ENCOUNTER — Emergency Department (HOSPITAL_COMMUNITY): Payer: Medicare Other

## 2017-04-30 ENCOUNTER — Encounter (HOSPITAL_COMMUNITY): Payer: Self-pay

## 2017-04-30 ENCOUNTER — Emergency Department (HOSPITAL_COMMUNITY)
Admission: EM | Admit: 2017-04-30 | Discharge: 2017-04-30 | Disposition: A | Discharge status: Home / Self Care | Payer: Medicare Other | Attending: Emergency Medicine | Admitting: Emergency Medicine

## 2017-04-30 DIAGNOSIS — W07XXXA Fall from chair, initial encounter: Secondary | ICD-10-CM | POA: Diagnosis not present

## 2017-04-30 DIAGNOSIS — S42241A 4-part fracture of surgical neck of right humerus, initial encounter for closed fracture: Secondary | ICD-10-CM | POA: Insufficient documentation

## 2017-04-30 DIAGNOSIS — Y999 Unspecified external cause status: Secondary | ICD-10-CM | POA: Diagnosis not present

## 2017-04-30 DIAGNOSIS — E039 Hypothyroidism, unspecified: Secondary | ICD-10-CM | POA: Diagnosis not present

## 2017-04-30 DIAGNOSIS — Y939 Activity, unspecified: Secondary | ICD-10-CM | POA: Insufficient documentation

## 2017-04-30 DIAGNOSIS — F0390 Unspecified dementia without behavioral disturbance: Secondary | ICD-10-CM | POA: Insufficient documentation

## 2017-04-30 DIAGNOSIS — Z7982 Long term (current) use of aspirin: Secondary | ICD-10-CM | POA: Diagnosis not present

## 2017-04-30 DIAGNOSIS — I10 Essential (primary) hypertension: Secondary | ICD-10-CM | POA: Insufficient documentation

## 2017-04-30 DIAGNOSIS — S4991XA Unspecified injury of right shoulder and upper arm, initial encounter: Secondary | ICD-10-CM | POA: Diagnosis present

## 2017-04-30 DIAGNOSIS — Z79899 Other long term (current) drug therapy: Secondary | ICD-10-CM | POA: Insufficient documentation

## 2017-04-30 DIAGNOSIS — Y92129 Unspecified place in nursing home as the place of occurrence of the external cause: Secondary | ICD-10-CM | POA: Diagnosis not present

## 2017-04-30 DIAGNOSIS — Z853 Personal history of malignant neoplasm of breast: Secondary | ICD-10-CM | POA: Diagnosis not present

## 2017-04-30 DIAGNOSIS — S42201A Unspecified fracture of upper end of right humerus, initial encounter for closed fracture: Secondary | ICD-10-CM

## 2017-04-30 LAB — I-STAT CHEM 8, ED
BUN: 32 mg/dL — AB (ref 6–20)
CALCIUM ION: 1.16 mmol/L (ref 1.15–1.40)
CREATININE: 1.1 mg/dL — AB (ref 0.44–1.00)
Chloride: 97 mmol/L — ABNORMAL LOW (ref 101–111)
GLUCOSE: 179 mg/dL — AB (ref 65–99)
HCT: 36 % (ref 36.0–46.0)
Hemoglobin: 12.2 g/dL (ref 12.0–15.0)
POTASSIUM: 4.3 mmol/L (ref 3.5–5.1)
Sodium: 138 mmol/L (ref 135–145)
TCO2: 31 mmol/L (ref 22–32)

## 2017-04-30 NOTE — Discharge Instructions (Signed)
Take tylenol 1000mg(2 extra strength) four times a day.  ° °
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
## 2017-05-24 ENCOUNTER — Inpatient Hospital Stay (HOSPITAL_COMMUNITY)
Admission: EM | Admit: 2017-05-24 | Discharge: 2017-05-29 | DRG: 871 | Disposition: E | Payer: Medicare Other | Attending: Internal Medicine | Admitting: Internal Medicine

## 2017-05-24 ENCOUNTER — Emergency Department (HOSPITAL_COMMUNITY): Payer: Medicare Other

## 2017-05-24 ENCOUNTER — Other Ambulatory Visit: Payer: Self-pay

## 2017-05-24 ENCOUNTER — Encounter (HOSPITAL_COMMUNITY): Payer: Self-pay

## 2017-05-24 DIAGNOSIS — E039 Hypothyroidism, unspecified: Secondary | ICD-10-CM | POA: Diagnosis present

## 2017-05-24 DIAGNOSIS — R945 Abnormal results of liver function studies: Secondary | ICD-10-CM

## 2017-05-24 DIAGNOSIS — A419 Sepsis, unspecified organism: Secondary | ICD-10-CM | POA: Diagnosis present

## 2017-05-24 DIAGNOSIS — Z886 Allergy status to analgesic agent status: Secondary | ICD-10-CM

## 2017-05-24 DIAGNOSIS — N170 Acute kidney failure with tubular necrosis: Secondary | ICD-10-CM | POA: Diagnosis present

## 2017-05-24 DIAGNOSIS — M1990 Unspecified osteoarthritis, unspecified site: Secondary | ICD-10-CM | POA: Diagnosis present

## 2017-05-24 DIAGNOSIS — R402362 Coma scale, best motor response, obeys commands, at arrival to emergency department: Secondary | ICD-10-CM | POA: Diagnosis present

## 2017-05-24 DIAGNOSIS — E860 Dehydration: Secondary | ICD-10-CM | POA: Diagnosis present

## 2017-05-24 DIAGNOSIS — E870 Hyperosmolality and hypernatremia: Secondary | ICD-10-CM | POA: Diagnosis present

## 2017-05-24 DIAGNOSIS — R112 Nausea with vomiting, unspecified: Secondary | ICD-10-CM

## 2017-05-24 DIAGNOSIS — Z8249 Family history of ischemic heart disease and other diseases of the circulatory system: Secondary | ICD-10-CM

## 2017-05-24 DIAGNOSIS — Z7189 Other specified counseling: Secondary | ICD-10-CM | POA: Diagnosis not present

## 2017-05-24 DIAGNOSIS — K560 Paralytic ileus: Secondary | ICD-10-CM | POA: Diagnosis present

## 2017-05-24 DIAGNOSIS — E785 Hyperlipidemia, unspecified: Secondary | ICD-10-CM | POA: Diagnosis present

## 2017-05-24 DIAGNOSIS — F419 Anxiety disorder, unspecified: Secondary | ICD-10-CM | POA: Diagnosis not present

## 2017-05-24 DIAGNOSIS — R7989 Other specified abnormal findings of blood chemistry: Secondary | ICD-10-CM

## 2017-05-24 DIAGNOSIS — K529 Noninfective gastroenteritis and colitis, unspecified: Secondary | ICD-10-CM

## 2017-05-24 DIAGNOSIS — R6521 Severe sepsis with septic shock: Secondary | ICD-10-CM | POA: Diagnosis present

## 2017-05-24 DIAGNOSIS — N179 Acute kidney failure, unspecified: Secondary | ICD-10-CM

## 2017-05-24 DIAGNOSIS — Z515 Encounter for palliative care: Secondary | ICD-10-CM | POA: Diagnosis not present

## 2017-05-24 DIAGNOSIS — Z90710 Acquired absence of both cervix and uterus: Secondary | ICD-10-CM | POA: Diagnosis not present

## 2017-05-24 DIAGNOSIS — R0902 Hypoxemia: Secondary | ICD-10-CM | POA: Diagnosis not present

## 2017-05-24 DIAGNOSIS — Z66 Do not resuscitate: Secondary | ICD-10-CM | POA: Diagnosis present

## 2017-05-24 DIAGNOSIS — R402132 Coma scale, eyes open, to sound, at arrival to emergency department: Secondary | ICD-10-CM | POA: Diagnosis present

## 2017-05-24 DIAGNOSIS — G9341 Metabolic encephalopathy: Secondary | ICD-10-CM | POA: Diagnosis present

## 2017-05-24 DIAGNOSIS — Z853 Personal history of malignant neoplasm of breast: Secondary | ICD-10-CM | POA: Diagnosis not present

## 2017-05-24 DIAGNOSIS — Z9012 Acquired absence of left breast and nipple: Secondary | ICD-10-CM | POA: Diagnosis not present

## 2017-05-24 DIAGNOSIS — R400 Somnolence: Secondary | ICD-10-CM | POA: Diagnosis not present

## 2017-05-24 DIAGNOSIS — K5641 Fecal impaction: Secondary | ICD-10-CM | POA: Diagnosis present

## 2017-05-24 DIAGNOSIS — N183 Chronic kidney disease, stage 3 (moderate): Secondary | ICD-10-CM | POA: Diagnosis present

## 2017-05-24 DIAGNOSIS — R402242 Coma scale, best verbal response, confused conversation, at arrival to emergency department: Secondary | ICD-10-CM | POA: Diagnosis present

## 2017-05-24 DIAGNOSIS — I129 Hypertensive chronic kidney disease with stage 1 through stage 4 chronic kidney disease, or unspecified chronic kidney disease: Secondary | ICD-10-CM | POA: Diagnosis present

## 2017-05-24 DIAGNOSIS — E876 Hypokalemia: Secondary | ICD-10-CM | POA: Diagnosis present

## 2017-05-24 DIAGNOSIS — R4182 Altered mental status, unspecified: Secondary | ICD-10-CM

## 2017-05-24 DIAGNOSIS — Z4659 Encounter for fitting and adjustment of other gastrointestinal appliance and device: Secondary | ICD-10-CM

## 2017-05-24 DIAGNOSIS — I4891 Unspecified atrial fibrillation: Secondary | ICD-10-CM | POA: Diagnosis present

## 2017-05-24 DIAGNOSIS — D72829 Elevated white blood cell count, unspecified: Secondary | ICD-10-CM | POA: Diagnosis present

## 2017-05-24 DIAGNOSIS — E861 Hypovolemia: Secondary | ICD-10-CM | POA: Diagnosis present

## 2017-05-24 DIAGNOSIS — E872 Acidosis, unspecified: Secondary | ICD-10-CM | POA: Diagnosis present

## 2017-05-24 DIAGNOSIS — F329 Major depressive disorder, single episode, unspecified: Secondary | ICD-10-CM | POA: Diagnosis not present

## 2017-05-24 DIAGNOSIS — L8990 Pressure ulcer of unspecified site, unspecified stage: Secondary | ICD-10-CM

## 2017-05-24 DIAGNOSIS — F0390 Unspecified dementia without behavioral disturbance: Secondary | ICD-10-CM | POA: Diagnosis not present

## 2017-05-24 DIAGNOSIS — Z79890 Hormone replacement therapy (postmenopausal): Secondary | ICD-10-CM

## 2017-05-24 DIAGNOSIS — K8020 Calculus of gallbladder without cholecystitis without obstruction: Secondary | ICD-10-CM

## 2017-05-24 DIAGNOSIS — I728 Aneurysm of other specified arteries: Secondary | ICD-10-CM | POA: Diagnosis present

## 2017-05-24 DIAGNOSIS — R740 Nonspecific elevation of levels of transaminase and lactic acid dehydrogenase [LDH]: Secondary | ICD-10-CM | POA: Diagnosis present

## 2017-05-24 LAB — URINALYSIS, COMPLETE (UACMP) WITH MICROSCOPIC
Glucose, UA: NEGATIVE mg/dL
HGB URINE DIPSTICK: NEGATIVE
Ketones, ur: NEGATIVE mg/dL
LEUKOCYTES UA: NEGATIVE
Nitrite: NEGATIVE
PROTEIN: NEGATIVE mg/dL
SQUAMOUS EPITHELIAL / LPF: NONE SEEN
Specific Gravity, Urine: 1.02 (ref 1.005–1.030)
pH: 5 (ref 5.0–8.0)

## 2017-05-24 LAB — CBC WITH DIFFERENTIAL/PLATELET
BASOS PCT: 0 %
BASOS PCT: 0 %
Basophils Absolute: 0 10*3/uL (ref 0.0–0.1)
Basophils Absolute: 0 10*3/uL (ref 0.0–0.1)
EOS PCT: 0 %
Eosinophils Absolute: 0 10*3/uL (ref 0.0–0.7)
Eosinophils Absolute: 0 10*3/uL (ref 0.0–0.7)
Eosinophils Relative: 0 %
HEMATOCRIT: 39.2 % (ref 36.0–46.0)
HEMATOCRIT: 35.8 % — AB (ref 36.0–46.0)
Hemoglobin: 12.5 g/dL (ref 12.0–15.0)
Hemoglobin: 11.5 g/dL — ABNORMAL LOW (ref 12.0–15.0)
LYMPHS ABS: 1.9 10*3/uL (ref 0.7–4.0)
Lymphocytes Relative: 9 %
Lymphocytes Relative: 17 %
Lymphs Abs: 4.1 10*3/uL — ABNORMAL HIGH (ref 0.7–4.0)
MCH: 29.6 pg (ref 26.0–34.0)
MCH: 29.6 pg (ref 26.0–34.0)
MCHC: 31.9 g/dL (ref 30.0–36.0)
MCHC: 32.1 g/dL (ref 30.0–36.0)
MCV: 92.9 fL (ref 78.0–100.0)
MCV: 92.3 fL (ref 78.0–100.0)
MONO ABS: 1.5 10*3/uL — AB (ref 0.1–1.0)
MONOS PCT: 7 %
MONOS PCT: 6 %
Monocytes Absolute: 1.5 10*3/uL — ABNORMAL HIGH (ref 0.1–1.0)
NEUTROS ABS: 19.3 10*3/uL — AB (ref 1.7–7.7)
Neutro Abs: 17.7 10*3/uL — ABNORMAL HIGH (ref 1.7–7.7)
Neutrophils Relative %: 84 %
Neutrophils Relative %: 77 %
Platelets: 340 10*3/uL (ref 150–400)
Platelets: 303 10*3/uL (ref 150–400)
RBC: 4.22 MIL/uL (ref 3.87–5.11)
RBC: 3.88 MIL/uL (ref 3.87–5.11)
RDW: 15.3 % (ref 11.5–15.5)
RDW: 15.4 % (ref 11.5–15.5)
WBC: 21.1 10*3/uL — AB (ref 4.0–10.5)
WBC: 25 10*3/uL — ABNORMAL HIGH (ref 4.0–10.5)

## 2017-05-24 LAB — COMPREHENSIVE METABOLIC PANEL
ALBUMIN: 1.9 g/dL — AB (ref 3.5–5.0)
ALT: 280 U/L — AB (ref 14–54)
ALT: 307 U/L — ABNORMAL HIGH (ref 14–54)
ANION GAP: 11 (ref 5–15)
AST: 352 U/L — AB (ref 15–41)
AST: 413 U/L — ABNORMAL HIGH (ref 15–41)
Albumin: 2.2 g/dL — ABNORMAL LOW (ref 3.5–5.0)
Alkaline Phosphatase: 127 U/L — ABNORMAL HIGH (ref 38–126)
Alkaline Phosphatase: 96 U/L (ref 38–126)
Anion gap: 18 — ABNORMAL HIGH (ref 5–15)
BILIRUBIN TOTAL: 1.2 mg/dL (ref 0.3–1.2)
BUN: 76 mg/dL — AB (ref 6–20)
BUN: 65 mg/dL — ABNORMAL HIGH (ref 6–20)
CHLORIDE: 109 mmol/L (ref 101–111)
CO2: 25 mmol/L (ref 22–32)
CO2: 24 mmol/L (ref 22–32)
Calcium: 8.1 mg/dL — ABNORMAL LOW (ref 8.9–10.3)
Calcium: 6.9 mg/dL — ABNORMAL LOW (ref 8.9–10.3)
Chloride: 114 mmol/L — ABNORMAL HIGH (ref 101–111)
Creatinine, Ser: 2.54 mg/dL — ABNORMAL HIGH (ref 0.44–1.00)
Creatinine, Ser: 1.75 mg/dL — ABNORMAL HIGH (ref 0.44–1.00)
GFR calc Af Amer: 18 mL/min — ABNORMAL LOW (ref >60)
GFR calc Af Amer: 29 mL/min — ABNORMAL LOW (ref >60)
GFR, EST NON AFRICAN AMERICAN: 16 mL/min — AB (ref >60)
GFR, EST NON AFRICAN AMERICAN: 25 mL/min — AB (ref >60)
GLUCOSE: 167 mg/dL — AB (ref 65–99)
Glucose, Bld: 139 mg/dL — ABNORMAL HIGH (ref 65–99)
POTASSIUM: 3.4 mmol/L — AB (ref 3.5–5.1)
POTASSIUM: 2.9 mmol/L — AB (ref 3.5–5.1)
Sodium: 152 mmol/L — ABNORMAL HIGH (ref 135–145)
Sodium: 149 mmol/L — ABNORMAL HIGH (ref 135–145)
TOTAL PROTEIN: 5.1 g/dL — AB (ref 6.5–8.1)
Total Bilirubin: 1 mg/dL (ref 0.3–1.2)
Total Protein: 6.3 g/dL — ABNORMAL LOW (ref 6.5–8.1)

## 2017-05-24 LAB — MRSA PCR SCREENING: MRSA by PCR: NEGATIVE

## 2017-05-24 LAB — I-STAT CG4 LACTIC ACID, ED
LACTIC ACID, VENOUS: 8.06 mmol/L — AB (ref 0.5–1.9)
Lactic Acid, Venous: 2.34 mmol/L (ref 0.5–1.9)

## 2017-05-24 LAB — LACTIC ACID, PLASMA
LACTIC ACID, VENOUS: 1.8 mmol/L (ref 0.5–1.9)
LACTIC ACID, VENOUS: 1.4 mmol/L (ref 0.5–1.9)

## 2017-05-24 LAB — RAPID STREP SCREEN (MED CTR MEBANE ONLY): Streptococcus, Group A Screen (Direct): NEGATIVE

## 2017-05-24 LAB — PROCALCITONIN: PROCALCITONIN: 27.62 ng/mL

## 2017-05-24 MED ORDER — MIRTAZAPINE 15 MG PO TABS: 30.0000 mg | ORAL_TABLET | Freq: Two times a day (BID) | ORAL | Status: DC | PRN

## 2017-05-24 MED ORDER — QUETIAPINE FUMARATE 50 MG PO TABS: 25.0000 mg | ORAL_TABLET | Freq: Every day | ORAL | Status: DC

## 2017-05-24 MED ORDER — POTASSIUM CHLORIDE 10 MEQ/100ML IV SOLN
10.0000 meq | INTRAVENOUS | Status: AC
  Administered 2017-05-24 (×2): 10 meq via INTRAVENOUS
  Filled 2017-05-24 (×2): qty 100

## 2017-05-24 MED ORDER — DIVALPROEX SODIUM 125 MG PO CSDR
125.0000 mg | DELAYED_RELEASE_CAPSULE | Freq: Every day | ORAL | Status: DC
  Administered 2017-05-25: 125 mg via ORAL
  Filled 2017-05-24: qty 1

## 2017-05-24 MED ORDER — LACTATED RINGERS IV BOLUS (SEPSIS)
1000.0000 mL | Freq: Once | INTRAVENOUS | Status: AC
  Administered 2017-05-24: 1000 mL via INTRAVENOUS

## 2017-05-24 MED ORDER — SODIUM CHLORIDE 0.9 % IV SOLN
INTRAVENOUS | Status: DC
  Administered 2017-05-25: 05:00:00 via INTRAVENOUS

## 2017-05-24 MED ORDER — HYDROXYZINE HCL 25 MG PO TABS: 50.0000 mg | ORAL_TABLET | Freq: Four times a day (QID) | ORAL | Status: DC | PRN

## 2017-05-24 MED ORDER — MIRTAZAPINE 15 MG PO TABS: 30.0000 mg | ORAL_TABLET | Freq: Every evening | ORAL | Status: DC | PRN

## 2017-05-24 MED ORDER — ZINC OXIDE 20 % EX OINT
TOPICAL_OINTMENT | Freq: Three times a day (TID) | CUTANEOUS | Status: DC | PRN
  Filled 2017-05-24: qty 28.35

## 2017-05-24 MED ORDER — ONDANSETRON HCL 4 MG PO TABS: 4.0000 mg | ORAL_TABLET | Freq: Four times a day (QID) | ORAL | Status: DC | PRN

## 2017-05-24 MED ORDER — MAGNESIUM HYDROXIDE 400 MG/5ML PO SUSP
960.0000 mL | Freq: Once | ORAL | Status: AC
  Administered 2017-05-24: 960 mL via RECTAL
  Filled 2017-05-24: qty 473

## 2017-05-24 MED ORDER — LEVOTHYROXINE SODIUM 112 MCG PO TABS
112.0000 ug | ORAL_TABLET | Freq: Every day | ORAL | Status: DC
  Administered 2017-05-25 – 2017-05-26 (×2): 112 ug via ORAL
  Filled 2017-05-24 (×2): qty 1

## 2017-05-24 MED ORDER — PIPERACILLIN-TAZOBACTAM 3.375 G IVPB: 3.3750 g | Freq: Three times a day (TID) | INTRAVENOUS | Status: DC

## 2017-05-24 MED ORDER — PIPERACILLIN-TAZOBACTAM IN DEX 2-0.25 GM/50ML IV SOLN
2.2500 g | Freq: Four times a day (QID) | INTRAVENOUS | Status: DC
  Administered 2017-05-24 – 2017-05-26 (×6): 2.25 g via INTRAVENOUS
  Filled 2017-05-24 (×8): qty 50

## 2017-05-24 MED ORDER — QUETIAPINE FUMARATE 50 MG PO TABS: 25.0000 mg | ORAL_TABLET | ORAL | Status: DC

## 2017-05-24 MED ORDER — ENTERAL NUTRITION SUPPLIES MISC: 118.0000 mL | Freq: Three times a day (TID) | Status: DC

## 2017-05-24 MED ORDER — POTASSIUM CHLORIDE 10 MEQ/100ML IV SOLN
10.0000 meq | INTRAVENOUS | Status: AC
  Administered 2017-05-24: 10 meq via INTRAVENOUS
  Filled 2017-05-24: qty 100

## 2017-05-24 MED ORDER — HYDROXYZINE HCL 25 MG PO TABS: 50.0000 mg | ORAL_TABLET | Freq: Every day | ORAL | Status: DC

## 2017-05-24 MED ORDER — GLUCERNA SHAKE PO LIQD
237.0000 mL | Freq: Three times a day (TID) | ORAL | Status: DC
  Filled 2017-05-24 (×4): qty 237

## 2017-05-24 MED ORDER — MEMANTINE HCL ER 28 MG PO CP24
28.0000 mg | ORAL_CAPSULE | Freq: Every day | ORAL | Status: DC
  Administered 2017-05-25 – 2017-05-26 (×2): 28 mg via ORAL
  Filled 2017-05-24 (×2): qty 1

## 2017-05-24 MED ORDER — SODIUM CHLORIDE 0.9 % IV BOLUS (SEPSIS)
1000.0000 mL | Freq: Once | INTRAVENOUS | Status: AC
  Administered 2017-05-24: 1000 mL via INTRAVENOUS

## 2017-05-24 MED ORDER — SIMVASTATIN 10 MG PO TABS: 10.0000 mg | ORAL_TABLET | Freq: Every day | ORAL | Status: DC

## 2017-05-24 MED ORDER — GUAIFENESIN 100 MG/5ML PO SOLN
200.0000 mg | Freq: Four times a day (QID) | ORAL | Status: DC | PRN
  Administered 2017-05-26: 200 mg via ORAL
  Filled 2017-05-24 (×2): qty 10

## 2017-05-24 MED ORDER — SODIUM CHLORIDE 0.9 % IV BOLUS (SEPSIS)
1000.0000 mL | Freq: Once | INTRAVENOUS | Status: AC
Start: 2017-05-24 — End: 2017-05-24
  Administered 2017-05-24: 1000 mL via INTRAVENOUS

## 2017-05-24 MED ORDER — ACETAMINOPHEN 500 MG PO TABS: 500.0000 mg | ORAL_TABLET | ORAL | Status: DC | PRN

## 2017-05-24 MED ORDER — CLONAZEPAM 0.5 MG PO TABS
0.5000 mg | ORAL_TABLET | Freq: Two times a day (BID) | ORAL | Status: DC | PRN
  Administered 2017-05-25: 0.5 mg via ORAL
  Filled 2017-05-24 (×2): qty 1

## 2017-05-24 MED ORDER — CLONAZEPAM 0.5 MG PO TABS: 0.5000 mg | ORAL_TABLET | Freq: Every day | ORAL | Status: DC

## 2017-05-24 MED ORDER — PIPERACILLIN-TAZOBACTAM 3.375 G IVPB 30 MIN: 3.3750 g | Freq: Once | INTRAVENOUS | Status: DC

## 2017-05-24 MED ORDER — DEXTROMETHORPHAN-QUINIDINE 20-10 MG PO CAPS
1.0000 | ORAL_CAPSULE | Freq: Two times a day (BID) | ORAL | Status: DC
  Filled 2017-05-24: qty 1

## 2017-05-24 MED ORDER — MIRTAZAPINE 15 MG PO TABS: 30.0000 mg | ORAL_TABLET | Freq: Every day | ORAL | Status: DC

## 2017-05-24 MED ORDER — BAZA PROTECT EX CREA: 1.0000 "application " | TOPICAL_CREAM | CUTANEOUS | Status: DC

## 2017-05-24 MED ORDER — BACITRACIN-NEOMYCIN-POLYMYXIN 400-5-5000 EX OINT: 1.0000 "application " | TOPICAL_OINTMENT | CUTANEOUS | Status: DC | PRN

## 2017-05-24 MED ORDER — DONEPEZIL HCL 10 MG PO TABS
10.0000 mg | ORAL_TABLET | Freq: Two times a day (BID) | ORAL | Status: DC
  Administered 2017-05-24 – 2017-05-26 (×4): 10 mg via ORAL
  Filled 2017-05-24 (×4): qty 1

## 2017-05-24 MED ORDER — SODIUM CHLORIDE 0.9% FLUSH
3.0000 mL | Freq: Two times a day (BID) | INTRAVENOUS | Status: DC
  Administered 2017-05-25 – 2017-05-26 (×3): 3 mL via INTRAVENOUS

## 2017-05-24 MED ORDER — QUETIAPINE FUMARATE 50 MG PO TABS: 50.0000 mg | ORAL_TABLET | Freq: Every day | ORAL | Status: DC

## 2017-05-24 MED ORDER — PIPERACILLIN-TAZOBACTAM 3.375 G IVPB 30 MIN
3.3750 g | Freq: Once | INTRAVENOUS | Status: AC
  Administered 2017-05-24: 3.375 g via INTRAVENOUS
  Filled 2017-05-24: qty 50

## 2017-05-24 MED ORDER — ONDANSETRON HCL 4 MG/2ML IJ SOLN
4.0000 mg | Freq: Four times a day (QID) | INTRAMUSCULAR | Status: DC | PRN
  Administered 2017-05-25: 4 mg via INTRAVENOUS
  Filled 2017-05-24: qty 2

## 2017-05-24 MED ORDER — SODIUM CHLORIDE 0.9 % IV BOLUS (SEPSIS)
1000.0000 mL | Freq: Once | INTRAVENOUS | Status: DC
  Administered 2017-05-24: 1000 mL via INTRAVENOUS

## 2017-05-24 MED ORDER — VANCOMYCIN HCL IN DEXTROSE 1-5 GM/200ML-% IV SOLN
1000.0000 mg | Freq: Once | INTRAVENOUS | Status: AC
  Administered 2017-05-24: 1000 mg via INTRAVENOUS
  Filled 2017-05-24: qty 200

## 2017-05-24 NOTE — Progress Notes (Signed)
Pharmacy Antibiotic Note  Erika Whitney is a 83 y.o. female admitted on 05/12/2017 with intra-abdominal infection.  Pharmacy has been consulted for zosyn dosing.  Today, 05/14/2017  Renal: AKI  WBC elevated  LA elevated  Plan: Zosyn 2.25gm IV q6h based on current renal function Follow renal function    Temp (24hrs), Avg:99.6 F (37.6 C), Min:99.6 F (37.6 C), Max:99.6 F (37.6 C)  Recent Labs  Lab 05/19/2017 1037 05/01/2017 1051 04/30/2017 1347  WBC 21.1*  --   --   CREATININE 2.54*  --   --   LATICACIDVEN  --  8.06* 2.34*    CrCl cannot be calculated (Unknown ideal weight.).    Allergies  Allergen Reactions  . Ibuprofen     Makes blood pressure go up    Antimicrobials this admission: 11/26 vanco x1 11/26 zosyn >>  Dose adjustments this admission:   Microbiology results: 11/26 BCx:  11/26 UCx:   11/26 Grp A strep cx:   Thank you for allowing pharmacy to be a part of this patient's care.  Clovis Riley 05/19/2017 4:00 PM

## 2017-05-24 NOTE — ED Provider Notes (Signed)
[facility] DEPT Provider Note   CSN: [id number] Arrival date & time: 05/20/2017  1016     History   Chief Complaint Chief Complaint  Patient presents with  . AMS    HPI Erika Whitney is a 83 y.o. female.  HPI Patient presents to the emergency room for evaluation of altered mental status.  Patient has a history of dementia and resides in a nursing facility.  The history is obtained from the patient's daughter as well as the EMS notes.  EMS states the nursing facility found the patient sitting in her chair not responding to staff this morning.  They noted that her heart rate was also elevated.  According to the patient's daughter, patient seem to be in her usual health over the weekend.  She did visit with grandchildren recently and later on the family found out that the children did have strep throat.  Patient herself appears very weak and will try to answer my questions somewhat but is unable to tell me what is bothering her.  She will try to tell me her name but otherwise does not provide any history. Past Medical History:  Diagnosis Date  . Arthritis   . Cancer (HCC)    BREAST  . Dementia   . Rectal prolapse     Patient Active Problem List   Diagnosis Date Noted  . Laceration of head 02/24/2017  . Essential hypertension 02/24/2017  . Subdural hematoma ([place])   . Rectal prolapse 06/28/2013  . Vasovagal syncope 06/28/2013  . GI bleed 06/27/2013  . Rectal bleed 06/27/2013  . Dyslipidemia 06/27/2013  . Hypothyroidism 06/27/2013  . Anxiety and depression 06/27/2013  . Dementia 06/27/2013  . Neuropathic pain 06/27/2013    Past Surgical History:  Procedure Laterality Date  . ABDOMINAL HYSTERECTOMY    . APPENDECTOMY    . CESAREAN SECTION    . COLONOSCOPY N/A 06/28/2013   Procedure: COLONOSCOPY;  Surgeon: Beryle Beams, MD;  Location: [facility] ENDOSCOPY;  Service: Endoscopy;  Laterality: N/A;  . LAPAROSCOPIC SIGMOID COLECTOMY N/A 06/30/2013   Procedure: LAPAROSCOPIC RECTOPEXY WITH SIGMOID COLECTOMY;  Surgeon: Leighton Ruff, MD;  Location: [facility] ORS;  Service: General;  Laterality: N/A;  . LEFT MASTECTOMY    . MASTECTOMY      OB History    No data available       Home Medications    Prior to Admission medications   Medication Sig Start Date End Date Taking? Authorizing Provider  acetaminophen (TYLENOL) 500 MG tablet Take 500 mg by mouth every 4 (four) hours as needed for headache.   Yes [provider]  alum & mag hydroxide-simeth ([place]) 200-200-20 MG/5ML suspension Take 30 mLs by mouth every 6 (six) hours as needed for indigestion or heartburn.   Yes [provider]  clonazePAM (KLONOPIN) 0.5 MG tablet Take 0.5 mg by mouth at bedtime.    Yes [provider]  Dextromethorphan-Quinidine (NUEDEXTA) 20-10 MG CAPS Take 1 capsule by mouth every 12 (twelve) hours.   Yes [provider]  divalproex (DEPAKOTE SPRINKLE) 125 MG capsule Take 125 mg by mouth daily.   Yes [provider]  donepezil (ARICEPT) 10 MG tablet Take 10 mg by mouth 2 (two) times daily.    Yes [provider]  Enteral Nutrition Supplies MISC Take 118 mLs by mouth 3 (three) times daily.   Yes [provider]  guaifenesin (ROBITUSSIN) 100 MG/5ML syrup Take 200 mg by mouth 4 (four) times daily as needed for  cough or congestion.   Yes [provider]  hydrochlorothiazide (HYDRODIURIL) 12.5 MG tablet Take 25 mg by mouth daily.   Yes [provider]  hydrOXYzine (ATARAX/VISTARIL) 25 MG tablet Take 50 mg by mouth daily.    Yes [provider]  loperamide (IMODIUM A-D) 2 MG tablet Take 2 mg by mouth 4 (four) times daily as needed for diarrhea or loose stools.   Yes [provider]  magnesium hydroxide (MILK OF MAGNESIA) 400 MG/5ML suspension Take 30 mLs by mouth at bedtime as needed for mild constipation.   Yes [provider]  MELATONIN PO Take 10 mg by mouth at  bedtime.    Yes [provider]  memantine (NAMENDA XR) 28 MG CP24 24 hr capsule Take 28 mg by mouth daily.   Yes [provider]  mirtazapine (REMERON) 30 MG tablet Take 30 mg by mouth at bedtime.   Yes [provider]  neomycin-bacitracin-polymyxin (NEOSPORIN) ointment Apply 1 application topically as needed for wound care (for skin tear abrasions). apply to eye   Yes [provider]  QUEtiapine (SEROQUEL) 25 MG tablet Take 25-50 mg by mouth as directed. Take 1 tablet in the morning and 2 tablets in the evening   Yes [provider]  SANTYL ointment  04/27/17  Yes [provider]  simvastatin (ZOCOR) 10 MG tablet Take 10 mg by mouth at bedtime.    Yes [provider]  Skin Protectants, Misc. (DIMETHICONE-ZINC OXIDE) cream Apply 1 application topically as directed. After each diaper change   Yes [provider]  SYNTHROID 112 MCG tablet Take 112 mcg by mouth daily before breakfast.  05/28/2017  Yes [provider]  dextromethorphan-guaiFENesin (TUSSIN DM) 10-100 MG/5ML liquid Take 5 mLs by mouth every 6 (six) hours as needed. Patient taking differently: Take 10 mLs by mouth every 6 (six) hours as needed for cough.  05/26/15   Carmin Muskrat, MD    Family History History reviewed. No pertinent family history.  Social History Social History   Tobacco Use  . Smoking status: Never Smoker  . Smokeless tobacco: Never Used  Substance Use Topics  . Alcohol use: No  . Drug use: No     Allergies   Ibuprofen   Review of Systems Review of Systems  All other systems reviewed and are negative.    Physical Exam Updated Vital Signs BP (!) 102/52   Pulse 90   Temp 99.6 F (37.6 C) (Oral)   Resp (!) 25   SpO2 93%   Physical Exam  Constitutional: She appears lethargic. No distress.  Elderly, frail  HENT:  Head: Normocephalic and atraumatic.  Right Ear: External ear normal.  Left Ear: External ear  normal.  Mouth/Throat: Mucous membranes are dry.  Eyes: Conjunctivae are normal. Right eye exhibits no discharge. Left eye exhibits no discharge. No scleral icterus.  Neck: Neck supple. No tracheal deviation present.  Cardiovascular: Normal rate, regular rhythm and intact distal pulses.  Pulmonary/Chest: Effort normal and breath sounds normal. No stridor. No respiratory distress. She has no wheezes. She has no rales.  Abdominal: Soft. Bowel sounds are normal. She exhibits no distension. There is no tenderness. There is no rebound and no guarding.  Musculoskeletal: She exhibits no edema or tenderness.  Neurological: She appears lethargic. No cranial nerve deficit (no facial droop, extraocular movements intact, no slurred speech) or sensory deficit. She exhibits normal muscle tone. She displays no seizure activity. Coordination normal. GCS eye subscore is 3. GCS  verbal subscore is 4. GCS motor subscore is 6.  Generalized weakness, patient is able to wiggle her toes and her hands but is unable to lift her legs or her arms off the bed, she attempts to answer my questions when I speak with her but is only able to say 1 or 2 words  Skin: Skin is warm and dry. No rash noted. She is not diaphoretic.  Psychiatric: She has a normal mood and affect.  Nursing note and vitals reviewed.    ED Treatments / Results  Labs (all labs ordered are listed, but only abnormal results are displayed) Labs Reviewed  COMPREHENSIVE METABOLIC PANEL - Abnormal; Notable for the following components:      Result Value   Sodium 152 (*)    Potassium 3.4 (*)    Glucose, Bld 167 (*)    BUN 76 (*)    Creatinine, Ser 2.54 (*)    Calcium 8.1 (*)    Total Protein 6.3 (*)    Albumin 2.2 (*)    AST 352 (*)    ALT 280 (*)    Alkaline Phosphatase 127 (*)    GFR calc non Af Amer 16 (*)    GFR calc Af Amer 18 (*)    Anion gap 18 (*)    All other components within normal limits  CBC WITH DIFFERENTIAL/PLATELET - Abnormal;  Notable for the following components:   WBC 21.1 (*)    Neutro Abs 17.7 (*)    Monocytes Absolute 1.5 (*)    All other components within normal limits  URINALYSIS, COMPLETE (UACMP) WITH MICROSCOPIC - Abnormal; Notable for the following components:   Color, Urine AMBER (*)    APPearance HAZY (*)    Bilirubin Urine SMALL (*)    Bacteria, UA RARE (*)    All other components within normal limits  I-STAT CG4 LACTIC ACID, ED - Abnormal; Notable for the following components:   Lactic Acid, Venous 8.06 (*)    All other components within normal limits  I-STAT CG4 LACTIC ACID, ED - Abnormal; Notable for the following components:   Lactic Acid, Venous 2.34 (*)    All other components within normal limits  RAPID STREP SCREEN (NOT AT [facility])  URINE CULTURE  CULTURE, BLOOD (ROUTINE X 2)  CULTURE, BLOOD (ROUTINE X 2)  CULTURE, GROUP A STREP ([place])  CBG MONITORING, ED  I-STAT CG4 LACTIC ACID, ED  I-STAT CG4 LACTIC ACID, ED    EKG  EKG Interpretation  Date/Time:  Monday May 24 2017 10:33:15 EST Ventricular Rate:  115 PR Interval:    QRS Duration: 84 QT Interval:  352 QTC Calculation: 487 R Axis:   -69 Text Interpretation:  Sinus tachycardia Atrial premature complexes Inferior infarct, old Consider anterior infarct Since last tracing rate faster Confirmed by Dorie Rank [id number]) on 05/28/2017 10:45:57 AM       Radiology Ct Abdomen Pelvis Wo Contrast  Result Date: 05/01/2017 CLINICAL DATA:  Abdominal pain. Fever. Abscess suspected. Recently on antibiotics. EXAM: CT ABDOMEN AND PELVIS WITHOUT CONTRAST TECHNIQUE: Multidetector CT imaging of the abdomen and pelvis was performed following the standard protocol without IV contrast. COMPARISON:  07/05/2013 plain films. FINDINGS: Lower chest: Mild motion degradation. Cardiomegaly, without pericardial or pleural effusion. Mild bilateral pleural thickening. Left breast implant. Hepatobiliary: Normal liver. 1.9 cm gallstone with mild gallbladder  distension. No specific evidence of acute cholecystitis. Mild motion degradation throughout the abdomen. No biliary duct dilatation. Pancreas: Normal pancreas for age. Spleen: Normal in size, without  focal abnormality. Adrenals/Urinary Tract: Normal adrenal glands. Expected renal cortical thinning for age. Bilateral extrarenal pelves. Normal bladder. Stomach/Bowel: Normal stomach, without wall thickening. 9.5 cm stool ball within the rectum. Surgical sutures within the rectum. There is pericolonic edema surrounding the rectum and sigmoid, including on image 57/series 2. Stool is seen throughout the more proximal colon. Normal terminal ileum. Appendix not visualized. Upper normal small bowel caliber, including proximally at 3.3 cm on image 20/series 2. Vascular/Lymphatic: Advanced aortic and branch vessel atherosclerosis. Splenic artery aneurysm of 11 mm on image 20/series 2. No abdominopelvic adenopathy. Reproductive: Hysterectomy.  No adnexal mass. Other: No free intraperitoneal air. No free fluid. Fat containing ventral abdominal wall hernia is small. Musculoskeletal: Osteopenia. Tarlov cysts. L1-L3 mild superior endplate compression deformities. IMPRESSION: 1. Findings most consistent with fecal impaction and likely secondary colitis/proctitis. 2. Borderline small bowel distension is likely due to adynamic ileus. Low-grade partial small bowel obstruction is felt less likely. 3. Fat containing ventral abdominal wall hernia. 4. Limitations including motion and lack of IV contrast. 5.  Aortic Atherosclerosis (ICD10-I70.0). 6. Cholelithiasis with nonspecific gallbladder distension. Correlate with right upper quadrant symptoms and possibly dedicated ultrasound. 7. 11 mm splenic artery aneurysm. Electronically Signed   By: Abigail Miyamoto M.D.   On: 05/07/2017 13:59   Dg Chest 1 View  Result Date: 05/02/2017 CLINICAL DATA:  Altered mental status. EXAM: CHEST 1 VIEW COMPARISON:  Right humerus x-rays dated April 30, 2017. Chest x-ray dated May 18, 2016. FINDINGS: The cardiomediastinal silhouette remains at the upper limits of normal in size. Atherosclerotic calcification of the aortic arch. Normal pulmonary vascularity. Hazy density projecting over the left lower lung may reflect overlying soft tissue versus a small layering pleural effusion. Left basilar atelectasis. No consolidation or pneumothorax. Unchanged right proximal humerus fracture. IMPRESSION: 1. Hazy density projecting over the left lower lung may reflect overlying soft tissue versus a small layering pleural effusion. 2. Unchanged right proximal humerus fracture. Electronically Signed   By: Titus Dubin M.D.   On: 05/14/2017 11:35   Ct Head Wo Contrast  Result Date: 05/19/2017 CLINICAL DATA:  Altered level of consciousness. History of subdural hematoma EXAM: CT HEAD WITHOUT CONTRAST TECHNIQUE: Contiguous axial images were obtained from the base of the skull through the vertex without intravenous contrast. COMPARISON:  CT head 04/07/2017, 02/24/2017 FINDINGS: Brain: Interval resolution of extra-axial fluid collections on the right. Negative for acute hemorrhage, infarct, mass. Generalized atrophy with chronic microvascular ischemia in the white matter. Vascular: Negative for hyperdense vessel Skull: Negative Sinuses/Orbits: Bilateral ocular surgery. No orbital mass. Paranasal sinuses clear Other: None IMPRESSION: Atrophy and chronic microvascular ischemia. No acute abnormality. Interval resolution of previous identified subdural fluid on the right. Electronically Signed   By: Franchot Gallo M.D.   On: 05/08/2017 11:26    Procedures .Critical Care Performed by: Dorie Rank, MD Authorized by: Dorie Rank, MD   Critical care provider statement:    Critical care time (minutes):  50   Critical care was necessary to treat or prevent imminent or life-threatening deterioration of the following conditions:  Sepsis   Critical care was time spent  personally by me on the following activities:  Discussions with consultants, evaluation of patient's response to treatment, examination of patient, ordering and performing treatments and interventions, ordering and review of laboratory studies, ordering and review of radiographic studies, pulse oximetry, re-evaluation of patient's condition, obtaining history from patient or surrogate and review of old charts   (including critical care time)  Medications  Ordered in ED Medications  sodium chloride 0.9 % bolus 1,000 mL (0 mLs Intravenous Stopped 05/02/2017 1239)    And  sodium chloride 0.9 % bolus 1,000 mL (0 mLs Intravenous Stopped 05/03/2017 1337)  piperacillin-tazobactam (ZOSYN) IVPB 3.375 g (0 g Intravenous Stopped 05/15/2017 1154)  vancomycin (VANCOCIN) IVPB 1000 mg/200 mL premix (0 mg Intravenous Stopped 05/07/2017 1337)  lactated ringers bolus 1,000 mL (1,000 mLs Intravenous New Bag/Given 05/10/2017 1456)     Initial Impression / Assessment and Plan / ED Course  I have reviewed the triage vital signs and the nursing notes.  Pertinent labs & imaging results that were available during my care of the patient were reviewed by me and considered in my medical decision making (see chart for details).  Clinical Course as of May 24 1532  Mon May 24, 2017  1044 Afebrile.  Not hypotensive.  Pt is tachycardic.  Appears dehydrated.  Will check labs and give fluids.  Discussed case with pt's daughter who will come to the ED.  [JK]  1102 Lactic acid elevated.  Code sepsis activated.  Will proceed with IV antibiotics and fluids.  Continue to monitor closely  [JK]  1405 Lactic acid level is improving although latest BP is lower and Pulse rate has increased. Will continue with fluid boluses.  Consult with PCCM  [YP]  9509 CT scan findings reviewed.  Possible cholecystitis.  Also concerned about possible proctitis, colitis.  Will consult with general surgery although not likely a surgical candidate.  [JK]  1418  Heart rate is down to the 90s again.  I suspect she may be having epsidoes of a fib.  Heart rate looked irregular on the monitor.  Confirmed DNR status with daughter.  [JK]  3267 D/w Will.  Gen Surgery. Will see pt in consultation  [JK]  1500 Discussed with critical care.  Dr Sabra Heck.  Critical care will consult.  Recommends medical admission.  [JK]    Clinical Course User Index [JK] Dorie Rank, MD    Patient presented to the emergency room with a presentation concerning for sepsis.  Source seems to suggest the possibility of acute gallbladder disease versus colitis.  Patient was started on IV fluids as well as empiric antibiotics.  She has had transient episodes of tachycardia although I believe these appear to be atrial fibrillation based on the monitor.  Most recent heart rate is down into the 90s.  Her blood pressures improved to 102/52.  We will continue with IV hydration.  I have consulted with critical care and general surgery.  I will consult the medical service for admission  Final Clinical Impressions(s) / ED Diagnoses   Final diagnoses:  Sepsis, due to unspecified organism ([place])  Acute kidney injury ([place])  Elevated LFTs  Dehydration     Dorie Rank, MD 05/26/2017 1533

## 2017-05-24 NOTE — ED Triage Notes (Signed)
Per EMS-states nursing home doesn't know the last time she was at baseline mentally-maybe sometime over the weekend-states she was found sitting in chair not responding to staff and/or EMS-states elevated HR-CBG 123, BP 126/54-500 cc Bolus given in route-was on antibiotics a week ago-has a fractured shoulder from 11/2-patient responsive to Bolus-VS improved and patient able to answer simple questions-patient placed on non-rebreather

## 2017-05-24 NOTE — ED Notes (Signed)
Assigned 1227 @ 15:50 call report @ 14:10

## 2017-05-24 NOTE — Consult Note (Signed)
Name: Erika Whitney MRN: [id number] DOB: 1925-05-13    ADMISSION DATE:  05/03/2017 CONSULTATION DATE: 11/26  REFERRING MD :  Dr Charlies Silvers, Dr Dorie Rank  CHIEF COMPLAINT:  sepsis  BRIEF PATIENT DESCRIPTION: 83 yr old low BP, constipation  SIGNIFICANT EVENTS  11/26 - admit, low BP, sepsis protocol started, surgical consult  STUDIES:  11/26- CT abdo>>> stool impaction, gallbladder distention, stone, no peri fluid   HISTORY OF PRESENT ILLNESS:   83 y.o. female with known hx of rectal prolapse with GI bleeding, s/p lap rectopexy with sigmoid colectomy 06/30/2013 by Dr. Marcello Moores, HLD, HTN, hypothyroidism, presented from Memory care unit to ED via EMS as she was found unresponsive. She was found to have low BP and fluids were provided, code sepsis was called. ABX were provided, CT scan showed stone gallbladder and impaction stool colon. She was also found to have a lactic 8 then dropped rapid with fluids. Also found to have improved neurostatus with fluids. DNR is established. NO pressors were needed in ED. LFT were elevated Family had not see her since pre Thanksgiving  PAST MEDICAL HISTORY :   has a past medical history of Arthritis, Cancer ([place]), Dementia, and Rectal prolapse.  has a past surgical history that includes Abdominal hysterectomy; Cesarean section; Appendectomy; LEFT MASTECTOMY; Mastectomy; Colonoscopy (N/A, 06/28/2013); and Laparoscopic sigmoid colectomy (N/A, 06/30/2013). Prior to Admission medications   Medication Sig Start Date End Date Taking? Authorizing Provider  acetaminophen (TYLENOL) 500 MG tablet Take 500 mg by mouth every 4 (four) hours as needed for headache.   Yes [provider]  alum & mag hydroxide-simeth ([place]) 200-200-20 MG/5ML suspension Take 30 mLs by mouth every 6 (six) hours as needed for indigestion or heartburn.   Yes [provider]  clonazePAM (KLONOPIN) 0.5 MG tablet Take 0.5 mg by mouth at bedtime.    Yes [provider]    Dextromethorphan-Quinidine (NUEDEXTA) 20-10 MG CAPS Take 1 capsule by mouth every 12 (twelve) hours.   Yes [provider]  divalproex (DEPAKOTE SPRINKLE) 125 MG capsule Take 125 mg by mouth daily.   Yes [provider]  donepezil (ARICEPT) 10 MG tablet Take 10 mg by mouth 2 (two) times daily.    Yes [provider]  Enteral Nutrition Supplies MISC Take 118 mLs by mouth 3 (three) times daily.   Yes [provider]  guaifenesin (ROBITUSSIN) 100 MG/5ML syrup Take 200 mg by mouth 4 (four) times daily as needed for cough or congestion.   Yes [provider]  hydrochlorothiazide (HYDRODIURIL) 12.5 MG tablet Take 25 mg by mouth daily.   Yes [provider]  hydrOXYzine (ATARAX/VISTARIL) 25 MG tablet Take 50 mg by mouth daily.    Yes [provider]  loperamide (IMODIUM A-D) 2 MG tablet Take 2 mg by mouth 4 (four) times daily as needed for diarrhea or loose stools.   Yes [provider]  magnesium hydroxide (MILK OF MAGNESIA) 400 MG/5ML suspension Take 30 mLs by mouth at bedtime as needed for mild constipation.   Yes [provider]  MELATONIN PO Take 10 mg by mouth at bedtime.    Yes [provider]  memantine (NAMENDA XR) 28 MG CP24 24 hr capsule Take 28 mg by mouth daily.   Yes [provider]  mirtazapine (REMERON) 30 MG tablet Take 30 mg by mouth at bedtime.   Yes [provider]  neomycin-bacitracin-polymyxin (NEOSPORIN) ointment Apply 1 application topically as needed for wound care (for skin tear  abrasions). apply to eye   Yes [provider]  QUEtiapine (SEROQUEL) 25 MG tablet Take 25-50 mg by mouth as directed. Take 1 tablet in the morning and 2 tablets in the evening   Yes [provider]  SANTYL ointment  04/27/17  Yes [provider]  simvastatin (ZOCOR) 10 MG tablet Take 10 mg by mouth at bedtime.    Yes [provider]  Skin Protectants, Misc.  (DIMETHICONE-ZINC OXIDE) cream Apply 1 application topically as directed. After each diaper change   Yes [provider]  SYNTHROID 112 MCG tablet Take 112 mcg by mouth daily before breakfast.  05/10/2017  Yes [provider]  dextromethorphan-guaiFENesin (TUSSIN DM) 10-100 MG/5ML liquid Take 5 mLs by mouth every 6 (six) hours as needed. Patient taking differently: Take 10 mLs by mouth every 6 (six) hours as needed for cough.  05/26/15   Carmin Muskrat, MD   Allergies  Allergen Reactions  . Ibuprofen     Makes blood pressure go up    FAMILY HISTORY:  family history is not on file. SOCIAL HISTORY:  reports that  has never smoked. she has never used smokeless tobacco. She reports that she does not drink alcohol or use drugs.  REVIEW OF SYSTEMS:   UNable, MS poor  SUBJECTIVE:   VITAL SIGNS: Temp:  [99.6 F (37.6 C)-100.1 F (37.8 C)] 100.1 F (37.8 C) (11/26 2022) Pulse Rate:  [82-135] 82 (11/26 1600) Resp:  [18-26] 24 (11/26 1706) BP: (85-118)/(44-69) 110/44 (11/26 1706) SpO2:  [91 %-97 %] 94 % (11/26 1706)  PHYSICAL EXAMINATION: General:  More awakening IN ROOM in SDU Neuro:  [place], not fc, mocing all etx HEENT:  NO JVD even flat Cardiovascular:   s1 s2 RRR Lungs:  CTA Abdomen:  Soft, no r/ g, BS low, no rt upper quad pain Musculoskeletal:  No edema Skin:  No rash  Recent Labs  Lab 05/21/2017 1037 05/08/2017 1727  NA 152* 149*  K 3.4* 2.9*  CL 109 114*  CO2 25 24  BUN 76* 65*  CREATININE 2.54* 1.75*  GLUCOSE 167* 139*   Recent Labs  Lab 05/03/2017 1037 05/22/2017 1727  HGB 12.5 11.5*  HCT 39.2 35.8*  WBC 21.1* 25.0*  PLT 340 303   Ct Abdomen Pelvis Wo Contrast  Result Date: 05/10/2017 CLINICAL DATA:  Abdominal pain. Fever. Abscess suspected. Recently on antibiotics. EXAM: CT ABDOMEN AND PELVIS WITHOUT CONTRAST TECHNIQUE: Multidetector CT imaging of the abdomen and pelvis was performed following the standard protocol without IV contrast.  COMPARISON:  07/05/2013 plain films. FINDINGS: Lower chest: Mild motion degradation. Cardiomegaly, without pericardial or pleural effusion. Mild bilateral pleural thickening. Left breast implant. Hepatobiliary: Normal liver. 1.9 cm gallstone with mild gallbladder distension. No specific evidence of acute cholecystitis. Mild motion degradation throughout the abdomen. No biliary duct dilatation. Pancreas: Normal pancreas for age. Spleen: Normal in size, without focal abnormality. Adrenals/Urinary Tract: Normal adrenal glands. Expected renal cortical thinning for age. Bilateral extrarenal pelves. Normal bladder. Stomach/Bowel: Normal stomach, without wall thickening. 9.5 cm stool ball within the rectum. Surgical sutures within the rectum. There is pericolonic edema surrounding the rectum and sigmoid, including on image 57/series 2. Stool is seen throughout the more proximal colon. Normal terminal ileum. Appendix not visualized. Upper normal small bowel caliber, including proximally at 3.3 cm on image 20/series 2. Vascular/Lymphatic: Advanced aortic and branch vessel atherosclerosis. Splenic artery aneurysm of 11 mm on image 20/series 2. No abdominopelvic adenopathy. Reproductive: Hysterectomy.  No adnexal mass. Other:  No free intraperitoneal air. No free fluid. Fat containing ventral abdominal wall hernia is small. Musculoskeletal: Osteopenia. Tarlov cysts. L1-L3 mild superior endplate compression deformities. IMPRESSION: 1. Findings most consistent with fecal impaction and likely secondary colitis/proctitis. 2. Borderline small bowel distension is likely due to adynamic ileus. Low-grade partial small bowel obstruction is felt less likely. 3. Fat containing ventral abdominal wall hernia. 4. Limitations including motion and lack of IV contrast. 5.  Aortic Atherosclerosis (ICD10-I70.0). 6. Cholelithiasis with nonspecific gallbladder distension. Correlate with right upper quadrant symptoms and possibly dedicated  ultrasound. 7. 11 mm splenic artery aneurysm. Electronically Signed   By: Abigail Miyamoto M.D.   On: 05/19/2017 13:59   Dg Chest 1 View  Result Date: 05/13/2017 CLINICAL DATA:  Altered mental status. EXAM: CHEST 1 VIEW COMPARISON:  Right humerus x-rays dated April 30, 2017. Chest x-ray dated May 18, 2016. FINDINGS: The cardiomediastinal silhouette remains at the upper limits of normal in size. Atherosclerotic calcification of the aortic arch. Normal pulmonary vascularity. Hazy density projecting over the left lower lung may reflect overlying soft tissue versus a small layering pleural effusion. Left basilar atelectasis. No consolidation or pneumothorax. Unchanged right proximal humerus fracture. IMPRESSION: 1. Hazy density projecting over the left lower lung may reflect overlying soft tissue versus a small layering pleural effusion. 2. Unchanged right proximal humerus fracture. Electronically Signed   By: Titus Dubin M.D.   On: 05/14/2017 11:35   Ct Head Wo Contrast  Result Date: 05/14/2017 CLINICAL DATA:  Altered level of consciousness. History of subdural hematoma EXAM: CT HEAD WITHOUT CONTRAST TECHNIQUE: Contiguous axial images were obtained from the base of the skull through the vertex without intravenous contrast. COMPARISON:  CT head 04/07/2017, 02/24/2017 FINDINGS: Brain: Interval resolution of extra-axial fluid collections on the right. Negative for acute hemorrhage, infarct, mass. Generalized atrophy with chronic microvascular ischemia in the white matter. Vascular: Negative for hyperdense vessel Skull: Negative Sinuses/Orbits: Bilateral ocular surgery. No orbital mass. Paranasal sinuses clear Other: None IMPRESSION: Atrophy and chronic microvascular ischemia. No acute abnormality. Interval resolution of previous identified subdural fluid on the right. Electronically Signed   By: Franchot Gallo M.D.   On: 05/23/2017 11:26    ASSESSMENT / PLAN:  Hypovolemia Rule out sepsis,   Likely Lactic acidosis- hypovolemia Constipation, obstipation Unlikely acute cholesystitis Hypernatremia LFT elevation Rule out colitis ARF, ATN, hypovolemia,pre renal  -she appears overwhelmingly hypovolemic -this is likely combination of hypovolemia and sepsis syndrome -source is likely GI, colitis from constipation and distention? -I am not impressed for acute chole, no fluid sourrounding and no upper quad pain on examination -agree with Vanc zosyn -consider Gi panel if diarhea -Surgery is assessing and dissimpacting and using enema -she is DNR< she does not require pressors -reassuring to see lactic resolving -Continued resus with a few liters saline then to 1/2 NS, chem follow up -she does not require ICU care -she does noto require central access now -follow urine outut -she appears plenty dry and can tolerate fluids for a few liters now -if no progress would assess RUQ [place] and correlate this with CT -repeat LFT with alk phos, consider assess amy, lipase -UA and pcxr neg for infiltrate, pcxr on LLL was deceiving , CT did not reveal infiltrate -agree with PCT assessments x 3 days   Lavon Paganini. Titus Mould, MD, [place] Pgr: [facility]  Pulmonary and [place] Pager: [phone]  05/12/2017, 8:27 PM

## 2017-05-24 NOTE — H&P (Addendum)
History and Physical    Erika Whitney DOB: 03/09/1925 DOA: 05/28/2017  Referring MD/NP/PA: Dr. Tomi Bamberger   PCP: Aletha Halim., PA-C   Patient coming from: home   Chief Complaint:   HPI: Erika Whitney is a 83 y.o. female with known hx of rectal prolapse with GI bleeding, s/p lap rectopexy with sigmoid colectomy 06/30/2013 by Dr. Marcello Moores, HLD, HTN, hypothyroidism, presented from Memory care unit to ED via EMS as she was found unresponsive. Please note that family not available at the time of the visit and most of the information is obtained from available records and ED doctor. Pt was apparently in her usual state of health last week but her family has not seen her since 05/20/2017. Due to demential, pt is unable to provide history.   ED Course:  In ED, pt was calm, NAD, T 99.6 F, HR up to 130's, SBP in 80's, blood work notable for Na 152, K 3.4, Cr 2.54, WBC 21K, lactic acid 8.06. CT abd with without contrast with fecal impaction. Surgery team consulted for assistance. TRH asked to admit to SDU.   Review of Systems:  Unable to obtain due to AMS  Past Medical History:  Diagnosis Date  . Arthritis   . Cancer ([facility])    BREAST  . Dementia   . Rectal prolapse     Past Surgical History:  Procedure Laterality Date  . ABDOMINAL HYSTERECTOMY    . APPENDECTOMY    . CESAREAN SECTION    . COLONOSCOPY N/A 06/28/2013   Procedure: COLONOSCOPY;  Surgeon: Beryle Beams, MD;  Location: [facility];  Service: Endoscopy;  Laterality: N/A;  . LAPAROSCOPIC SIGMOID COLECTOMY N/A 06/30/2013   Procedure: LAPAROSCOPIC RECTOPEXY WITH SIGMOID COLECTOMY;  Surgeon: Leighton Ruff, MD;  Location: [facility];  Service: General;  Laterality: N/A;  . LEFT MASTECTOMY    . MASTECTOMY      Social history: Unable to obtain due to AMS  Allergies  Allergen Reactions  . Ibuprofen     Makes blood pressure go up    Family history: Hypertension in mother  Prior to Admission medications     Medication Sig Start Date End Date Taking? Authorizing Provider  clonazePAM (KLONOPIN) 0.5 MG tablet Take 0.5 mg by mouth at bedtime.    Yes [provider]  divalproex (DEPAKOTE SPRINKLE) 125 MG capsule Take 125 mg by mouth daily.   Yes [provider]  donepezil (ARICEPT) 10 MG tablet Take 10 mg by mouth 2 (two) times daily.    Yes [provider]  hydrochlorothiazide (HYDRODIURIL) 12.5 MG tablet Take 25 mg by mouth daily.   Yes [provider]  hydrOXYzine (ATARAX/VISTARIL) 25 MG tablet Take 50 mg by mouth daily.    Yes [provider]  memantine (NAMENDA XR) 28 MG CP24 24 hr capsule Take 28 mg by mouth daily.   Yes [provider]  mirtazapine (REMERON) 30 MG tablet Take 30 mg by mouth at bedtime.   Yes [provider]  neomycin-bacitracin-polymyxin (NEOSPORIN) ointment Apply 1 application topically as needed for wound care (for skin tear abrasions). apply to eye   Yes [provider]  QUEtiapine (SEROQUEL) 25 MG tablet Take 25-50 mg by mouth as directed. Take 1 tablet in the morning and 2 tablets in the evening   Yes [provider]  SANTYL ointment  04/27/17  Yes [provider]  simvastatin (ZOCOR) 10 MG tablet Take 10 mg by mouth at bedtime.  Yes [provider]  SYNTHROID 112 MCG tablet Take 112 mcg by mouth daily before breakfast.  05/04/2017  Yes [provider]    Physical Exam: Vitals:   05/06/2017 1300 05/14/2017 1400 04/30/2017 1500 05/25/2017 1600  BP: (!) 89/49 (!) 85/69 (!) 102/52 (!) 101/49  Pulse: (!) 135 (!) 134 90 82  Resp: (!) 26 (!) 22 (!) 25 (!) 23  Temp:      TempSrc:      SpO2: 91% 95% 93% 92%    Constitutional: NAD, appears ill, confused Vitals:   05/19/2017 1300 05/20/2017 1400 05/14/2017 1500 05/04/2017 1600  BP: (!) 89/49 (!) 85/69 (!) 102/52 (!) 101/49  Pulse: (!) 135 (!) 134 90 82  Resp: (!) 26 (!) 22 (!) 25 (!) 23  Temp:      TempSrc:      SpO2: 91% 95% 93%  92%   Eyes: PERRL, lids and conjunctivae normal ENMT: dry mucus membranes, no tonsillar erythema   Neck: normal, supple, no masses, no thyromegaly Respiratory: clear to auscultation bilaterally, no wheezing, no crackles. Normal respiratory effort. No accessory muscle use.  Cardiovascular: tachycardic, appreciate S2, S2 Abdomen: no tenderness, no masses palpated. No hepatosplenomegaly. Bowel sounds positive.  Musculoskeletal: no clubbing / cyanosis. No joint deformity upper and lower extremities. Good ROM, no contractures. Normal muscle tone.  Skin: warm, dry Neurologic: No focal deficits, alert but confused  Psychiatric: Not agitated or restless    Labs on Admission: I have personally reviewed following labs and imaging studies  CBC: Recent Labs  Lab 05/04/2017 1037  WBC 21.1*  NEUTROABS 17.7*  HGB 12.5  HCT 39.2  MCV 92.9  PLT 782   Basic Metabolic Panel: Recent Labs  Lab 04/30/2017 1037  NA 152*  K 3.4*  CL 109  CO2 25  GLUCOSE 167*  BUN 76*  CREATININE 2.54*  CALCIUM 8.1*   Liver Function Tests: Recent Labs  Lab 05/07/2017 1037  AST 352*  ALT 280*  ALKPHOS 127*  BILITOT 1.0  PROT 6.3*  ALBUMIN 2.2*   Urine analysis:    Component Value Date/Time   COLORURINE AMBER (A) 05/23/2017 1405   APPEARANCEUR HAZY (A) 05/16/2017 1405   LABSPEC 1.020 05/01/2017 1405   PHURINE 5.0 05/28/2017 1405   GLUCOSEU NEGATIVE 05/03/2017 [place] 05/17/2017 1405   BILIRUBINUR SMALL (A) 05/23/2017 1405   [place] 05/07/2017 1405   PROTEINUR NEGATIVE 05/03/2017 1405   NITRITE NEGATIVE 05/15/2017 1405   LEUKOCYTESUR NEGATIVE 04/29/2017 1405   Recent Results (from the past 240 hour(s))  Rapid Strep Screen (Not at [facility])     Status: None   Collection Time: 05/01/2017 10:58 AM  Result Value Ref Range Status   Streptococcus, Group A Screen (Direct) NEGATIVE NEGATIVE Final    Comment: (NOTE) A Rapid Antigen test may result negative if the antigen level in  the sample is below the detection level of this test. The FDA has not cleared this test as a stand-alone test therefore the rapid antigen negative result has reflexed to a Group A Strep culture.     Radiological Exams on Admission: Ct Abdomen Pelvis Wo Contrast  Result Date: 05/21/2017 CLINICAL DATA:  Abdominal pain. Fever. Abscess suspected. Recently on antibiotics. EXAM: CT ABDOMEN AND PELVIS WITHOUT CONTRAST TECHNIQUE: Multidetector CT imaging of the abdomen and pelvis was performed following the standard protocol without IV contrast. COMPARISON:  07/05/2013 plain films. FINDINGS: Lower chest: Mild motion degradation. Cardiomegaly, without pericardial or pleural effusion. Mild bilateral  pleural thickening. Left breast implant. Hepatobiliary: Normal liver. 1.9 cm gallstone with mild gallbladder distension. No specific evidence of acute cholecystitis. Mild motion degradation throughout the abdomen. No biliary duct dilatation. Pancreas: Normal pancreas for age. Spleen: Normal in size, without focal abnormality. Adrenals/Urinary Tract: Normal adrenal glands. Expected renal cortical thinning for age. Bilateral extrarenal pelves. Normal bladder. Stomach/Bowel: Normal stomach, without wall thickening. 9.5 cm stool ball within the rectum. Surgical sutures within the rectum. There is pericolonic edema surrounding the rectum and sigmoid, including on image 57/series 2. Stool is seen throughout the more proximal colon. Normal terminal ileum. Appendix not visualized. Upper normal small bowel caliber, including proximally at 3.3 cm on image 20/series 2. Vascular/Lymphatic: Advanced aortic and branch vessel atherosclerosis. Splenic artery aneurysm of 11 mm on image 20/series 2. No abdominopelvic adenopathy. Reproductive: Hysterectomy.  No adnexal mass. Other: No free intraperitoneal air. No free fluid. Fat containing ventral abdominal wall hernia is small. Musculoskeletal: Osteopenia. Tarlov cysts. L1-L3 mild  superior endplate compression deformities. IMPRESSION: 1. Findings most consistent with fecal impaction and likely secondary colitis/proctitis. 2. Borderline small bowel distension is likely due to adynamic ileus. Low-grade partial small bowel obstruction is felt less likely. 3. Fat containing ventral abdominal wall hernia. 4. Limitations including motion and lack of IV contrast. 5.  Aortic Atherosclerosis (ICD10-I70.0). 6. Cholelithiasis with nonspecific gallbladder distension. Correlate with right upper quadrant symptoms and possibly dedicated ultrasound. 7. 11 mm splenic artery aneurysm. Electronically Signed   By: Abigail Miyamoto M.D.   On: 05/10/2017 13:59   Dg Chest 1 View  Result Date: 05/02/2017 CLINICAL DATA:  Altered mental status. EXAM: CHEST 1 VIEW COMPARISON:  Right humerus x-rays dated April 30, 2017. Chest x-ray dated May 18, 2016. FINDINGS: The cardiomediastinal silhouette remains at the upper limits of normal in size. Atherosclerotic calcification of the aortic arch. Normal pulmonary vascularity. Hazy density projecting over the left lower lung may reflect overlying soft tissue versus a small layering pleural effusion. Left basilar atelectasis. No consolidation or pneumothorax. Unchanged right proximal humerus fracture. IMPRESSION: 1. Hazy density projecting over the left lower lung may reflect overlying soft tissue versus a small layering pleural effusion. 2. Unchanged right proximal humerus fracture. Electronically Signed   By: Titus Dubin M.D.   On: 05/28/2017 11:35   Ct Head Wo Contrast  Result Date: 05/01/2017 CLINICAL DATA:  Altered level of consciousness. History of subdural hematoma EXAM: CT HEAD WITHOUT CONTRAST TECHNIQUE: Contiguous axial images were obtained from the base of the skull through the vertex without intravenous contrast. COMPARISON:  CT head 04/07/2017, 02/24/2017 FINDINGS: Brain: Interval resolution of extra-axial fluid collections on the right. Negative for  acute hemorrhage, infarct, mass. Generalized atrophy with chronic microvascular ischemia in the white matter. Vascular: Negative for hyperdense vessel Skull: Negative Sinuses/Orbits: Bilateral ocular surgery. No orbital mass. Paranasal sinuses clear Other: None IMPRESSION: Atrophy and chronic microvascular ischemia. No acute abnormality. Interval resolution of previous identified subdural fluid on the right. Electronically Signed   By: Franchot Gallo M.D.   On: 05/26/2017 11:26    EKG: atrial fibrillation  Assessment/Plan  Principal Problem: Sepsis / Colitis/Proctitis / Leukocytosis / Lactic acidosis - Sepsis criteria met on admission with tachycardia, tachypnea, hypoxia, hypotension, leukocytosis and lactic acidosis - CT scan showed fecal impaction and likely secondary colitis/proctitis. 2. Borderline small bowel distension is likely due to adynamic ileus. Low-grade partial small bowel obstruction is felt less likely.  - Lactic acid was 8.06 and then 2.77  - UA showed rare bacteria, no  leukocytes  - CXR showed hazy density projecting over the left lower lung may reflect overlying soft tissue versus a small layering pleural effusion.  - Blood cultures and urine culture ordered - Surgery and CCM consulted by EDP - Started broad spectrum antibiotic: zosyn for possible intra-abdominal source of infection   Active Problems: AKI superimposed on CKD stage 3 - Baseline Cr 1.18 - Cr on this admission 2.5, likely elevated due to sepsis - Continue IV fluids - Follow BMP in am  Hypernatremia - Due to dehydration - Continue IV fluids   Acute metabolic encephalopathy - Due to combination of dementia and sepsis - CT head without acute intracranial findings - hold seroquel and any sedating medications - allow clonazepam if needed   Atrial fibrillation - On initial telemetry in ED - suspect reactive from acute illness - Obtain 12 lead EKG - limited use of beta blockers or CCB due to  hypotension   Hypokalemia - Due to sepsis - Supplemented  - BMP in AM  Dementia without behavioral disturbance - Continue aricept and namenda  Depression and anxiety - Hold Seroquel until mental status clears  - allow clonazepam only if needed   Hypothyroidism - Continue synthroid   Dyslipidemia - hold statin due to transaminitis   DVT prophylaxis: SCD's Code Status: DNR/DNI Family Communication: no family at the bedside  Disposition Plan: admission to SDU Consults called: surgery, CCM   Disposition plan: Further plan will depend as patient's clinical course evolves and further radiologic and laboratory data become available.    At the time of admission, it appears that the appropriate admission status for this patient is INPATIENT .Thisis judged to be reasonable and necessary in order to provide the required intensity of service to ensure the patient's safetygiven the patient presentation of sepsis and possible colitis/proctitis in addition to physical exam findings, radiographic and laboratory data in the context of chronic comorbidities.   Time Spent  Critical care time spent : 65 minutes examining the patient, discussing with EDP, critical care physician, coordinating care and management.The medical decision making on this patient was of high complexity, the critically ill patient is at high risk for clinical deterioration, therefore this is a level 3 visit.    Leisa Lenz MD [facility] Pager [phone]  If 7PM-7AM, please contact night-coverage [email or web address] Password [facility]  05/25/2017, 4:35 PM

## 2017-05-24 NOTE — Consult Note (Signed)
Reason for Consult:  Sepsis with possible colitis/cholecystitis CC:  AMS Referring Physician: [name] is an 83 y.o. female.   HPI: Pt known to our service from rectal prolapse with GI bleeding, status post laparoscopic rectopexy with sigmoid colectomy 5/0/27 by Dr. Leighton Ruff.  She had a history of severe vasovagal syncope and hypotension and a CODE BLUE was called on 06/27/13 because of this.  She has hypertension dyslipidemia, hypothyroidism, history of dementia, anxiety and depression and prior history of breast cancer.  She was transferred from Memory care facility, assisted living to the ED today by EMS.  She was found nonresponsive.  CBG was 123 blood pressure was 126/54.  Her daughter reported she been in her usual state of health and was visiting with grandchildren over the weekend. No one from the family has seen her since 05/20/17 at which time she was conversant and enjoying grandchildren and great grandchildren.    Workup in the ED shows a temperature of 99.6.  She is tachycardic with heart rates up to 135.  Respiratory rate up to 26.  Blood pressure as low as 85/69.  Sats were 94-91% on room air.  Labs shows a sodium of 152, potassium 3.4.  Glucose of 167, BUN of 76, creatinine 2.54.  ALT is 280 AST is 08/01/1948 total bilirubin is 1 anion gap is 18.  WBC on admit was 21.1, hemoglobin 12.5, hematocrit 39.2.  Lactate on admission was 8.06 and improved with fluid hydration down to 2.34.  Urinalysis is unremarkable.  CT of the head shows atrophy and chronic microvascular ischemic changes.  No acute abnormality, interval resolution of a previously identified subdural fluid on the right.  Chest x-ray shows a density projecting over the left lower lung which might reflect soft tissue versus layering pleural effusion.  Right proximal humerus fracture.   CT scan of the abdomen and pelvis without contrast shows a normal liver.  A 1.9 cm gallstone with mild gallbladder distention no  specific evidence of acute cholecystitis.  The stomach is normal without thickening.  There is a 9.5 cm stool ball within the rectum.  Surgical sutures in the rectum with pericolonic edema surrounding the rectum and the sigmoid.  Stool is seen throughout the more proximal colon.  There is normal terminal ileum and the appendix was not visualized.  Upper normal small bowel caliber approximately 3.3 cm.  Findings were consistent with fecal impaction and secondary colitis/proctitis.  Borderline small bowel distention, there is a fat-containing ventral abdominal wall hernia.  Cholelithiasis with nonspecific gallbladder distention.  There is also an 11 mm splenic artery. We are asked to see.  Past Medical History:  Diagnosis Date  . Arthritis   . Cancer (HCC)    BREAST  . Dementia   . Rectal prolapse Hypothyroid Hypertension     Past Surgical History:  Procedure Laterality Date  . ABDOMINAL HYSTERECTOMY    . APPENDECTOMY    . CESAREAN SECTION    . COLONOSCOPY N/A 06/28/2013   Procedure: COLONOSCOPY;  Surgeon: Beryle Beams, MD;  Location: [facility];  Service: Endoscopy;  Laterality: N/A;  . LAPAROSCOPIC SIGMOID COLECTOMY N/A 06/30/2013   Procedure: LAPAROSCOPIC RECTOPEXY WITH SIGMOID COLECTOMY;  Surgeon: Leighton Ruff, MD;  Location: [facility];  Service: General;  Laterality: N/A;  . LEFT MASTECTOMY    . MASTECTOMY      History reviewed. No pertinent family history.  Social History:  reports that  has never smoked. she has never used smokeless  tobacco. She reports that she does not drink alcohol or use drugs.  Allergies:  Allergies  Allergen Reactions  . Ibuprofen     Makes blood pressure go up   Prior to Admission medications   Medication Sig Start Date End Date Taking? Authorizing Provider  acetaminophen (TYLENOL) 500 MG tablet Take 500 mg by mouth every 4 (four) hours as needed for headache.   Yes [provider]  alum & mag hydroxide-simeth ([place]) 200-200-20 MG/5ML  suspension Take 30 mLs by mouth every 6 (six) hours as needed for indigestion or heartburn.   Yes [provider]  clonazePAM (KLONOPIN) 0.5 MG tablet Take 0.5 mg by mouth at bedtime.    Yes [provider]  Dextromethorphan-Quinidine (NUEDEXTA) 20-10 MG CAPS Take 1 capsule by mouth every 12 (twelve) hours.   Yes [provider]  divalproex (DEPAKOTE SPRINKLE) 125 MG capsule Take 125 mg by mouth daily.   Yes [provider]  donepezil (ARICEPT) 10 MG tablet Take 10 mg by mouth 2 (two) times daily.    Yes [provider]  Enteral Nutrition Supplies MISC Take 118 mLs by mouth 3 (three) times daily.   Yes [provider]  guaifenesin (ROBITUSSIN) 100 MG/5ML syrup Take 200 mg by mouth 4 (four) times daily as needed for cough or congestion.   Yes [provider]  hydrochlorothiazide (HYDRODIURIL) 12.5 MG tablet Take 25 mg by mouth daily.   Yes [provider]  hydrOXYzine (ATARAX/VISTARIL) 25 MG tablet Take 50 mg by mouth daily.    Yes [provider]  loperamide (IMODIUM A-D) 2 MG tablet Take 2 mg by mouth 4 (four) times daily as needed for diarrhea or loose stools.   Yes [provider]  magnesium hydroxide (MILK OF MAGNESIA) 400 MG/5ML suspension Take 30 mLs by mouth at bedtime as needed for mild constipation.   Yes [provider]  MELATONIN PO Take 10 mg by mouth at bedtime.    Yes [provider]  memantine (NAMENDA XR) 28 MG CP24 24 hr capsule Take 28 mg by mouth daily.   Yes [provider]  mirtazapine (REMERON) 30 MG tablet Take 30 mg by mouth at bedtime.   Yes [provider]  neomycin-bacitracin-polymyxin (NEOSPORIN) ointment Apply 1 application topically as needed for wound care (for skin tear abrasions). apply to eye   Yes [provider]  QUEtiapine (SEROQUEL) 25 MG tablet Take 25-50 mg by mouth as directed. Take 1 tablet in the morning and 2 tablets in the  evening   Yes [provider]  SANTYL ointment  04/27/17  Yes [provider]  simvastatin (ZOCOR) 10 MG tablet Take 10 mg by mouth at bedtime.    Yes [provider]  Skin Protectants, Misc. (DIMETHICONE-ZINC OXIDE) cream Apply 1 application topically as directed. After each diaper change   Yes [provider]  SYNTHROID 112 MCG tablet Take 112 mcg by mouth daily before breakfast.  05/25/2017  Yes [provider]  dextromethorphan-guaiFENesin (TUSSIN DM) 10-100 MG/5ML liquid Take 5 mLs by mouth every 6 (six) hours as needed. Patient taking differently: Take 10 mLs by mouth every 6 (six) hours as needed for cough.  05/26/15   Carmin Muskrat, MD     Results for orders placed or performed during the hospital encounter of 05/12/2017 (from the past 48 hour(s))  Comprehensive metabolic panel     Status: Abnormal   Collection Time: 05/19/2017 10:37 AM  Result Value Ref Range  Sodium 152 (H) 135 - 145 mmol/L   Potassium 3.4 (L) 3.5 - 5.1 mmol/L   Chloride 109 101 - 111 mmol/L   CO2 25 22 - 32 mmol/L   Glucose, Bld 167 (H) 65 - 99 mg/dL   BUN 76 (H) 6 - 20 mg/dL   Creatinine, Ser 2.54 (H) 0.44 - 1.00 mg/dL   Calcium 8.1 (L) 8.9 - 10.3 mg/dL   Total Protein 6.3 (L) 6.5 - 8.1 g/dL   Albumin 2.2 (L) 3.5 - 5.0 g/dL   AST 352 (H) 15 - 41 U/L   ALT 280 (H) 14 - 54 U/L    Comment: RESULTS CONFIRMED BY MANUAL DILUTION   Alkaline Phosphatase 127 (H) 38 - 126 U/L   Total Bilirubin 1.0 0.3 - 1.2 mg/dL   GFR calc non Af Amer 16 (L) >60 mL/min   GFR calc Af Amer 18 (L) >60 mL/min    Comment: (NOTE) The eGFR has been calculated using the CKD EPI equation. This calculation has not been validated in all clinical situations. eGFR's persistently <60 mL/min signify possible Chronic Kidney Disease.    Anion gap 18 (H) 5 - 15  CBC WITH DIFFERENTIAL     Status: Abnormal   Collection Time: 05/08/2017 10:37 AM  Result Value Ref Range   WBC 21.1 (H) 4.0 - 10.5 K/uL    RBC 4.22 3.87 - 5.11 MIL/uL   Hemoglobin 12.5 12.0 - 15.0 g/dL   HCT 39.2 36.0 - 46.0 %   MCV 92.9 78.0 - 100.0 fL   MCH 29.6 26.0 - 34.0 pg   MCHC 31.9 30.0 - 36.0 g/dL   RDW 15.3 11.5 - 15.5 %   Platelets 340 150 - 400 K/uL   Neutrophils Relative % 84 %   Lymphocytes Relative 9 %   Monocytes Relative 7 %   Eosinophils Relative 0 %   Basophils Relative 0 %   Neutro Abs 17.7 (H) 1.7 - 7.7 K/uL   Lymphs Abs 1.9 0.7 - 4.0 K/uL   Monocytes Absolute 1.5 (H) 0.1 - 1.0 K/uL   Eosinophils Absolute 0.0 0.0 - 0.7 K/uL   Basophils Absolute 0.0 0.0 - 0.1 K/uL   WBC Morphology TOXIC GRANULATION   I-Stat CG4 Lactic Acid, ED     Status: Abnormal   Collection Time: 05/17/2017 10:51 AM  Result Value Ref Range   Lactic Acid, Venous 8.06 (HH) 0.5 - 1.9 mmol/L   Comment NOTIFIED PHYSICIAN   Rapid Strep Screen (Not at [facility])     Status: None   Collection Time: 05/10/2017 10:58 AM  Result Value Ref Range   Streptococcus, Group A Screen (Direct) NEGATIVE NEGATIVE    Comment: (NOTE) A Rapid Antigen test may result negative if the antigen level in the sample is below the detection level of this test. The FDA has not cleared this test as a stand-alone test therefore the rapid antigen negative result has reflexed to a Group A Strep culture.   I-Stat CG4 Lactic Acid, ED     Status: Abnormal   Collection Time: 05/23/2017  1:47 PM  Result Value Ref Range   Lactic Acid, Venous 2.34 (HH) 0.5 - 1.9 mmol/L   Comment NOTIFIED PHYSICIAN     Ct Abdomen Pelvis Wo Contrast  Result Date: 05/05/2017 CLINICAL DATA:  Abdominal pain. Fever. Abscess suspected. Recently on antibiotics. EXAM: CT ABDOMEN AND PELVIS WITHOUT CONTRAST TECHNIQUE: Multidetector CT imaging of the abdomen and pelvis was performed following the standard protocol without IV contrast. COMPARISON:  07/05/2013 plain films. FINDINGS: Lower chest: Mild motion degradation. Cardiomegaly, without pericardial or pleural effusion. Mild bilateral pleural  thickening. Left breast implant. Hepatobiliary: Normal liver. 1.9 cm gallstone with mild gallbladder distension. No specific evidence of acute cholecystitis. Mild motion degradation throughout the abdomen. No biliary duct dilatation. Pancreas: Normal pancreas for age. Spleen: Normal in size, without focal abnormality. Adrenals/Urinary Tract: Normal adrenal glands. Expected renal cortical thinning for age. Bilateral extrarenal pelves. Normal bladder. Stomach/Bowel: Normal stomach, without wall thickening. 9.5 cm stool ball within the rectum. Surgical sutures within the rectum. There is pericolonic edema surrounding the rectum and sigmoid, including on image 57/series 2. Stool is seen throughout the more proximal colon. Normal terminal ileum. Appendix not visualized. Upper normal small bowel caliber, including proximally at 3.3 cm on image 20/series 2. Vascular/Lymphatic: Advanced aortic and branch vessel atherosclerosis. Splenic artery aneurysm of 11 mm on image 20/series 2. No abdominopelvic adenopathy. Reproductive: Hysterectomy.  No adnexal mass. Other: No free intraperitoneal air. No free fluid. Fat containing ventral abdominal wall hernia is small. Musculoskeletal: Osteopenia. Tarlov cysts. L1-L3 mild superior endplate compression deformities. IMPRESSION: 1. Findings most consistent with fecal impaction and likely secondary colitis/proctitis. 2. Borderline small bowel distension is likely due to adynamic ileus. Low-grade partial small bowel obstruction is felt less likely. 3. Fat containing ventral abdominal wall hernia. 4. Limitations including motion and lack of IV contrast. 5.  Aortic Atherosclerosis (ICD10-I70.0). 6. Cholelithiasis with nonspecific gallbladder distension. Correlate with right upper quadrant symptoms and possibly dedicated ultrasound. 7. 11 mm splenic artery aneurysm. Electronically Signed   By: Abigail Miyamoto M.D.   On: 05/02/2017 13:59   Dg Chest 1 View  Result Date: 05/14/2017 CLINICAL  DATA:  Altered mental status. EXAM: CHEST 1 VIEW COMPARISON:  Right humerus x-rays dated April 30, 2017. Chest x-ray dated May 18, 2016. FINDINGS: The cardiomediastinal silhouette remains at the upper limits of normal in size. Atherosclerotic calcification of the aortic arch. Normal pulmonary vascularity. Hazy density projecting over the left lower lung may reflect overlying soft tissue versus a small layering pleural effusion. Left basilar atelectasis. No consolidation or pneumothorax. Unchanged right proximal humerus fracture. IMPRESSION: 1. Hazy density projecting over the left lower lung may reflect overlying soft tissue versus a small layering pleural effusion. 2. Unchanged right proximal humerus fracture. Electronically Signed   By: Titus Dubin M.D.   On: 05/19/2017 11:35   Ct Head Wo Contrast  Result Date: 05/05/2017 CLINICAL DATA:  Altered level of consciousness. History of subdural hematoma EXAM: CT HEAD WITHOUT CONTRAST TECHNIQUE: Contiguous axial images were obtained from the base of the skull through the vertex without intravenous contrast. COMPARISON:  CT head 04/07/2017, 02/24/2017 FINDINGS: Brain: Interval resolution of extra-axial fluid collections on the right. Negative for acute hemorrhage, infarct, mass. Generalized atrophy with chronic microvascular ischemia in the white matter. Vascular: Negative for hyperdense vessel Skull: Negative Sinuses/Orbits: Bilateral ocular surgery. No orbital mass. Paranasal sinuses clear Other: None IMPRESSION: Atrophy and chronic microvascular ischemia. No acute abnormality. Interval resolution of previous identified subdural fluid on the right. Electronically Signed   By: Franchot Gallo M.D.   On: 05/16/2017 11:26    Review of Systems  Unable to perform ROS: Dementia  Gastrointestinal: Positive for constipation (chronic constipation per her daughter.).   Blood pressure (!) 89/49, pulse (!) 135, temperature 99.6 F (37.6 C), temperature source  Oral, resp. rate (!) 26, SpO2 91 %. Physical Exam  Constitutional: No distress.  Chronically ill woman, who is will look and follow  you.  She responds and know her daughter, but cannot answer any other questions.    HENT:  Head: Normocephalic.  Mouth/Throat: No oropharyngeal exudate.  Mouth and tongue are dry  Eyes: Right eye exhibits no discharge. Left eye exhibits no discharge. No scleral icterus.  Pupils are equal, but I have to open her eyes to see them.  Neck: Normal range of motion. Neck supple. No JVD present. No tracheal deviation present. No thyromegaly present.  Cardiovascular: Regular rhythm, normal heart sounds and intact distal pulses.  Slightly tachycardic  Respiratory: Effort normal and breath sounds normal. No respiratory distress. She has no wheezes. She has no rales. She exhibits no tenderness.  Left breast implant.  GI: Soft. She exhibits distension. She exhibits no mass. There is tenderness (she is tender to palpation lower abdomen, more on right than left). There is no rebound and no guarding.  Lower abdomen mid line incision.  Musculoskeletal: She exhibits edema (trace).  Lymphadenopathy:    She has no cervical adenopathy.  Neurological:  Awake and alert, knows her daughter, cannot answer any other questions.  Skin: Skin is warm and dry. No rash noted. She is not diaphoretic. No erythema. There is pallor.  Psychiatric:  Appears sedated when you come in but wakes up and will try to talk with family.  She has no memory of where she is.     Anti-infectives (From admission, onward)   Start     Dose/Rate Route Frequency Ordered Stop   05/06/2017 1800  piperacillin-tazobactam (ZOSYN) IVPB 2.25 g     2.25 g 100 mL/hr over 30 Minutes Intravenous Every 6 hours 04/29/2017 1606     05/06/2017 1600  piperacillin-tazobactam (ZOSYN) IVPB 3.375 g  Status:  Discontinued     3.375 g 12.5 mL/hr over 240 Minutes Intravenous Every 8 hours 05/26/2017 1559 05/20/2017 1600   05/07/2017 1545   piperacillin-tazobactam (ZOSYN) IVPB 3.375 g  Status:  Discontinued     3.375 g 100 mL/hr over 30 Minutes Intravenous  Once 05/15/2017 1544 05/28/2017 1558   05/13/2017 1115  piperacillin-tazobactam (ZOSYN) IVPB 3.375 g     3.375 g 100 mL/hr over 30 Minutes Intravenous  Once 05/16/2017 1102 05/11/2017 1154   05/13/2017 1115  vancomycin (VANCOCIN) IVPB 1000 mg/200 mL premix     1,000 mg 200 mL/hr over 60 Minutes Intravenous  Once 05/05/2017 1102 05/28/2017 1337      Assessment/Plan: Altered mental status - non responsive  Dementia - Memory care Assisted living - current residence  Sepsis - elevated lactate, elevatedWBC, tachycardia,  Colitis/proctitis - most likely secondary to severe constipation/9.5 cm rectal stool ball Hx of chronic constipation Cholelithiasis - elevated  LFT's  Dehydration- Na 152 Acute kidney injury 11 mm splenic artery aneurysm Recent fall with fx  Proximal right humerus  FEN:  NPO/IV fluids ID:  Zosyn/Vancomycin 05/15/2017  DVT:  SCD Foley:  Wick in place Follow up:  TBD   Plan:  Agree with Medical admission, rehydration, antibiotics for now, Rx of constipation and huge stool collection in rectum.   Will follow with you.  She needs to be on a bowel regieme for her chronic constipation.    Taha Dimond 05/19/2017, 2:32 PM

## 2017-05-24 NOTE — Progress Notes (Signed)
A consult was received from an ED physician for vanc/Zosyn per pharmacy dosing.  The patient's profile has been reviewed for ht/wt/allergies/indication/available labs.   A one time order has been placed for vanc 1g and Zosyn 3.375g.  Further antibiotics/pharmacy consults should be ordered by admitting physician if indicated.                       Thank you, Kara Mead 05/20/2017  11:04 AM

## 2017-05-25 ENCOUNTER — Other Ambulatory Visit: Payer: Self-pay

## 2017-05-25 ENCOUNTER — Inpatient Hospital Stay (HOSPITAL_COMMUNITY): Payer: Medicare Other

## 2017-05-25 DIAGNOSIS — F0390 Unspecified dementia without behavioral disturbance: Secondary | ICD-10-CM

## 2017-05-25 DIAGNOSIS — E872 Acidosis: Secondary | ICD-10-CM

## 2017-05-25 DIAGNOSIS — L8990 Pressure ulcer of unspecified site, unspecified stage: Secondary | ICD-10-CM

## 2017-05-25 LAB — GLUCOSE, CAPILLARY
GLUCOSE-CAPILLARY: 210 mg/dL — AB (ref 65–99)
GLUCOSE-CAPILLARY: 194 mg/dL — AB (ref 65–99)
GLUCOSE-CAPILLARY: 98 mg/dL (ref 65–99)
Glucose-Capillary: 227 mg/dL — ABNORMAL HIGH (ref 65–99)
Glucose-Capillary: 173 mg/dL — ABNORMAL HIGH (ref 65–99)

## 2017-05-25 LAB — BASIC METABOLIC PANEL
Anion gap: 21 — ABNORMAL HIGH (ref 5–15)
BUN: 78 mg/dL — AB (ref 6–20)
CALCIUM: 7.9 mg/dL — AB (ref 8.9–10.3)
CO2: 19 mmol/L — ABNORMAL LOW (ref 22–32)
CREATININE: 2.68 mg/dL — AB (ref 0.44–1.00)
Chloride: 115 mmol/L — ABNORMAL HIGH (ref 101–111)
GFR calc Af Amer: 17 mL/min — ABNORMAL LOW (ref >60)
GFR, EST NON AFRICAN AMERICAN: 15 mL/min — AB (ref >60)
GLUCOSE: 184 mg/dL — AB (ref 65–99)
Potassium: 4.1 mmol/L (ref 3.5–5.1)
SODIUM: 155 mmol/L — AB (ref 135–145)

## 2017-05-25 LAB — CBC
HEMATOCRIT: 48.2 % — AB (ref 36.0–46.0)
HEMOGLOBIN: 14.9 g/dL (ref 12.0–15.0)
MCH: 29 pg (ref 26.0–34.0)
MCHC: 30.9 g/dL (ref 30.0–36.0)
MCV: 94 fL (ref 78.0–100.0)
Platelets: 284 10*3/uL (ref 150–400)
RBC: 5.13 MIL/uL — ABNORMAL HIGH (ref 3.87–5.11)
RDW: 15.2 % (ref 11.5–15.5)
WBC: 28.4 10*3/uL — ABNORMAL HIGH (ref 4.0–10.5)

## 2017-05-25 LAB — URINE CULTURE: CULTURE: NO GROWTH

## 2017-05-25 LAB — COMPREHENSIVE METABOLIC PANEL
ALBUMIN: 2.1 g/dL — AB (ref 3.5–5.0)
ALT: 306 U/L — ABNORMAL HIGH (ref 14–54)
ANION GAP: 15 (ref 5–15)
AST: 290 U/L — ABNORMAL HIGH (ref 15–41)
Alkaline Phosphatase: 117 U/L (ref 38–126)
BUN: 64 mg/dL — ABNORMAL HIGH (ref 6–20)
CO2: 22 mmol/L (ref 22–32)
Calcium: 7.8 mg/dL — ABNORMAL LOW (ref 8.9–10.3)
Chloride: 118 mmol/L — ABNORMAL HIGH (ref 101–111)
Creatinine, Ser: 1.88 mg/dL — ABNORMAL HIGH (ref 0.44–1.00)
GFR calc Af Amer: 26 mL/min — ABNORMAL LOW (ref >60)
GFR calc non Af Amer: 23 mL/min — ABNORMAL LOW (ref >60)
GLUCOSE: 155 mg/dL — AB (ref 65–99)
POTASSIUM: 4.6 mmol/L (ref 3.5–5.1)
SODIUM: 155 mmol/L — AB (ref 135–145)
Total Bilirubin: 1.1 mg/dL (ref 0.3–1.2)
Total Protein: 6.1 g/dL — ABNORMAL LOW (ref 6.5–8.1)

## 2017-05-25 MED ORDER — INSULIN ASPART 100 UNIT/ML ~~LOC~~ SOLN
0.0000 [IU] | SUBCUTANEOUS | Status: DC
  Administered 2017-05-25: 3 [IU] via SUBCUTANEOUS
  Administered 2017-05-25: 5 [IU] via SUBCUTANEOUS
  Administered 2017-05-25: 8 [IU] via SUBCUTANEOUS
  Administered 2017-05-25: 3 [IU] via SUBCUTANEOUS
  Administered 2017-05-26: 2 [IU] via SUBCUTANEOUS

## 2017-05-25 MED ORDER — PROCHLORPERAZINE EDISYLATE 5 MG/ML IJ SOLN
5.0000 mg | Freq: Once | INTRAMUSCULAR | Status: AC
  Administered 2017-05-25: 5 mg via INTRAVENOUS
  Filled 2017-05-25: qty 2

## 2017-05-25 MED ORDER — DEXTROSE 5 % IV SOLN
INTRAVENOUS | Status: DC
  Administered 2017-05-25 – 2017-05-26 (×2): via INTRAVENOUS

## 2017-05-25 MED ORDER — ACETAMINOPHEN 650 MG RE SUPP
650.0000 mg | Freq: Four times a day (QID) | RECTAL | Status: DC | PRN
  Administered 2017-05-25 – 2017-05-26 (×3): 650 mg via RECTAL
  Filled 2017-05-25 (×3): qty 1

## 2017-05-25 NOTE — Progress Notes (Signed)
Notified Baltazar Najjar NP that patients heart rate increased to 110-120, respirations increased to 35-50, Oxygen on at 3 liters per N/C. Clarified order for NG tube with Baltazar Najjar NP.

## 2017-05-25 NOTE — Progress Notes (Signed)
Initial Nutrition Assessment  DOCUMENTATION CODES:   (Will assess for malnutrition at follow-up, if able)  INTERVENTION:  - RD will monitor for POC/GOC based on medical course.   NUTRITION DIAGNOSIS:   Inadequate oral intake related to inability to eat as evidenced by NPO status.  GOAL:   Patient will meet greater than or equal to 90% of their needs  MONITOR:   Weight trends, Labs, Skin, I & O's, Other (Comment)(Medical course and associated GOC)  REASON FOR ASSESSMENT:   Malnutrition Screening Tool  ASSESSMENT:   83 y.o. female with known hx of rectal prolapse with GI bleeding, s/p lap rectopexy with sigmoid colectomy 06/30/2013 by Dr. Marcello Moores, HLD, HTN, hypothyroidism, presented from Memory care unit to ED via EMS as she was found unresponsive. She was found to have low BP and fluids were provided, code sepsis was called. ABX were provided, CT scan showed stone gallbladder and impaction stool colon. She was also found to have a lactic 8 then dropped rapid with fluids. Also found to have improved neurostatus with fluids. DNR is established.  Pt seen for MST. BMI indicates normal weight. Pt has been NPO since admission. NGT placed earlier this AM and RN reports that MD feels that pt may have aspirated. Approximately 450cc brown output in canister at this time. A daughter-in-law is at bedside and confirms that pt is from a facility and that she has dentures which are currently at a family member's home.   Reviewed PCCM NP note from this AM which states that pt is DNR/DNI and consideration of shift to comfort care should pt further decline. Note states pt with end-stage dementia.   Unable to perform NFPE at this time as pt sleeping and covered up to neck in blankets. Did not perform with respect to pt's comfort. Will attempt at follow-up if warranted at that time. Per chart review, weight has been stable over the past 1 week and is also consistent with weight in January 2015 but no other  weight hx available since that time.   Medications reviewed; sliding scale Novolog, 112 mcg oral Synthroid/day, SMOG enema given yesterday.  Labs reviewed; CBGs: 227 and 210 mg/dL today, Na: 155 mmol/L, Cl: 118 mmol/L, BUN: 64 mg/dL, creatinine: 1.88 mg/dL, Ca: 7.8 mg/dL, LFTs elevated, GFR: 23 mL/min.   IVF: D5 @ 75 mL/hr.     NUTRITION - FOCUSED PHYSICAL EXAM:  Unable to perform/assess at this time.     Diet Order:  Diet NPO time specified  EDUCATION NEEDS:   No education needs have been identified at this time  Skin:  Skin Assessment: Skin Integrity Issues: Skin Integrity Issues:: Stage I, Unstageable Stage I: L foot Unstageable: R foot  Last BM:  11/27  Height:   Ht Readings from Last 1 Encounters:  05/25/17 5\' 3"  (1.6 m)    Weight:   Wt Readings from Last 1 Encounters:  05/25/17 127 lb 6.8 oz (57.8 kg)    Ideal Body Weight:  52.27 kg  BMI:  Body mass index is 22.57 kg/m.  Estimated Nutritional Needs:   Kcal:  1270-1500  Protein:  50-60 grams  Fluid:  >/= 1.6 L/day     Jarome Matin, MS, RD, LDN, [facility] Clinical Dietitian Pager # [phone] After hours/weekend pager # [phone]

## 2017-05-25 NOTE — Progress Notes (Signed)
#   14 french NG to left nare tolerated procedure well. Placement per xray and  return of stomach content.

## 2017-05-25 NOTE — Progress Notes (Signed)
Progress Note: General Surgery Service   Assessment/Plan: Patient Active Problem List   Diagnosis Date Noted  . Pressure injury of skin 05/25/2017  . Leukocytosis 05/05/2017  . Hypokalemia 05/26/2017  . Lactic acidosis 04/29/2017  . Colitis 05/02/2017  . Sepsis ([place]) 05/05/2017  . Somnolence   . Elevated LFTs   . Dehydration   . Acute kidney injury ([place])   . Laceration of head 02/24/2017  . Essential hypertension 02/24/2017  . Subdural hematoma ([place])   . Rectal prolapse 06/28/2013  . Rectal bleed 06/27/2013  . Dyslipidemia 06/27/2013  . Hypothyroidism 06/27/2013  . Anxiety and depression 06/27/2013  . Dementia 06/27/2013  . Neuropathic pain 06/27/2013    multiple bowel movements after disimpaction and enema, NG placed for dilated loops of bowel with >1L output overnight, leukocytosis higher than yesterday despite large fluid resuscitation and antibiotics, lactic acidosis improved, Cr still rising, abdomen distended, liquid stool in bed -continue NG tube -[place] RUQ to further evaluate for cholecystitis -NPO -continue empiric abx    LOS: 1 day  Chief Complaint/Subjective: Multiple bowel movements overnight, NG placed for dilated loops  Objective: Vital signs in last 24 hours: Temp:  [99.1 F (37.3 C)-101 F (38.3 C)] 101 F (38.3 C) (11/27 0727) Pulse Rate:  [77-135] 112 (11/27 0700) Resp:  [18-49] 40 (11/27 0700) BP: (85-122)/(30-70) 102/30 (11/27 0700) SpO2:  [91 %-97 %] 97 % (11/27 0700) Last BM Date: (UTA)  Intake/Output from previous day: 11/26 0701 - 11/27 0700 In: 5956 [I.V.:500; IV Piggyback:5456] Out: 400 [Urine:400] Intake/Output this shift: No intake/output data recorded.   Abd: soft, distended  Extremities: trace edema  Neuro: eyes open, no verbal response to questions  Lab Results: CBC  Recent Labs    05/05/2017 1727 05/25/17 0337  WBC 25.0* 28.4*  HGB 11.5* 14.9  HCT 35.8* 48.2*  PLT 303 284   BMET Recent Labs    05/06/2017 1727  05/25/17 0337  NA 149* 155*  K 2.9* 4.6  CL 114* 118*  CO2 24 22  GLUCOSE 139* 155*  BUN 65* 64*  CREATININE 1.75* 1.88*  CALCIUM 6.9* 7.8*   PT/INR No results for input(s): LABPROT, INR in the last 72 hours. ABG No results for input(s): PHART, HCO3 in the last 72 hours.  Invalid input(s): PCO2, PO2  Studies/Results:  Anti-infectives: Anti-infectives (From admission, onward)   Start     Dose/Rate Route Frequency Ordered Stop   05/08/2017 1800  piperacillin-tazobactam (ZOSYN) IVPB 2.25 g     2.25 g 100 mL/hr over 30 Minutes Intravenous Every 6 hours 05/16/2017 1606     05/23/2017 1600  piperacillin-tazobactam (ZOSYN) IVPB 3.375 g  Status:  Discontinued     3.375 g 12.5 mL/hr over 240 Minutes Intravenous Every 8 hours 05/01/2017 1559 04/30/2017 1600   05/22/2017 1545  piperacillin-tazobactam (ZOSYN) IVPB 3.375 g  Status:  Discontinued     3.375 g 100 mL/hr over 30 Minutes Intravenous  Once 05/28/2017 1544 05/03/2017 1558   05/19/2017 1115  piperacillin-tazobactam (ZOSYN) IVPB 3.375 g     3.375 g 100 mL/hr over 30 Minutes Intravenous  Once 05/01/2017 1102 05/20/2017 1154   05/10/2017 1115  vancomycin (VANCOCIN) IVPB 1000 mg/200 mL premix     1,000 mg 200 mL/hr over 60 Minutes Intravenous  Once 05/08/2017 1102 05/08/2017 1337      Medications: Scheduled Meds: . divalproex  125 mg Oral Daily  . donepezil  10 mg Oral BID  . feeding supplement (GLUCERNA SHAKE)  237 mL Oral TID  BM  . insulin aspart  0-15 Units Subcutaneous Q4H  . levothyroxine  112 mcg Oral QAC breakfast  . memantine  28 mg Oral Daily  . sodium chloride flush  3 mL Intravenous Q12H   Continuous Infusions: . sodium chloride 75 mL/hr at 05/25/17 0525  . piperacillin-tazobactam (ZOSYN)  IV Stopped (05/25/17 0640)   PRN Meds:.acetaminophen, clonazePAM, guaiFENesin, mirtazapine, neomycin-bacitracin-polymyxin, ondansetron **OR** ondansetron (ZOFRAN) IV, zinc oxide  Mickeal Skinner, MD Pg# [phone] [facility]

## 2017-05-25 NOTE — Care Management Note (Signed)
Case Management Note  Patient Details  Name: Erika Whitney MRN: [id number] Date of Birth: 08-08-1924  Subjective/Objective:                  Sepsis protocol and ng tube   Action/Plan: Date: May 25, 2017 Velva Harman, BSN, [place], [place] Chart and notes review for patient progress and needs. Will follow for case management and discharge needs. Next review date: [id number]  Expected Discharge Date:  (unknown)               Expected Discharge Plan:  Home/Self Care  In-House Referral:     Discharge planning Services  CM Consult  Post Acute Care Choice:    Choice offered to:     DME Arranged:    DME Agency:     HH Arranged:    HH Agency:     Status of Service:  In process, will continue to follow  If discussed at Long Length of Stay Meetings, dates discussed:    Additional Comments:  Leeroy Cha, RN 05/25/2017, 8:22 AM

## 2017-05-25 NOTE — Progress Notes (Signed)
Patient ID: Erika Whitney, female   DOB: 04-Apr-1925, 83 y.o.   MRN: [id number]  PROGRESS NOTE    Erika Whitney  XTK:[id number] DOB: 03-Oct-1924 DOA: 05/28/2017  PCP: Aletha Halim., PA-C   Brief Narrative:  83 y.o. female with known hx of rectal prolapse with GI bleeding, s/p lap rectopexy with sigmoid colectomy 06/30/2013 by Dr. Marcello Moores, HLD, HTN, hypothyroidism, presented from Memory care unit to ED via EMS as she was found unresponsive. Pt was apparently in her usual state of health last week but her family has not seen her since 05/20/2017.   In ED, pt was calm, NAD, T 99.6 F, HR up to 130's, SBP in 80's, blood work notable for Na 152, K 3.4, Cr 2.54, WBC 21K, lactic acid 8.06. CT abd with without contrast with fecal impaction and secondary colitis/proctitis. Surgery team consulted for assistance.     Assessment & Plan:   Principal Problem: Sepsis / Colitis/Proctitis / Leukocytosis / Lactic acidosis - Sepsis criteria met on admission with tachycardia, tachypnea, hypoxia, hypotension, leukocytosis and lactic acidosis - CT scan showed fecal impaction and likely secondary colitis/proctitis. Borderline small bowel distension is likely due to adynamic ileus. Low-grade partial small bowel obstruction is felt less likely.  - Lactic acid was 8.06 and then 2.77  - UA showed rare bacteria, no leukocytes  - CXR showed hazy density projecting over the left lower lung may reflect overlying soft tissue versus a small layering pleural effusion.  - Blood cultures and urine culture ordered - Started broad spectrum antibiotic: zosyn for possible intra-abdominal source of infection  - Surgery has seen the pt in consultation and recommended bowel regimen and abx - Since admission she had multiple bowel movement after disimpaction and enema   Active Problems: Nausea and vomiting - Had N/V overnight - Abd x ray 11/26 showed increasing small bowel distention likely progressive ileus with large  stool burden - NG tube in place - [place] ordered for evaluation of cholecystitis - it showed cholelithiasis. No sonographic signs of acute cholecystitis or biliary dilatation.   AKI superimposed on CKD stage 3 - Baseline Cr 1.18 - Cr on this admission 2.5, likely elevated due to sepsis - Continue to monitor BMP daily  Hypernatremia - Due to dehydration - Continue IV fluids   Acute metabolic encephalopathy - Due to combination of dementia and sepsis - CT head without acute intracranial findings - Hold seroquel  Atrial fibrillation - On initial telemetry in ED - Limited use of BB or CCB due to soft BP  Hypokalemia - Due to sepsis - Supplemented   Dementia without behavioral disturbance - No significant changes in mental status   Depression and anxiety - Hold Seroquel due to mental status changes   Hypothyroidism - Continue synthroid   Dyslipidemia - Holding statin due to transaminitis     DVT prophylaxis: SCD's Code Status: DNR/DNI Family Communication: no family at the bedside this am Disposition Plan: remains in SDU due to critical condition    Consultants:   PCCM  Procedures:   NG tube placed   Antimicrobials:   Zosyn 11/26 -->    Subjective: Vomiting overnight 3 times.  Objective: Vitals:   05/25/17 1300 05/25/17 1400 05/25/17 1500 05/25/17 1525  BP: (!) 112/44 (!) 96/57 (!) 88/38   Pulse:      Resp: (!) 58 (!) 45 (!) 45   Temp:    98.6 F (37 C)  TempSrc:    Oral  SpO2:  Weight:      Height:        Intake/Output Summary (Last 24 hours) at 05/25/2017 1554 Last data filed at 05/25/2017 1300 Gross per 24 hour  Intake 1874.25 ml  Output 400 ml  Net 1474.25 ml   Filed Weights   05/25/17 1000  Weight: 57.8 kg (127 lb 6.8 oz)    Examination:  General exam: Appears calm and comfortable Respiratory system: Clear to auscultation. Respiratory effort normal. Cardiovascular system: S1 & S2 heard, tachycardic  Gastrointestinal  system: Abdomen is nondistended, NG tube in place Central nervous system: No focal deficits . Extremities: no tenderness, palpable pulses Skin: warm, dry  Psychiatry: Not agitated or restless   Data Reviewed: I have personally reviewed following labs and imaging studies  CBC: Recent Labs  Lab 05/16/2017 1037 05/25/2017 1727 05/25/17 0337  WBC 21.1* 25.0* 28.4*  NEUTROABS 17.7* 19.3*  --   HGB 12.5 11.5* 14.9  HCT 39.2 35.8* 48.2*  MCV 92.9 92.3 94.0  PLT 340 303 818   Basic Metabolic Panel: Recent Labs  Lab 05/17/2017 1037 05/13/2017 1727 05/25/17 0337  NA 152* 149* 155*  K 3.4* 2.9* 4.6  CL 109 114* 118*  CO2 _0 GLUCOSE 167* 139* 155*  BUN 76* 65* 64*  CREATININE 2.54* 1.75* 1.88*  CALCIUM 8.1* 6.9* 7.8*   GFR: Estimated Creatinine Clearance: 16.8 mL/min (A) (by C-G formula based on SCr of 1.88 mg/dL (H)). Liver Function Tests: Recent Labs  Lab 05/19/2017 1037 05/17/2017 1727 05/25/17 0337  AST 352* 413* 290*  ALT 280* 307* 306*  ALKPHOS 127* 96 117  BILITOT 1.0 1.2 1.1  PROT 6.3* 5.1* 6.1*  ALBUMIN 2.2* 1.9* 2.1*   No results for input(s): LIPASE, AMYLASE in the last 168 hours. No results for input(s): AMMONIA in the last 168 hours. Coagulation Profile: No results for input(s): INR, PROTIME in the last 168 hours. Cardiac Enzymes: No results for input(s): CKTOTAL, CKMB, CKMBINDEX, TROPONINI in the last 168 hours. BNP (last 3 results) No results for input(s): PROBNP in the last 8760 hours. HbA1C: No results for input(s): HGBA1C in the last 72 hours. CBG: Recent Labs  Lab 05/25/17 0724 05/25/17 1229 05/25/17 1526  GLUCAP 227* 210* 194*   Lipid Profile: No results for input(s): CHOL, HDL, LDLCALC, TRIG, CHOLHDL, LDLDIRECT in the last 72 hours. Thyroid Function Tests: No results for input(s): TSH, T4TOTAL, FREET4, T3FREE, THYROIDAB in the last 72 hours. Anemia Panel: No results for input(s): VITAMINB12, FOLATE, FERRITIN, TIBC, IRON, RETICCTPCT in the  last 72 hours. Urine analysis:    Component Value Date/Time   COLORURINE AMBER (A) 05/21/2017 1405   APPEARANCEUR HAZY (A) 05/21/2017 1405   LABSPEC 1.020 05/16/2017 1405   PHURINE 5.0 05/12/2017 1405   GLUCOSEU NEGATIVE 05/13/2017 1405   HGBUR NEGATIVE 05/18/2017 1405   BILIRUBINUR SMALL (A) 04/30/2017 1405   KETONESUR NEGATIVE 05/23/2017 1405   PROTEINUR NEGATIVE 05/26/2017 1405   NITRITE NEGATIVE 05/07/2017 1405   LEUKOCYTESUR NEGATIVE 05/23/2017 1405   Sepsis Labs: _1 (procalcitonin:4,lacticidven:4)   Rapid Strep Screen (Not at [facility])     Status: None   Collection Time: 05/04/2017 10:58 AM  Result Value Ref Range Status   Streptococcus, Group A Screen (Direct) NEGATIVE NEGATIVE Final  Culture, group A strep     Status: None (Preliminary result)   Collection Time: 05/12/2017 10:58 AM  Result Value Ref Range Status   Specimen Description THROAT  Final   Special Requests NONE Reflexed from [id number]  Final  Culture   Final    CULTURE REINCUBATED FOR BETTER GROWTH Performed at [facility], [place] [address], [place], [place] [address]    Report Status PENDING  Incomplete  Blood Culture (routine x 2)     Status: None (Preliminary result)   Collection Time: 05/11/2017 11:20 AM  Result Value Ref Range Status   Specimen Description BLOOD LEFT ANTECUBITAL  Final   Special Requests   Final    BOTTLES DRAWN AEROBIC AND ANAEROBIC Blood Culture adequate volume   Culture   Final    NO GROWTH 1 DAY    Report Status PENDING  Incomplete  Blood Culture (routine x 2)     Status: None (Preliminary result)   Collection Time: 05/18/2017 11:52 AM  Result Value Ref Range Status   Specimen Description BLOOD RIGHT ANTECUBITAL  Final   Special Requests   Final    BOTTLES DRAWN AEROBIC AND ANAEROBIC Blood Culture results may not be optimal due to an excessive volume of blood received in culture bottles   Culture   Final    NO GROWTH 1 DAY    Report Status PENDING  Incomplete  Urine  culture     Status: None   Collection Time: 05/04/2017  2:05 PM  Result Value Ref Range Status   Specimen Description URINE, CLEAN CATCH  Final   Special Requests NONE  Final   Culture   Final    NO GROWTH    Report Status 05/25/2017 FINAL  Final  MRSA PCR Screening     Status: None   Collection Time: 05/20/2017  5:09 PM  Result Value Ref Range Status   MRSA by PCR NEGATIVE NEGATIVE Final      Radiology Studies: Ct Abdomen Pelvis Wo Contrast  Result Date: 05/16/2017 CLINICAL DATA:  Abdominal pain. Fever. Abscess suspected. Recently on antibiotics. EXAM: CT ABDOMEN AND PELVIS WITHOUT CONTRAST TECHNIQUE: Multidetector CT imaging of the abdomen and pelvis was performed following the standard protocol without IV contrast. COMPARISON:  07/05/2013 plain films. FINDINGS: Lower chest: Mild motion degradation. Cardiomegaly, without pericardial or pleural effusion. Mild bilateral pleural thickening. Left breast implant. Hepatobiliary: Normal liver. 1.9 cm gallstone with mild gallbladder distension. No specific evidence of acute cholecystitis. Mild motion degradation throughout the abdomen. No biliary duct dilatation. Pancreas: Normal pancreas for age. Spleen: Normal in size, without focal abnormality. Adrenals/Urinary Tract: Normal adrenal glands. Expected renal cortical thinning for age. Bilateral extrarenal pelves. Normal bladder. Stomach/Bowel: Normal stomach, without wall thickening. 9.5 cm stool ball within the rectum. Surgical sutures within the rectum. There is pericolonic edema surrounding the rectum and sigmoid, including on image 57/series 2. Stool is seen throughout the more proximal colon. Normal terminal ileum. Appendix not visualized. Upper normal small bowel caliber, including proximally at 3.3 cm on image 20/series 2. Vascular/Lymphatic: Advanced aortic and branch vessel atherosclerosis. Splenic artery aneurysm of 11 mm on image 20/series 2. No abdominopelvic adenopathy. Reproductive:  Hysterectomy.  No adnexal mass. Other: No free intraperitoneal air. No free fluid. Fat containing ventral abdominal wall hernia is small. Musculoskeletal: Osteopenia. Tarlov cysts. L1-L3 mild superior endplate compression deformities. IMPRESSION: 1. Findings most consistent with fecal impaction and likely secondary colitis/proctitis. 2. Borderline small bowel distension is likely due to adynamic ileus. Low-grade partial small bowel obstruction is felt less likely. 3. Fat containing ventral abdominal wall hernia. 4. Limitations including motion and lack of IV contrast. 5.  Aortic Atherosclerosis (ICD10-I70.0). 6. Cholelithiasis with nonspecific gallbladder distension. Correlate with right upper quadrant symptoms and  possibly dedicated ultrasound. 7. 11 mm splenic artery aneurysm. Electronically Signed   By: Abigail Miyamoto M.D.   On: 05/09/2017 13:59   Dg Chest 1 View  Result Date: 05/10/2017 CLINICAL DATA:  Altered mental status. EXAM: CHEST 1 VIEW COMPARISON:  Right humerus x-rays dated April 30, 2017. Chest x-ray dated May 18, 2016. FINDINGS: The cardiomediastinal silhouette remains at the upper limits of normal in size. Atherosclerotic calcification of the aortic arch. Normal pulmonary vascularity. Hazy density projecting over the left lower lung may reflect overlying soft tissue versus a small layering pleural effusion. Left basilar atelectasis. No consolidation or pneumothorax. Unchanged right proximal humerus fracture. IMPRESSION: 1. Hazy density projecting over the left lower lung may reflect overlying soft tissue versus a small layering pleural effusion. 2. Unchanged right proximal humerus fracture. Electronically Signed   By: Titus Dubin M.D.   On: 05/19/2017 11:35   Dg Abd 1 View  Result Date: 05/25/2017 CLINICAL DATA:  Nasogastric tube placement EXAM: ABDOMEN - 1 VIEW COMPARISON:  Abdominal radiograph 05/25/2017 FINDINGS: Nasogastric tube tip and side-port overlie the gastric fundus.  Dilated loops of gas-filled small bowel are unchanged. IMPRESSION: NG tube tip and side port at the upper gastric fundus. Electronically Signed   By: Ulyses Jarred M.D.   On: 05/25/2017 05:34   Ct Head Wo Contrast  Result Date: 05/13/2017 CLINICAL DATA:  Altered level of consciousness. History of subdural hematoma EXAM: CT HEAD WITHOUT CONTRAST TECHNIQUE: Contiguous axial images were obtained from the base of the skull through the vertex without intravenous contrast. COMPARISON:  CT head 04/07/2017, 02/24/2017 FINDINGS: Brain: Interval resolution of extra-axial fluid collections on the right. Negative for acute hemorrhage, infarct, mass. Generalized atrophy with chronic microvascular ischemia in the white matter. Vascular: Negative for hyperdense vessel Skull: Negative Sinuses/Orbits: Bilateral ocular surgery. No orbital mass. Paranasal sinuses clear Other: None IMPRESSION: Atrophy and chronic microvascular ischemia. No acute abnormality. Interval resolution of previous identified subdural fluid on the right. Electronically Signed   By: Franchot Gallo M.D.   On: 05/18/2017 11:26   US Abdomen Limited  Result Date: 05/25/2017 CLINICAL DATA:  Abdominal pain.  Cholelithiasis. EXAM: ULTRASOUND ABDOMEN LIMITED RIGHT UPPER QUADRANT COMPARISON:  None. FINDINGS: Gallbladder: At least 1 2 cm calcified gallstone is seen. No evidence of gallbladder wall thickening or pericholecystic fluid. No sonographic Murphy sign noted by sonographer. Common bile duct: Diameter: 4 mm, within normal limits. Liver: No focal lesion identified. Within normal limits in parenchymal echogenicity. Portal vein is patent on color Doppler imaging with normal direction of blood flow towards the liver. IMPRESSION: Cholelithiasis. No sonographic signs of acute cholecystitis or biliary dilatation. Electronically Signed   By: Earle Gell M.D.   On: 05/25/2017 14:48   Dg Abd Portable 1v  Result Date: 05/25/2017 CLINICAL DATA:  Nausea and  vomiting. EXAM: PORTABLE ABDOMEN - 1 VIEW COMPARISON:  CT yesterday FINDINGS: Large rectal and descending colonic stool burden again seen. Slight increased gaseous distention of small bowel in the central abdomen measuring up to 3.8 cm. No evidence of free air on supine view. IMPRESSION: Increasing small bowel distention likely progressive ileus. Large descending and rectosigmoid stool burden with probable fecal impaction again seen. Electronically Signed   By: Jeb Levering M.D.   On: 05/25/2017 03:44        Scheduled Meds: . donepezil  10 mg Oral BID  . insulin aspart  0-15 Units Subcutaneous Q4H  . levothyroxine  112 mcg Oral QAC breakfast  . memantine  28 mg Oral Daily  . sodium chloride flush  3 mL Intravenous Q12H   Continuous Infusions: . dextrose 75 mL/hr at 05/25/17 1203  . piperacillin-tazobactam (ZOSYN)  IV Stopped (05/25/17 1234)     LOS: 1 day    Time spent: 25 minutes  Greater than 50% of the time spent on counseling and coordinating the care.   Leisa Lenz, MD [facility] Pager [phone]  If 7PM-7AM, please contact night-coverage [email or web address] Password [facility] 05/25/2017, 3:54 PM

## 2017-05-25 NOTE — Clinical Social Work Note (Addendum)
Clinical Social Work Assessment  Patient Details  Name: Erika Whitney MRN: [id number] Date of Birth: 1924/12/15  Date of referral:  05/25/17               Reason for consult:  Facility Placement                Permission sought to share information with:  Family Supports, Chartered certified accountant granted to share information::     Name::        Agency::  [facility]  Relationship::  Adult Aeronautical engineer Information:     Housing/Transportation Living arrangements for the past 2 months:  Assisted Living Facility([facility]) Source of Information:  Adult Children Patient Interpreter Needed:  None Criminal Activity/Legal Involvement Pertinent to Current Situation/Hospitalization:  No  Significant Relationships:  Adult Children Lives with:    Do you feel safe going back to the place where you live?  Yes Need for family participation in patient care:  Yes (Patient has dementia and dependent w/ mobility)  Care giving concerns:   Patient was found unresponsive and brought to ED via EMS.   The patient son and daughter are concern the patient will not be able to return to [facility] memory care unit.  Both report the patient has declined in health over the past two weeks and has had several falls in the last month. The patient severely injured herself, a broken arm and concussion.  Family reports the patient is needing more assistance at the facility she has difficulty feeding herself therefore has been dehydrated.  She reports the facility has recently transitioned the patient to a wheelchair for her safety.   The family is unsure if the patient will need Hospice vs. SNF  The family is concern the  patient will no be well enough to participate in physical therapy and medicare pay for rehab. The family feels the patient will need a higher level of care, possibly LTC placement. The family is concern how this will be paid since the patient does not make enough  to pay for SNF private pay, and has been working with DSS to apply for LTC medicaid.    Social Worker assessment / plan:  CSW met with the patient family at bedside. Patient has dementia and unable to particpate during assessment.    CSW  listened attentively to family concerns about the uncertainty of the patient disposition at this time.   CSW explain SNF process [facility] Care at [facility] and discussed private pay vs. Medicare vs. Medicaid requirements.   CSW provided them with a SNF list for reference and CSW information for additional questions.   Patient still in Intensive Care Unit  Plan: To be determine.   Employment status:    Insurance information:  Medicare PT Recommendations:  Not assessed at this time Information / Referral to community resources:     Patient/Family's Response to care:  Patient has dementia, therefore family is making decision about patient care. Family appreciative of CSW assistance and information about SNF process.   Patient/Family's Understanding of and Emotional Response to Diagnosis, Current Treatment, and Prognosis:  Patient family understand  the patient health is declining. They have recently learned about the patient diagnosis of sepsis and is trying to determine the best care for the patient at discharge.   Emotional Assessment Appearance:  Appears stated age Attitude/Demeanor/Rapport:  Unable to Assess Affect (typically observed):  Unable to Assess Orientation:    Alcohol / Substance use:  Not Applicable Psych involvement (Current and /or in the community):  No (Comment)  Discharge Needs  Concerns to be addressed:  Discharge Planning Concerns, Decision making concerns Readmission within the last 30 days:  Yes Current discharge risk:  Dependent with Mobility Barriers to Discharge:  Continued Medical Work up   [employer], LCSW 05/25/2017, 3:00 PM

## 2017-05-25 NOTE — Progress Notes (Signed)
Patient having liquid results from [facility] enema. Noted patient has vomited x 3 brown emesis.

## 2017-05-25 NOTE — Progress Notes (Signed)
Patient family requested to talk with CSW to discuss the patient disposition. No family at bedside. Per nurse family left and plans to return. CSW comeback at later time.   Kathrin Greathouse, Latanya Presser, MSW Clinical Social Worker  [phone] 05/25/2017  1:22 PM

## 2017-05-25 NOTE — Progress Notes (Signed)
Notified family member Erika Whitney of updates, with placement NG tube for nausea and vomiting.

## 2017-05-25 NOTE — Progress Notes (Signed)
Name: Erika Whitney MRN: [id number] DOB: [birth year]    ADMISSION DATE:  05/18/2017 CONSULTATION DATE: 11/26  REFERRING MD :  Dr Charlies Silvers, Dr Dorie Rank  CHIEF COMPLAINT:  sepsis  BRIEF PATIENT DESCRIPTION:  83 y.o. female with known hx of rectal prolapse with GI bleeding, s/p lap rectopexy with sigmoid colectomy 06/30/2013 by Dr. Marcello Moores, HLD, HTN, hypothyroidism, presented from Memory care unit to ED via EMS as she was found unresponsive. She was found to have low BP and fluids were provided, code sepsis was called. ABX were provided, CT scan showed stone gallbladder and impaction stool colon. She was also found to have a lactic 8 then dropped rapid with fluids. Also found to have improved neurostatus with fluids. DNR is established. NO pressors were needed in ED. LFT were elevated.  Family had not seen her since pre Thanksgiving   SUBJECTIVE: RN reports fever to 101 this am, down to 97.5 after.  5 BM's overnight.  Vomiting 3 times overnight > required NGT.   Family at bedside.   VITAL SIGNS: Temp:  [97.5 F (36.4 C)-101 F (38.3 C)] 97.5 F (36.4 C) (11/27 0906) Pulse Rate:  [77-135] 125 (11/27 1000) Resp:  [20-51] 44 (11/27 1000) BP: (85-122)/(30-86) 107/63 (11/27 1000) SpO2:  [91 %-98 %] 95 % (11/27 1000) Weight:  [127 lb 6.8 oz (57.8 kg)] 127 lb 6.8 oz (57.8 kg) (11/27 1000)  PHYSICAL EXAMINATION: General: frail elderly female lying in bed, tachypneic   HEENT: MM pink/dry, no jvd Neuro: eyes open, looks around, no response, moans occasionally  CV: s1s2 rrr, no m/r/g PULM: even/non-labored, lungs bilaterally clear  XB:LTJQ, non-tender, bsx4 active  Extremities: warm/dry, no edema, R arm in a sling Skin: no rashes or lesions   Recent Labs  Lab 05/02/2017 1037 05/25/2017 1727 05/25/17 0337  NA 152* 149* 155*  K 3.4* 2.9* 4.6  CL 109 114* 118*  CO2 25 24 22   BUN 76* 65* 64*  CREATININE 2.54* 1.75* 1.88*  GLUCOSE 167* 139* 155*   Recent Labs  Lab 05/02/2017 1037  05/18/2017 1727 05/25/17 0337  HGB 12.5 11.5* 14.9  HCT 39.2 35.8* 48.2*  WBC 21.1* 25.0* 28.4*  PLT 340 303 284   Ct Abdomen Pelvis Wo Contrast  Result Date: 05/03/2017 CLINICAL DATA:  Abdominal pain. Fever. Abscess suspected. Recently on antibiotics. EXAM: CT ABDOMEN AND PELVIS WITHOUT CONTRAST TECHNIQUE: Multidetector CT imaging of the abdomen and pelvis was performed following the standard protocol without IV contrast. COMPARISON:  07/05/2013 plain films. FINDINGS: Lower chest: Mild motion degradation. Cardiomegaly, without pericardial or pleural effusion. Mild bilateral pleural thickening. Left breast implant. Hepatobiliary: Normal liver. 1.9 cm gallstone with mild gallbladder distension. No specific evidence of acute cholecystitis. Mild motion degradation throughout the abdomen. No biliary duct dilatation. Pancreas: Normal pancreas for age. Spleen: Normal in size, without focal abnormality. Adrenals/Urinary Tract: Normal adrenal glands. Expected renal cortical thinning for age. Bilateral extrarenal pelves. Normal bladder. Stomach/Bowel: Normal stomach, without wall thickening. 9.5 cm stool ball within the rectum. Surgical sutures within the rectum. There is pericolonic edema surrounding the rectum and sigmoid, including on image 57/series 2. Stool is seen throughout the more proximal colon. Normal terminal ileum. Appendix not visualized. Upper normal small bowel caliber, including proximally at 3.3 cm on image 20/series 2. Vascular/Lymphatic: Advanced aortic and branch vessel atherosclerosis. Splenic artery aneurysm of 11 mm on image 20/series 2. No abdominopelvic adenopathy. Reproductive: Hysterectomy.  No adnexal mass. Other: No free intraperitoneal air. No free fluid. Fat containing ventral  abdominal wall hernia is small. Musculoskeletal: Osteopenia. Tarlov cysts. L1-L3 mild superior endplate compression deformities. IMPRESSION: 1. Findings most consistent with fecal impaction and likely secondary  colitis/proctitis. 2. Borderline small bowel distension is likely due to adynamic ileus. Low-grade partial small bowel obstruction is felt less likely. 3. Fat containing ventral abdominal wall hernia. 4. Limitations including motion and lack of IV contrast. 5.  Aortic Atherosclerosis (ICD10-I70.0). 6. Cholelithiasis with nonspecific gallbladder distension. Correlate with right upper quadrant symptoms and possibly dedicated ultrasound. 7. 11 mm splenic artery aneurysm. Electronically Signed   By: Abigail Miyamoto M.D.   On: 05/11/2017 13:59   Dg Chest 1 View  Result Date: 05/26/2017 CLINICAL DATA:  Altered mental status. EXAM: CHEST 1 VIEW COMPARISON:  Right humerus x-rays dated April 30, 2017. Chest x-ray dated May 18, 2016. FINDINGS: The cardiomediastinal silhouette remains at the upper limits of normal in size. Atherosclerotic calcification of the aortic arch. Normal pulmonary vascularity. Hazy density projecting over the left lower lung may reflect overlying soft tissue versus a small layering pleural effusion. Left basilar atelectasis. No consolidation or pneumothorax. Unchanged right proximal humerus fracture. IMPRESSION: 1. Hazy density projecting over the left lower lung may reflect overlying soft tissue versus a small layering pleural effusion. 2. Unchanged right proximal humerus fracture. Electronically Signed   By: Titus Dubin M.D.   On: 05/15/2017 11:35   Dg Abd 1 View  Result Date: 05/25/2017 CLINICAL DATA:  Nasogastric tube placement EXAM: ABDOMEN - 1 VIEW COMPARISON:  Abdominal radiograph 05/25/2017 FINDINGS: Nasogastric tube tip and side-port overlie the gastric fundus. Dilated loops of gas-filled small bowel are unchanged. IMPRESSION: NG tube tip and side port at the upper gastric fundus. Electronically Signed   By: Ulyses Jarred M.D.   On: 05/25/2017 05:34   Ct Head Wo Contrast  Result Date: 05/15/2017 CLINICAL DATA:  Altered level of consciousness. History of subdural hematoma  EXAM: CT HEAD WITHOUT CONTRAST TECHNIQUE: Contiguous axial images were obtained from the base of the skull through the vertex without intravenous contrast. COMPARISON:  CT head 04/07/2017, 02/24/2017 FINDINGS: Brain: Interval resolution of extra-axial fluid collections on the right. Negative for acute hemorrhage, infarct, mass. Generalized atrophy with chronic microvascular ischemia in the white matter. Vascular: Negative for hyperdense vessel Skull: Negative Sinuses/Orbits: Bilateral ocular surgery. No orbital mass. Paranasal sinuses clear Other: None IMPRESSION: Atrophy and chronic microvascular ischemia. No acute abnormality. Interval resolution of previous identified subdural fluid on the right. Electronically Signed   By: Franchot Gallo M.D.   On: 05/25/2017 11:26   Dg Abd Portable 1v  Result Date: 05/25/2017 CLINICAL DATA:  Nausea and vomiting. EXAM: PORTABLE ABDOMEN - 1 VIEW COMPARISON:  CT yesterday FINDINGS: Large rectal and descending colonic stool burden again seen. Slight increased gaseous distention of small bowel in the central abdomen measuring up to 3.8 cm. No evidence of free air on supine view. IMPRESSION: Increasing small bowel distention likely progressive ileus. Large descending and rectosigmoid stool burden with probable fecal impaction again seen. Electronically Signed   By: Jeb Levering M.D.   On: 05/25/2017 03:44   SIGNIFICANT EVENTS  11/26 - admit, low BP, sepsis protocol started, surgical consult  STUDIES:  11/26- CT abdo>>> stool impaction, gallbladder distention, stone, no peri fluid  ASSESSMENT / PLAN:  Hypovolemia Sepsis - in setting of suspected colitis, constipation  Lactic acidosis - hypovolemia Constipation, obstipation Unlikely acute cholesystitis Hypernatremia LFT elevation Rule out colitis ARF, ATN, hypovolemia, pre renal End-Stage Dementia   Plan: Change IVF to  D5w at 58ml/hr Continue Vanco / Zosyn  Assess abdominal US  Follow I/O's  Not a  candidate for BiPAP due to aspiration risk CCS following NGT to LIS Aspiration precautions Intermittent CXR   Trend PCT DNR/DNI > agree, if further decline, would consider shifting to comfort focused care.  She is end stage dementia.   Family asking about increased needs at SNF, concerned her level of care may need to increase.  Consider SW consult, defer to primary MD regarding placement.   PCCM will be available PRN.    Noe Gens, NP-C [facility] Pgr: [phone] or if no answer [phone] 05/25/2017, 11:24 AM  STAFF NOTE: I, Merrie Roof, MD FACP have personally reviewed patient's available data, including medical history, events of note, physical examination and test results as part of my evaluation. I have discussed with resident/NP and other care providers such as pharmacist, RN and RRT. In addition, I personally evaluated patient and elicited key findings of: eyes open intermittent, mouth open, chest cta, mild ruq tenderness, more increased distention, soft abdo, no r/g, mild edema, was pos 5.5 liters but seem to see worsening hypernatremia and hemoconcentration showing [place] how dry she is, change to d5w, assess chem in pm, she has had BM, unclear source remains still with fevers today, assess RUQ [place] for chole, if suggestive would consider IR drain, maintain zosyn, she has increased rr, she is a poor candidate for NIMV and would be futile for any forms of life support, if she declines further, she is NOT a candidate for pressors, it would not change outcome, family agrees, her AD would not want this or peg tube going firward in future, maintain pos balance, keep NGT to int suction with partial SBO ilues, I updated family in full. Spent a lot of time discussing her poor outcome likely in setting of advanced terminal dementia    Lavon Paganini. Titus Mould, MD, [place] Pgr: [facility] 05/25/2017 1:55 PM

## 2017-05-26 DIAGNOSIS — Z7189 Other specified counseling: Secondary | ICD-10-CM

## 2017-05-26 DIAGNOSIS — Z515 Encounter for palliative care: Secondary | ICD-10-CM

## 2017-05-26 LAB — BASIC METABOLIC PANEL
Anion gap: 18 — ABNORMAL HIGH (ref 5–15)
BUN: 92 mg/dL — AB (ref 6–20)
CHLORIDE: 114 mmol/L — AB (ref 101–111)
CO2: 21 mmol/L — ABNORMAL LOW (ref 22–32)
CREATININE: 3.23 mg/dL — AB (ref 0.44–1.00)
Calcium: 7.6 mg/dL — ABNORMAL LOW (ref 8.9–10.3)
GFR calc Af Amer: 14 mL/min — ABNORMAL LOW (ref >60)
GFR calc non Af Amer: 12 mL/min — ABNORMAL LOW (ref >60)
GLUCOSE: 112 mg/dL — AB (ref 65–99)
Potassium: 4.9 mmol/L (ref 3.5–5.1)
SODIUM: 153 mmol/L — AB (ref 135–145)

## 2017-05-26 LAB — CBC
HCT: 44.5 % (ref 36.0–46.0)
HEMOGLOBIN: 14.5 g/dL (ref 12.0–15.0)
MCH: 30 pg (ref 26.0–34.0)
MCHC: 32.6 g/dL (ref 30.0–36.0)
MCV: 92.1 fL (ref 78.0–100.0)
PLATELETS: 231 10*3/uL (ref 150–400)
RBC: 4.83 MIL/uL (ref 3.87–5.11)
RDW: 15.5 % (ref 11.5–15.5)
WBC: 32.9 10*3/uL — ABNORMAL HIGH (ref 4.0–10.5)

## 2017-05-26 LAB — CULTURE, GROUP A STREP (THRC)

## 2017-05-26 LAB — GLUCOSE, CAPILLARY
GLUCOSE-CAPILLARY: 97 mg/dL (ref 65–99)
Glucose-Capillary: 125 mg/dL — ABNORMAL HIGH (ref 65–99)

## 2017-05-26 MED ORDER — GLYCOPYRROLATE 0.2 MG/ML IJ SOLN: 0.4000 mg | INTRAMUSCULAR | Status: DC | PRN

## 2017-05-26 MED ORDER — LORAZEPAM 2 MG/ML IJ SOLN: 0.5000 mg | INTRAMUSCULAR | Status: DC | PRN

## 2017-05-26 MED ORDER — HYDROMORPHONE HCL 1 MG/ML IJ SOLN
0.5000 mg | INTRAMUSCULAR | Status: DC | PRN
  Administered 2017-05-26: 0.5 mg via INTRAVENOUS
  Filled 2017-05-26: qty 1

## 2017-05-26 NOTE — Progress Notes (Signed)
Called family with update, Patient BP is decreasing and has been in the 70's and 80's. I let them know I talked with the providers and they said monitor it and let them know if they could answer any questions for the family.

## 2017-05-26 NOTE — Consult Note (Signed)
Consultation Note Date: 05/26/2017   Patient Name: Erika Whitney  DOB: 1925-06-15  MRN: [id number]  Age / Sex: 83 y.o., female  PCP: Aletha Halim., PA-C Referring Physician: Robbie Lis, MD  Reason for Consultation: Establishing goals of care  HPI/Patient Profile: 83 y.o. female   admitted on 04/29/2017     Clinical Assessment and Goals of Care:  83 yo lady with advanced Alzheimer's, lives in memory care unit, has history of rectal prolapse with GI bleeding, s/p lap rectopexy with sigmoid colectomy 06/30/2013 by Dr. Marcello Moores, HLD, HTN, hypothyroidism, presented from Memory care unit to ED via EMS as she was found unresponsive.  Patient has since been admitted to hospital medicine service, with PCCM and surgery following for sepsis, septic shock, colitis, proctitis with ongoing leukocytosis, lactic acidosis and worsening renal function, hypotension, no longer awake/alert, not mentating.   A palliative consult has been placed for goals of care discussions.   The patient is an elderly lady resting in bed, she is not awake, not alert, she has an NGT draining dark material. Family is at the bedside.   I introduced myself and palliative care as follows: Palliative medicine is specialized medical care for people living with serious illness. It focuses on providing relief from the symptoms and stress of a serious illness. The goal is to improve quality of life for both the patient and the family.  We reviewed the patient's baseline health and condition prior to hospitalization. We discussed the scope of current hospitalization, goals, wishes and values discussed.   We reviewed scope of comfort measures and hospice philosophy of care in detail, all of the family's questions answered to the best of my ability, see recommendations below, thank you for the consult.   NEXT OF KIN  adult children.   SUMMARY  OF RECOMMENDATIONS   DNR DNI.  Comfort measures only Continue NGT for suction as a comfort measure D/C antibiotics and excessive blood work.  Chaplain consult Comfort cart for family.  Patient may be too critically ill to tolerate transfer to residential hospice Prognosis hours to some very limited number of days, frankly and compassionately discussed with family present in the room.  May transfer out of step down to non tele floor   Code Status/Advance Care Planning:  DNR    Symptom Management:    as above   Palliative Prophylaxis:   Delirium Protocol  Additional Recommendations (Limitations, Scope, Preferences):  Full Comfort Care  Psycho-social/Spiritual:   Desire for further Chaplaincy support:yes  Additional Recommendations: Education on Hospice  Prognosis:   Hours - Days  Discharge Planning: Anticipated Hospital Death      Primary Diagnoses: Present on Admission: . Anxiety and depression . Dementia . Dyslipidemia . Hypothyroidism . Leukocytosis . Hypokalemia . Lactic acidosis . Colitis . Sepsis ([place])   I have reviewed the medical record, interviewed the patient and family, and examined the patient. The following aspects are pertinent.  Past Medical History:  Diagnosis Date  . Arthritis   . Cancer ([place])  BREAST  . Dementia   . Rectal prolapse    Social History   Socioeconomic History  . Marital status: Widowed    Spouse name: None  . Number of children: None  . Years of education: None  . Highest education level: None  Social Needs  . Financial resource strain: None  . Food insecurity - worry: None  . Food insecurity - inability: None  . Transportation needs - medical: None  . Transportation needs - non-medical: None  Occupational History  . None  Tobacco Use  . Smoking status: Never Smoker  . Smokeless tobacco: Never Used  Substance and Sexual Activity  . Alcohol use: No  . Drug use: No  . Sexual activity: No  Other  Topics Concern  . None  Social History Narrative  . None   History reviewed. No pertinent family history. Scheduled Meds: Continuous Infusions: . dextrose 75 mL/hr at 05/25/17 1203   PRN Meds:.acetaminophen, clonazePAM, glycopyrrolate, HYDROmorphone (DILAUDID) injection, LORazepam, neomycin-bacitracin-polymyxin, ondansetron **OR** ondansetron (ZOFRAN) IV, zinc oxide Medications Prior to Admission:  Prior to Admission medications   Medication Sig Start Date End Date Taking? Authorizing Provider  acetaminophen (TYLENOL) 500 MG tablet Take 500 mg by mouth every 4 (four) hours as needed for headache.   Yes [provider]  alum & mag hydroxide-simeth ([place]) 200-200-20 MG/5ML suspension Take 30 mLs by mouth every 6 (six) hours as needed for indigestion or heartburn.   Yes [provider]  clonazePAM (KLONOPIN) 0.5 MG tablet Take 0.5 mg by mouth at bedtime.    Yes [provider]  Dextromethorphan-Quinidine (NUEDEXTA) 20-10 MG CAPS Take 1 capsule by mouth every 12 (twelve) hours.   Yes [provider]  divalproex (DEPAKOTE SPRINKLE) 125 MG capsule Take 125 mg by mouth daily.   Yes [provider]  donepezil (ARICEPT) 10 MG tablet Take 10 mg by mouth 2 (two) times daily.    Yes [provider]  Enteral Nutrition Supplies MISC Take 118 mLs by mouth 3 (three) times daily.   Yes [provider]  guaifenesin (ROBITUSSIN) 100 MG/5ML syrup Take 200 mg by mouth 4 (four) times daily as needed for cough or congestion.   Yes [provider]  hydrochlorothiazide (HYDRODIURIL) 12.5 MG tablet Take 25 mg by mouth daily.   Yes [provider]  hydrOXYzine (ATARAX/VISTARIL) 25 MG tablet Take 50 mg by mouth daily.    Yes [provider]  loperamide (IMODIUM A-D) 2 MG tablet Take 2 mg by mouth 4 (four) times daily as needed for diarrhea or loose stools.   Yes [provider]  magnesium hydroxide (MILK OF MAGNESIA)  400 MG/5ML suspension Take 30 mLs by mouth at bedtime as needed for mild constipation.   Yes [provider]  MELATONIN PO Take 10 mg by mouth at bedtime.    Yes [provider]  memantine (NAMENDA XR) 28 MG CP24 24 hr capsule Take 28 mg by mouth daily.   Yes [provider]  mirtazapine (REMERON) 30 MG tablet Take 30 mg by mouth at bedtime.   Yes [provider]  neomycin-bacitracin-polymyxin (NEOSPORIN) ointment Apply 1 application topically as needed for wound care (for skin tear abrasions). apply to eye   Yes [provider]  QUEtiapine (SEROQUEL) 25 MG tablet Take 25-50 mg by mouth as directed. Take 1 tablet in the morning and 2 tablets in the evening   Yes [provider]  [place] ointment  04/27/17  Yes [provider]  simvastatin (ZOCOR) 10 MG tablet Take 10 mg by mouth at bedtime.    Yes [provider]  Skin Protectants, Misc. (DIMETHICONE-ZINC OXIDE) cream Apply 1 application topically as directed. After each diaper change   Yes [provider]  SYNTHROID 112 MCG tablet Take 112 mcg by mouth daily before breakfast.  05/10/2017  Yes [provider]  dextromethorphan-guaiFENesin (TUSSIN DM) 10-100 MG/5ML liquid Take 5 mLs by mouth every 6 (six) hours as needed. Patient taking differently: Take 10 mLs by mouth every 6 (six) hours as needed for cough.  05/26/15   Carmin Muskrat, MD   Allergies  Allergen Reactions  . Ibuprofen     Makes blood pressure go up  PPS 10% Review of Systems Non verbal  Physical Exam Weak appearing elderly lady S1 S2 Shallow resp Abdomen distended Has some coolness no mottling of extremities DJD changes on upper and lower extremity digits Has NGT draining dark material Pale, weak, frail appearing lady  Vital Signs: BP (!) 92/32   Pulse (!) 119   Temp (!) 100.4 F (38 C) (Axillary)   Resp (!) 44   Ht 5\' 3"  (1.6 m)   Wt 55.8 kg (123 lb 0.3 oz)   SpO2 100%    BMI 21.79 kg/m  Pain Assessment: PAINAD   Pain Score: 0-No pain   SpO2: SpO2: 100 % O2 Device:SpO2: 100 % O2 Flow Rate: .O2 Flow Rate (L/min): 4 L/min  IO: Intake/output summary:   Intake/Output Summary (Last 24 hours) at 05/26/2017 1053 Last data filed at 05/26/2017 0400 Gross per 24 hour  Intake 251.25 ml  Output 200 ml  Net 51.25 ml    LBM: Last BM Date: 05/25/17 Baseline Weight: Weight: 57.8 kg (127 lb 6.8 oz) Most recent weight: Weight: 55.8 kg (123 lb 0.3 oz)     Palliative Assessment/Data:     Time In:  10 Time Out:  11.10 Time Total:  70 min  Greater than 50%  of this time was spent counseling and coordinating care related to the above assessment and plan.  Signed by: Loistine Chance, MD  [phone]  Please contact Palliative Medicine Team phone at [phone] for questions and concerns.  For individual provider: See Shea Evans

## 2017-05-26 NOTE — Progress Notes (Signed)
Placed pt. on humidified HFNC, (salter)@ 10 lpm due to oxygen saturations <88% while on 6 lpm n/c, saturations >'d to 96-97% after pt. placed on, currently has NG tube in L side nose and is dominant mouth breather, RR 40's while in no distress, Venturi mask left at bedside if needed, RT to monitor.

## 2017-05-26 NOTE — Progress Notes (Signed)
Patient transferred from ICU.  Patient tachypneic, showing mottled extremities, non-responsive.  Skin tear on Right buttocks covered with foam  dressing.

## 2017-05-26 NOTE — Progress Notes (Signed)
   05/26/17 1300  Clinical Encounter Type  Visited With Family  Visit Type Initial;Psychological support;Spiritual support;Patient actively dying  Referral From Nurse  Consult/Referral To Chaplain  Spiritual Encounters  Spiritual Needs Emotional;Other (Comment) (Spiritual Care conversation/Support)  Stress Factors  Patient Stress Factors Not reviewed  Family Stress Factors Health changes;Loss   I visited with the patient's daughter per Spiritual Care consult. The patient's daughter is at peace with the patient's condition and wants her to pass peacefully.  There were no needs at this time.  Please, contact Spiritual Care for further assistance.   [place] M.Div.

## 2017-05-26 NOTE — Plan of Care (Signed)
  Not Progressing Health Behavior/Discharge Planning: Ability to manage health-related needs will improve 05/26/2017 1639 - Not Progressing by Staci Righter, RN Clinical Measurements: Ability to maintain clinical measurements within normal limits will improve 05/26/2017 1639 - Not Progressing by Staci Righter, RN Respiratory complications will improve 05/26/2017 1639 - Not Progressing by Staci Righter, RN Activity: Risk for activity intolerance will decrease 05/26/2017 1639 - Not Progressing by Staci Righter, RN

## 2017-05-26 NOTE — Progress Notes (Addendum)
Patient ID: Erika Whitney, female   DOB: [birth year], 83 y.o.   MRN: [id number]  PROGRESS NOTE    Erika Whitney  TKW:[id number] DOB: 1924-08-02 DOA: 05/25/2017  PCP: Aletha Halim., PA-C   Brief Narrative:  83 y.o.femalewith known hx of rectal prolapse with GI bleeding, s/p lap rectopexy with sigmoid colectomy 06/30/2013 by Dr. Marcello Moores, HLD, HTN, hypothyroidism, presented from Memory care unit to ED via EMS as she was found unresponsive. Pt was apparently in her usual state of health last week but her family has not seen her since 05/20/2017.   In ED, pt was calm, NAD, T 99.6 F, HR up to 130's, SBP in 80's, blood work notable for Na 152, K 3.4, Cr 2.54, WBC 21K, lactic acid 8.06. CT abd with without contrast with fecal impaction and secondary colitis/proctitis. Surgery team consulted for assistance.      Assessment & Plan:   Principal Problem: Sepsis, septic shock / Colitis/Proctitis / Leukocytosis / Lactic acidosis - Sepsis criteria met on the admission with tachycardia, tachypnea, hypoxia, hypotension, leukocytosis and lactic acidosis - CT scan on the admission showed fecal impaction and possible colitis/proctitis, also adynamic ileus and possibly low-grade partial small bowel obstruction - Lactic acid was 8.6 on the admission. Lactic acid now within normal limits  - Urinalysis on admission showed no leukocytes  - Chest x-ray showed hazy density over the left lower lung which may reflect soft tissue or small layering pleural effusion  - Blood and urine cultures so far show no growth  - Patient is on Zosyn  - She is now hypotensive and leukocytosis is worsening. Family requested palliative care for goals of care. We appreciate palliative care assistance  - Patient is DNR/DNI, we will continue supportive care   Active Problems: Adynamic ileus, possible small bowel obstruction  - Seen by surgery in consultation  - Patient has had multiple bowel movements after disimpaction  and enema   Nausea and vomiting - Patient had nausea and vomiting on 05/18/2017 - Abdominal x-ray 05/21/2017 showed increasing small bowel distention likely progressive ileus with large stool burden - Abdominal ultrasound showed cholelithiasis but no evidence of acute cholecystitis - NG tube in place  AKI superimposed on CKD stage 3 - Baseline Cr 1.18 - Creatinine continues to rise, likely in the setting of sepsis, septic shock   Hypernatremia - Secondary to dehydration  - continue supportive care with IV fluids  Acute metabolic encephalopathy / Dementia without behavioral disturbance  - CT head without acute intracranial findings  - Altered mental status likely secondary to combination of dementia as well as sepsis  - No significant changes in mental status since admission   Atrial fibrillation - Seen on initial telemetry in ED but because of hypotension limited use of beta blockers or calcium channel blockers   Hypokalemia - Due to sepsis - Supplemented   Depression and anxiety - Holding Seroquel   Hypothyroidism - Continue synthroid   Dyslipidemia - Holding statin due to transaminitis    DVT prophylaxis: SCD's Code Status: DNR/DNI  Family Communication: no family at the bedside this am Disposition Plan: not yet stable for discharge, remains in SDU due to acute critical issues including hypotension   Time Spent  Critical care time spent : 65 minutes examining the patient, obtaining CCM consultation, PCT consultation, coordinating care and management.The medical decision making on this patient was of high complexity, the critically ill patient is at high risk for clinical deterioration, therefore this is a level 3 visit.  Consultants:   PCCM  Surgery  Palliative care  Procedures:   NG tube place 11/218  Antimicrobials:   Zosyn 11/26 -->   Subjective: No overnight events.  Objective: Vitals:   05/26/17 0400 05/26/17 0405 05/26/17 0430  05/26/17 0500  BP: (!) 77/29 (!) 76/49 (!) 81/35 (!) 82/35  Pulse:   (!) 119 (!) 119  Resp: (!) 53 (!) 36 (!) 49 (!) 49  Temp:      TempSrc:      SpO2:   100% 100%  Weight:      Height:        Intake/Output Summary (Last 24 hours) at 05/26/2017 0533 Last data filed at 05/26/2017 0400 Gross per 24 hour  Intake 254.25 ml  Output 600 ml  Net -345.75 ml   Filed Weights   05/25/17 1000  Weight: 57.8 kg (127 lb 6.8 oz)    Examination:  General exam: Appears in no distress Respiratory system: coarse and rhonchorous breath sounds, no wheezing  Cardiovascular system: S1 & S2 heard, tachycardic Gastrointestinal system: Abdomen is nondistended, has NG tube in place  Central nervous system: Alert and oriented. No focal neurological deficits. Extremities: Symmetric, no edema Skin: warm, dry  Psychiatry: Pt is sleeping, responds to verbal stimuli   Data Reviewed: I have personally reviewed following labs and imaging studies  CBC: Recent Labs  Lab 05/22/2017 1037 05/19/2017 1727 05/25/17 0337 05/26/17 0335  WBC 21.1* 25.0* 28.4* 32.9*  NEUTROABS 17.7* 19.3*  --   --   HGB 12.5 11.5* 14.9 14.5  HCT 39.2 35.8* 48.2* 44.5  MCV 92.9 92.3 94.0 92.1  PLT 340 303 284 940   Basic Metabolic Panel: Recent Labs  Lab 05/04/2017 1037 05/15/2017 1727 05/25/17 0337 05/25/17 1755 05/26/17 0335  NA 152* 149* 155* 155* 153*  K 3.4* 2.9* 4.6 4.1 4.9  CL 109 114* 118* 115* 114*  CO2 _0 19* 21*  GLUCOSE 167* 139* 155* 184* 112*  BUN 76* 65* 64* 78* 92*  CREATININE 2.54* 1.75* 1.88* 2.68* 3.23*  CALCIUM 8.1* 6.9* 7.8* 7.9* 7.6*   GFR: Estimated Creatinine Clearance: 9.8 mL/min (A) (by C-G formula based on SCr of 3.23 mg/dL (H)). Liver Function Tests: Recent Labs  Lab 05/22/2017 1037 04/30/2017 1727 05/25/17 0337  AST 352* 413* 290*  ALT 280* 307* 306*  ALKPHOS 127* 96 117  BILITOT 1.0 1.2 1.1  PROT 6.3* 5.1* 6.1*  ALBUMIN 2.2* 1.9* 2.1*   No results for input(s): LIPASE,  AMYLASE in the last 168 hours. No results for input(s): AMMONIA in the last 168 hours. Coagulation Profile: No results for input(s): INR, PROTIME in the last 168 hours. Cardiac Enzymes: No results for input(s): CKTOTAL, CKMB, CKMBINDEX, TROPONINI in the last 168 hours. BNP (last 3 results) No results for input(s): PROBNP in the last 8760 hours. HbA1C: No results for input(s): HGBA1C in the last 72 hours. CBG: Recent Labs  Lab 05/25/17 1229 05/25/17 1526 05/25/17 1930 05/25/17 2319 05/26/17 0331  GLUCAP 210* 194* 173* 98 97   Lipid Profile: No results for input(s): CHOL, HDL, LDLCALC, TRIG, CHOLHDL, LDLDIRECT in the last 72 hours. Thyroid Function Tests: No results for input(s): TSH, T4TOTAL, FREET4, T3FREE, THYROIDAB in the last 72 hours. Anemia Panel: No results for input(s): VITAMINB12, FOLATE, FERRITIN, TIBC, IRON, RETICCTPCT in the last 72 hours. Urine analysis:    Component Value Date/Time   COLORURINE AMBER (A) 05/15/2017 1405   APPEARANCEUR HAZY (A) 05/08/2017 1405   LABSPEC 1.020 05/25/2017 1405  PHURINE 5.0 05/01/2017 1405   GLUCOSEU NEGATIVE 05/28/2017 1405   HGBUR NEGATIVE 05/11/2017 1405   BILIRUBINUR SMALL (A) 05/21/2017 [place] 04/29/2017 1405   PROTEINUR NEGATIVE 05/02/2017 1405   NITRITE NEGATIVE 05/09/2017 1405   LEUKOCYTESUR NEGATIVE 05/28/2017 1405   Sepsis Labs: _0 (procalcitonin:4,lacticidven:4)   ) Recent Results (from the past 240 hour(s))  Rapid Strep Screen (Not at [facility])     Status: None   Collection Time: 04/29/2017 10:58 AM  Result Value Ref Range Status   Streptococcus, Group A Screen (Direct) NEGATIVE NEGATIVE Final    Comment: (NOTE) A Rapid Antigen test may result negative if the antigen level in the sample is below the detection level of this test. The FDA has not cleared this test as a stand-alone test therefore the rapid antigen negative result has reflexed to a Group A Strep culture.   Culture, group A  strep     Status: None (Preliminary result)   Collection Time: 05/12/2017 10:58 AM  Result Value Ref Range Status   Specimen Description THROAT  Final   Special Requests NONE Reflexed from [id number]  Final   Culture   Final    CULTURE REINCUBATED FOR BETTER GROWTH Performed at [facility], [place] [address] [place], [place] [address]    Report Status PENDING  Incomplete  Blood Culture (routine x 2)     Status: None (Preliminary result)   Collection Time: 04/30/2017 11:20 AM  Result Value Ref Range Status   Specimen Description BLOOD LEFT ANTECUBITAL  Final   Special Requests   Final    BOTTLES DRAWN AEROBIC AND ANAEROBIC Blood Culture adequate volume   Culture   Final    NO GROWTH 1 DAY Performed at [facility], [place] [address] [place], [place] [address]    Report Status PENDING  Incomplete  Blood Culture (routine x 2)     Status: None (Preliminary result)   Collection Time: 04/30/2017 11:52 AM  Result Value Ref Range Status   Specimen Description BLOOD RIGHT ANTECUBITAL  Final   Special Requests   Final    BOTTLES DRAWN AEROBIC AND ANAEROBIC Blood Culture results may not be optimal due to an excessive volume of blood received in culture bottles   Culture   Final    NO GROWTH 1 DAY Performed at [facility], [place] [address] [place], [place] [address]    Report Status PENDING  Incomplete  Urine culture     Status: None   Collection Time: 05/23/2017  2:05 PM  Result Value Ref Range Status   Specimen Description URINE, CLEAN CATCH  Final   Special Requests NONE  Final   Culture   Final    NO GROWTH Performed at [facility], [place] [address], [place], [place] [address]    Report Status 05/25/2017 FINAL  Final  MRSA PCR Screening     Status: None   Collection Time: 05/20/2017  5:09 PM  Result Value Ref Range Status   MRSA by PCR NEGATIVE NEGATIVE Final    Comment:        The GeneXpert MRSA Assay (FDA approved for NASAL specimens only), is one component of  a comprehensive MRSA colonization surveillance program. It is not intended to diagnose MRSA infection nor to guide or monitor treatment for MRSA infections.       Radiology Studies: Ct Abdomen Pelvis Wo Contrast  Result Date: 05/14/2017 CLINICAL DATA:  Abdominal pain. Fever. Abscess suspected.  Recently on antibiotics. EXAM: CT ABDOMEN AND PELVIS WITHOUT CONTRAST TECHNIQUE: Multidetector CT imaging of the abdomen and pelvis was performed following the standard protocol without IV contrast. COMPARISON:  07/05/2013 plain films. FINDINGS: Lower chest: Mild motion degradation. Cardiomegaly, without pericardial or pleural effusion. Mild bilateral pleural thickening. Left breast implant. Hepatobiliary: Normal liver. 1.9 cm gallstone with mild gallbladder distension. No specific evidence of acute cholecystitis. Mild motion degradation throughout the abdomen. No biliary duct dilatation. Pancreas: Normal pancreas for age. Spleen: Normal in size, without focal abnormality. Adrenals/Urinary Tract: Normal adrenal glands. Expected renal cortical thinning for age. Bilateral extrarenal pelves. Normal bladder. Stomach/Bowel: Normal stomach, without wall thickening. 9.5 cm stool ball within the rectum. Surgical sutures within the rectum. There is pericolonic edema surrounding the rectum and sigmoid, including on image 57/series 2. Stool is seen throughout the more proximal colon. Normal terminal ileum. Appendix not visualized. Upper normal small bowel caliber, including proximally at 3.3 cm on image 20/series 2. Vascular/Lymphatic: Advanced aortic and branch vessel atherosclerosis. Splenic artery aneurysm of 11 mm on image 20/series 2. No abdominopelvic adenopathy. Reproductive: Hysterectomy.  No adnexal mass. Other: No free intraperitoneal air. No free fluid. Fat containing ventral abdominal wall hernia is small. Musculoskeletal: Osteopenia. Tarlov cysts. L1-L3 mild superior endplate compression deformities.  IMPRESSION: 1. Findings most consistent with fecal impaction and likely secondary colitis/proctitis. 2. Borderline small bowel distension is likely due to adynamic ileus. Low-grade partial small bowel obstruction is felt less likely. 3. Fat containing ventral abdominal wall hernia. 4. Limitations including motion and lack of IV contrast. 5.  Aortic Atherosclerosis (ICD10-I70.0). 6. Cholelithiasis with nonspecific gallbladder distension. Correlate with right upper quadrant symptoms and possibly dedicated ultrasound. 7. 11 mm splenic artery aneurysm. Electronically Signed   By: Abigail Miyamoto M.D.   On: 05/18/2017 13:59   Dg Chest 1 View  Result Date: 05/10/2017 CLINICAL DATA:  Altered mental status. EXAM: CHEST 1 VIEW COMPARISON:  Right humerus x-rays dated April 30, 2017. Chest x-ray dated May 18, 2016. FINDINGS: The cardiomediastinal silhouette remains at the upper limits of normal in size. Atherosclerotic calcification of the aortic arch. Normal pulmonary vascularity. Hazy density projecting over the left lower lung may reflect overlying soft tissue versus a small layering pleural effusion. Left basilar atelectasis. No consolidation or pneumothorax. Unchanged right proximal humerus fracture. IMPRESSION: 1. Hazy density projecting over the left lower lung may reflect overlying soft tissue versus a small layering pleural effusion. 2. Unchanged right proximal humerus fracture. Electronically Signed   By: Titus Dubin M.D.   On: 05/01/2017 11:35   Dg Abd 1 View  Result Date: 05/25/2017 CLINICAL DATA:  Nasogastric tube placement EXAM: ABDOMEN - 1 VIEW COMPARISON:  Abdominal radiograph 05/25/2017 FINDINGS: Nasogastric tube tip and side-port overlie the gastric fundus. Dilated loops of gas-filled small bowel are unchanged. IMPRESSION: NG tube tip and side port at the upper gastric fundus. Electronically Signed   By: Ulyses Jarred M.D.   On: 05/25/2017 05:34   Ct Head Wo Contrast  Result Date:  05/28/2017 CLINICAL DATA:  Altered level of consciousness. History of subdural hematoma EXAM: CT HEAD WITHOUT CONTRAST TECHNIQUE: Contiguous axial images were obtained from the base of the skull through the vertex without intravenous contrast. COMPARISON:  CT head 04/07/2017, 02/24/2017 FINDINGS: Brain: Interval resolution of extra-axial fluid collections on the right. Negative for acute hemorrhage, infarct, mass. Generalized atrophy with chronic microvascular ischemia in the white matter. Vascular: Negative for hyperdense vessel Skull: Negative Sinuses/Orbits: Bilateral ocular surgery. No orbital mass. Paranasal  sinuses clear Other: None IMPRESSION: Atrophy and chronic microvascular ischemia. No acute abnormality. Interval resolution of previous identified subdural fluid on the right. Electronically Signed   By: Franchot Gallo M.D.   On: 05/23/2017 11:26   US Abdomen Limited  Result Date: 05/25/2017 CLINICAL DATA:  Abdominal pain.  Cholelithiasis. EXAM: ULTRASOUND ABDOMEN LIMITED RIGHT UPPER QUADRANT COMPARISON:  None. FINDINGS: Gallbladder: At least 1 2 cm calcified gallstone is seen. No evidence of gallbladder wall thickening or pericholecystic fluid. No sonographic Murphy sign noted by sonographer. Common bile duct: Diameter: 4 mm, within normal limits. Liver: No focal lesion identified. Within normal limits in parenchymal echogenicity. Portal vein is patent on color Doppler imaging with normal direction of blood flow towards the liver. IMPRESSION: Cholelithiasis. No sonographic signs of acute cholecystitis or biliary dilatation. Electronically Signed   By: Earle Gell M.D.   On: 05/25/2017 14:48   Dg Abd Portable 1v  Result Date: 05/25/2017 CLINICAL DATA:  Nausea and vomiting. EXAM: PORTABLE ABDOMEN - 1 VIEW COMPARISON:  CT yesterday FINDINGS: Large rectal and descending colonic stool burden again seen. Slight increased gaseous distention of small bowel in the central abdomen measuring up to 3.8 cm.  No evidence of free air on supine view. IMPRESSION: Increasing small bowel distention likely progressive ileus. Large descending and rectosigmoid stool burden with probable fecal impaction again seen. Electronically Signed   By: Jeb Levering M.D.   On: 05/25/2017 03:44        Scheduled Meds: . donepezil  10 mg Oral BID  . insulin aspart  0-15 Units Subcutaneous Q4H  . levothyroxine  112 mcg Oral QAC breakfast  . memantine  28 mg Oral Daily  . sodium chloride flush  3 mL Intravenous Q12H   Continuous Infusions: . dextrose 75 mL/hr at 05/25/17 1203  . piperacillin-tazobactam (ZOSYN)  IV Stopped (05/26/17 0107)     LOS: 2 days    Time spent: 25 minutes  Greater than 50% of the time spent on counseling and coordinating the care.   Leisa Lenz, MD [facility] Pager [phone]  If 7PM-7AM, please contact night-coverage [email or web address] Password [facility] 05/26/2017, 5:33 AM

## 2017-05-29 LAB — CULTURE, BLOOD (ROUTINE X 2)
CULTURE: NO GROWTH
Culture: NO GROWTH
Special Requests: ADEQUATE

## 2017-05-29 NOTE — Progress Notes (Signed)
PMT no charge note  Patient expired earlier this am, offered condolences to daughter present at the bedside. Reviewed some of the patient's life story, offered support and presence. Daughter is tearful and in shock, she says, she just doesn't know how to proceed. Reassured daughter to wait until the rest of the family arrived at the hospital and that our nursing staff will guide next steps.   Loistine Chance, MD [place] team

## 2017-05-29 NOTE — Progress Notes (Addendum)
Patient ID: Erika Whitney, female   DOB: Jul 22, 1924, 83 y.o.   MRN: [id number]  PROGRESS NOTE    Erika Whitney  WUJ:[id number] DOB: 09-Mar-1925 DOA: 05/25/2017  PCP: Aletha Halim., PA-C   Brief Narrative:  83 y.o.femalewith known hx of rectal prolapse with GI bleeding, s/p lap rectopexy with sigmoid colectomy 06/30/2013 by Dr. Marcello Moores, HLD, HTN, hypothyroidism, presented from Memory care unit to ED via EMS as she was found unresponsive. Pt was apparently in her usual state of health last week but her family has not seen her since 05/20/2017.   In ED, pt was calm, NAD, T 99.6 F, HR up to 130's, SBP in 80's, blood work notable for Na 152, K 3.4, Cr 2.54, WBC 21K, lactic acid 8.06. CT abd with without contrast with fecal impaction and secondary colitis/proctitis. Surgery team consulted for assistance. Palliative care consulted for goals of care. Family prefers comfort care.    Assessment & Plan:   Principal Problem: Sepsis, septic shock / Colitis/Proctitis / Leukocytosis / Lactic acidosis - Sepsis criteria met on the admission with tachycardia, tachypnea, hypoxia, hypotension, leukocytosis and lactic acidosis - CT scan on the admission showed fecal impaction and possible colitis/proctitis, also adynamic ileus and possibly low-grade partial small bowel obstruction - Lactic acid was 8.6 on the admission. Lactic acid now within normal limits  - Urinalysis on admission showed no leukocytes  - Chest x-ray showed hazy density over the left lower lung which may reflect soft tissue or small layering pleural effusion  - Blood and urine cultures so far show no growth  - Focus now on comfort care per family wishes    Active Problems: Adynamic ileus, possible small bowel obstruction  - Seen by surgery in consultation  - Patient has had multiple bowel movements after disimpaction and enema  - Comfort care   Nausea and vomiting - Patient had nausea and vomiting on 05/05/2017 - Abdominal  x-ray 05/11/2017 showed increasing small bowel distention likely progressive ileus with large stool burden - Abdominal ultrasound showed cholelithiasis but no evidence of acute cholecystitis - Continue NG tube suction   AKI superimposed on CKD stage 3 - Baseline Cr 1.18 - No further blood work to ensure comfort   Hypernatremia - Secondary to dehydration  - No further blood work, focus on comfort care   Acute metabolic encephalopathy / Dementia without behavioral disturbance / Depression and anxiety  - CT head without acute intracranial findings  - Altered mental status likely secondary to combination of dementia as well as sepsis  - No significant changes in mental status   Atrial fibrillation - Seen on initial telemetry in ED but because of hypotension limited use of beta blockers or calcium channel blockers   Hypokalemia - Due to sepsis - Supplemented   Dyslipidemia - Not on statin due to transaminitis    DVT prophylaxis: SCD;s Code Status: DNR/DNI  Family Communication: spoke with family 11/28 over the phone, gave an update  Disposition Plan: not stable for discharge or transfer at this time    Consultants:   PCCM  Surgery  Palliative care  Procedures:   NG tube place 11/218  Antimicrobials:   Zosyn 11/26 --> 11/28    Subjective: No overnight events.  Objective: Vitals:   05/26/17 1200 05/26/17 1600 05/26/17 2014 June 08, 2017 0524  BP: (!) 90/30 (!) 69/21 (!) 90/48 (!) 63/32  Pulse: (!) 115 (!) 112 (!) 106 (!) 109  Resp: (!) 43 (!) 52 (!) 64 (!) 45  Temp: Marland Kitchen)  101.6 F (38.7 C) (!) 101.8 F (38.8 C) (!) 101.4 F (38.6 C) (!) 101.6 F (38.7 C)  TempSrc: Axillary Axillary Axillary Axillary  SpO2: 100% 100% 100% 100%  Weight:      Height:        Intake/Output Summary (Last 24 hours) at 2017-06-03 0732 Last data filed at 05/26/2017 1647 Gross per 24 hour  Intake 2025 ml  Output 350 ml  Net 1675 ml   Filed Weights   05/25/17 1000 05/26/17  0500  Weight: 57.8 kg (127 lb 6.8 oz) 55.8 kg (123 lb 0.3 oz)    Examination:  General exam: calm, comfortable  Respiratory system: coarse sounds, no wheezing  Cardiovascular system: (+) S1, S2, rate controlled  Gastrointestinal system: NG tube (+), non tender  Central nervous system: Not responding to verbal stimuli  Extremities: no edema Skin: warm, dry Psychiatry: not able to assess, pt not responding to verbal stimuli   Data Reviewed: I have personally reviewed following labs and imaging studies  CBC: Recent Labs  Lab 05/07/2017 1037 05/01/2017 1727 05/25/17 0337 05/26/17 0335  WBC 21.1* 25.0* 28.4* 32.9*  NEUTROABS 17.7* 19.3*  --   --   HGB 12.5 11.5* 14.9 14.5  HCT 39.2 35.8* 48.2* 44.5  MCV 92.9 92.3 94.0 92.1  PLT 340 303 284 735   Basic Metabolic Panel: Recent Labs  Lab 05/26/2017 1037 05/25/2017 1727 05/25/17 0337 05/25/17 1755 05/26/17 0335  NA 152* 149* 155* 155* 153*  K 3.4* 2.9* 4.6 4.1 4.9  CL 109 114* 118* 115* 114*  CO2 _0 19* 21*  GLUCOSE 167* 139* 155* 184* 112*  BUN 76* 65* 64* 78* 92*  CREATININE 2.54* 1.75* 1.88* 2.68* 3.23*  CALCIUM 8.1* 6.9* 7.8* 7.9* 7.6*   GFR: Estimated Creatinine Clearance: 9.8 mL/min (A) (by C-G formula based on SCr of 3.23 mg/dL (H)). Liver Function Tests: Recent Labs  Lab 05/02/2017 1037 05/08/2017 1727 05/25/17 0337  AST 352* 413* 290*  ALT 280* 307* 306*  ALKPHOS 127* 96 117  BILITOT 1.0 1.2 1.1  PROT 6.3* 5.1* 6.1*  ALBUMIN 2.2* 1.9* 2.1*   No results for input(s): LIPASE, AMYLASE in the last 168 hours. No results for input(s): AMMONIA in the last 168 hours. Coagulation Profile: No results for input(s): INR, PROTIME in the last 168 hours. Cardiac Enzymes: No results for input(s): CKTOTAL, CKMB, CKMBINDEX, TROPONINI in the last 168 hours. BNP (last 3 results) No results for input(s): PROBNP in the last 8760 hours. HbA1C: No results for input(s): HGBA1C in the last 72 hours. CBG: Recent Labs  Lab  05/25/17 1526 05/25/17 1930 05/25/17 2319 05/26/17 0331 05/26/17 0730  GLUCAP 194* 173* 98 97 125*   Lipid Profile: No results for input(s): CHOL, HDL, LDLCALC, TRIG, CHOLHDL, LDLDIRECT in the last 72 hours. Thyroid Function Tests: No results for input(s): TSH, T4TOTAL, FREET4, T3FREE, THYROIDAB in the last 72 hours. Anemia Panel: No results for input(s): VITAMINB12, FOLATE, FERRITIN, TIBC, IRON, RETICCTPCT in the last 72 hours. Urine analysis:    Component Value Date/Time   COLORURINE AMBER (A) 05/05/2017 1405   APPEARANCEUR HAZY (A) 05/12/2017 1405   LABSPEC 1.020 05/11/2017 1405   PHURINE 5.0 05/08/2017 1405   GLUCOSEU NEGATIVE 05/23/2017 1405   HGBUR NEGATIVE 05/23/2017 1405   BILIRUBINUR SMALL (A) 05/08/2017 1405   KETONESUR NEGATIVE 05/09/2017 1405   PROTEINUR NEGATIVE 05/02/2017 1405   NITRITE NEGATIVE 04/29/2017 1405   LEUKOCYTESUR NEGATIVE 05/14/2017 1405   Sepsis Labs: _1 (procalcitonin:4,lacticidven:4)   )  Recent Results (from the past 240 hour(s))  Rapid Strep Screen (Not at [facility])     Status: None   Collection Time: 05/06/2017 10:58 AM  Result Value Ref Range Status   Streptococcus, Group A Screen (Direct) NEGATIVE NEGATIVE Final    Comment: (NOTE) A Rapid Antigen test may result negative if the antigen level in the sample is below the detection level of this test. The FDA has not cleared this test as a stand-alone test therefore the rapid antigen negative result has reflexed to a Group A Strep culture.   Culture, group A strep     Status: None   Collection Time: 05/10/2017 10:58 AM  Result Value Ref Range Status   Specimen Description THROAT  Final   Special Requests NONE Reflexed from [id number]  Final   Culture   Final    NO GROUP A STREP (S.PYOGENES) ISOLATED Performed at [facility], [address], [place], [place] [address]    Report Status 05/26/2017 FINAL  Final  Blood Culture (routine x 2)     Status: None (Preliminary result)    Collection Time: 05/01/2017 11:20 AM  Result Value Ref Range Status   Specimen Description BLOOD LEFT ANTECUBITAL  Final   Special Requests   Final    BOTTLES DRAWN AEROBIC AND ANAEROBIC Blood Culture adequate volume   Culture   Final    NO GROWTH 2 DAYS Performed at [facility], [place] [address], [place], [place] [address]    Report Status PENDING  Incomplete  Blood Culture (routine x 2)     Status: None (Preliminary result)   Collection Time: 05/08/2017 11:52 AM  Result Value Ref Range Status   Specimen Description BLOOD RIGHT ANTECUBITAL  Final   Special Requests   Final    BOTTLES DRAWN AEROBIC AND ANAEROBIC Blood Culture results may not be optimal due to an excessive volume of blood received in culture bottles   Culture   Final    NO GROWTH 2 DAYS Performed at [facility], [place] [address], [place], [place] [address]    Report Status PENDING  Incomplete  Urine culture     Status: None   Collection Time: 05/15/2017  2:05 PM  Result Value Ref Range Status   Specimen Description URINE, CLEAN CATCH  Final   Special Requests NONE  Final   Culture   Final    NO GROWTH Performed at [facility], [place] [address] [place], [place] [address]    Report Status 05/25/2017 FINAL  Final  MRSA PCR Screening     Status: None   Collection Time: 05/21/2017  5:09 PM  Result Value Ref Range Status   MRSA by PCR NEGATIVE NEGATIVE Final    Comment:        The GeneXpert MRSA Assay (FDA approved for NASAL specimens only), is one component of a comprehensive MRSA colonization surveillance program. It is not intended to diagnose MRSA infection nor to guide or monitor treatment for MRSA infections.       Radiology Studies: Ct Abdomen Pelvis Wo Contrast  Result Date: 05/01/2017 CLINICAL DATA:  Abdominal pain. Fever. Abscess suspected. Recently on antibiotics. EXAM: CT ABDOMEN AND PELVIS WITHOUT CONTRAST TECHNIQUE: Multidetector CT imaging of the abdomen and pelvis was performed  following the standard protocol without IV contrast. COMPARISON:  07/05/2013 plain films. FINDINGS: Lower chest: Mild motion degradation. Cardiomegaly, without pericardial or pleural effusion. Mild bilateral pleural thickening. Left breast implant. Hepatobiliary: Normal liver.  1.9 cm gallstone with mild gallbladder distension. No specific evidence of acute cholecystitis. Mild motion degradation throughout the abdomen. No biliary duct dilatation. Pancreas: Normal pancreas for age. Spleen: Normal in size, without focal abnormality. Adrenals/Urinary Tract: Normal adrenal glands. Expected renal cortical thinning for age. Bilateral extrarenal pelves. Normal bladder. Stomach/Bowel: Normal stomach, without wall thickening. 9.5 cm stool ball within the rectum. Surgical sutures within the rectum. There is pericolonic edema surrounding the rectum and sigmoid, including on image 57/series 2. Stool is seen throughout the more proximal colon. Normal terminal ileum. Appendix not visualized. Upper normal small bowel caliber, including proximally at 3.3 cm on image 20/series 2. Vascular/Lymphatic: Advanced aortic and branch vessel atherosclerosis. Splenic artery aneurysm of 11 mm on image 20/series 2. No abdominopelvic adenopathy. Reproductive: Hysterectomy.  No adnexal mass. Other: No free intraperitoneal air. No free fluid. Fat containing ventral abdominal wall hernia is small. Musculoskeletal: Osteopenia. Tarlov cysts. L1-L3 mild superior endplate compression deformities. IMPRESSION: 1. Findings most consistent with fecal impaction and likely secondary colitis/proctitis. 2. Borderline small bowel distension is likely due to adynamic ileus. Low-grade partial small bowel obstruction is felt less likely. 3. Fat containing ventral abdominal wall hernia. 4. Limitations including motion and lack of IV contrast. 5.  Aortic Atherosclerosis (ICD10-I70.0). 6. Cholelithiasis with nonspecific gallbladder distension. Correlate with right  upper quadrant symptoms and possibly dedicated ultrasound. 7. 11 mm splenic artery aneurysm. Electronically Signed   By: Abigail Miyamoto M.D.   On: 05/06/2017 13:59   Dg Chest 1 View  Result Date: 05/25/2017 CLINICAL DATA:  Altered mental status. EXAM: CHEST 1 VIEW COMPARISON:  Right humerus x-rays dated April 30, 2017. Chest x-ray dated May 18, 2016. FINDINGS: The cardiomediastinal silhouette remains at the upper limits of normal in size. Atherosclerotic calcification of the aortic arch. Normal pulmonary vascularity. Hazy density projecting over the left lower lung may reflect overlying soft tissue versus a small layering pleural effusion. Left basilar atelectasis. No consolidation or pneumothorax. Unchanged right proximal humerus fracture. IMPRESSION: 1. Hazy density projecting over the left lower lung may reflect overlying soft tissue versus a small layering pleural effusion. 2. Unchanged right proximal humerus fracture. Electronically Signed   By: Titus Dubin M.D.   On: 05/26/2017 11:35   Dg Abd 1 View  Result Date: 05/25/2017 CLINICAL DATA:  Nasogastric tube placement EXAM: ABDOMEN - 1 VIEW COMPARISON:  Abdominal radiograph 05/25/2017 FINDINGS: Nasogastric tube tip and side-port overlie the gastric fundus. Dilated loops of gas-filled small bowel are unchanged. IMPRESSION: NG tube tip and side port at the upper gastric fundus. Electronically Signed   By: Ulyses Jarred M.D.   On: 05/25/2017 05:34   Ct Head Wo Contrast  Result Date: 05/10/2017 CLINICAL DATA:  Altered level of consciousness. History of subdural hematoma EXAM: CT HEAD WITHOUT CONTRAST TECHNIQUE: Contiguous axial images were obtained from the base of the skull through the vertex without intravenous contrast. COMPARISON:  CT head 04/07/2017, 02/24/2017 FINDINGS: Brain: Interval resolution of extra-axial fluid collections on the right. Negative for acute hemorrhage, infarct, mass. Generalized atrophy with chronic microvascular  ischemia in the white matter. Vascular: Negative for hyperdense vessel Skull: Negative Sinuses/Orbits: Bilateral ocular surgery. No orbital mass. Paranasal sinuses clear Other: None IMPRESSION: Atrophy and chronic microvascular ischemia. No acute abnormality. Interval resolution of previous identified subdural fluid on the right. Electronically Signed   By: Franchot Gallo M.D.   On: 05/07/2017 11:26   US Abdomen Limited  Result Date: 05/25/2017 CLINICAL DATA:  Abdominal pain.  Cholelithiasis. EXAM: ULTRASOUND  ABDOMEN LIMITED RIGHT UPPER QUADRANT COMPARISON:  None. FINDINGS: Gallbladder: At least 1 2 cm calcified gallstone is seen. No evidence of gallbladder wall thickening or pericholecystic fluid. No sonographic Murphy sign noted by sonographer. Common bile duct: Diameter: 4 mm, within normal limits. Liver: No focal lesion identified. Within normal limits in parenchymal echogenicity. Portal vein is patent on color Doppler imaging with normal direction of blood flow towards the liver. IMPRESSION: Cholelithiasis. No sonographic signs of acute cholecystitis or biliary dilatation. Electronically Signed   By: Earle Gell M.D.   On: 05/25/2017 14:48   Dg Abd Portable 1v  Result Date: 05/25/2017 CLINICAL DATA:  Nausea and vomiting. EXAM: PORTABLE ABDOMEN - 1 VIEW COMPARISON:  CT yesterday FINDINGS: Large rectal and descending colonic stool burden again seen. Slight increased gaseous distention of small bowel in the central abdomen measuring up to 3.8 cm. No evidence of free air on supine view. IMPRESSION: Increasing small bowel distention likely progressive ileus. Large descending and rectosigmoid stool burden with probable fecal impaction again seen. Electronically Signed   By: Jeb Levering M.D.   On: 05/25/2017 03:44   Scheduled Meds:  Continuous Infusions: . dextrose 75 mL/hr at 05/26/17 2240     LOS: 3 days    Time spent: 25 minutes  Greater than 50% of the time spent on counseling and  coordinating the care.   Leisa Lenz, MD [facility] Pager [phone]  If 7PM-7AM, please contact night-coverage [email or web address] Password [facility] June 07, 2017, 7:32 AM

## 2017-05-29 NOTE — Progress Notes (Signed)
Pt expired 0835; 2 RN pronounced. Family present. Dr notified; filled out death certificate.

## 2017-05-29 NOTE — Discharge Summary (Signed)
Death Summary  Erika Whitney BUL:[id number] DOB: August 12, 1924 DOA: 06/15/2017  PCP: Aletha Halim., PA-C PCP/Office notified  Admit date: 06-15-2017 Date of Death: 06/18/17  Final Diagnoses:  Active Problems:   Leukocytosis   Colitis   Sepsis (HCC)   Dyslipidemia   Hypothyroidism   Anxiety and depression   Dementia   Hypokalemia   Lactic acidosis   Somnolence   Elevated LFTs   Dehydration   Acute kidney injury ([place])   Pressure injury of skin   Encounter for palliative care   Goals of care, counseling/discussion    History of present illness:  83 y.o.femalewith known hx of rectal prolapse with GI bleeding, s/p lap rectopexy with sigmoid colectomy 06/30/2013 by Dr. Marcello Moores, HLD, HTN, hypothyroidism, presented from Memory care unit to ED via EMS as she was found unresponsive. Pt was apparently in her usual state of health last week but her family has not seen her since 05/20/2017.   In ED, pt was calm, NAD, T 99.6 F, HR up to 130's, SBP in 80's, blood work notable for Na 152, K 3.4, Cr 2.54, WBC 21K, lactic acid 8.06. CT abd with without contrast with fecal impactionand secondary colitis/proctitis. Surgery team consulted for assistance. Palliative care consulted for goals of care. Family prefered comfort care.    Assessment & Plan:   Principal Problem: Sepsis, septic shock / Colitis/Proctitis / Leukocytosis / Lactic acidosis - Sepsis criteria met on the admission with tachycardia, tachypnea, hypoxia, hypotension, leukocytosis and lactic acidosis - CT scan on the admission showed fecal impaction and possible colitis/proctitis, also adynamic ileus and possibly low-grade partial small bowel obstruction - Lactic acid was 8.6 on the admission. Lactic acid now within normal limits  - Urinalysis on admission showed no leukocytes  - Chest x-ray showed hazy density over the left lower lung which may reflect soft tissue or small layering pleural effusion  - Blood and urine  cultures - no growth    Active Problems: Adynamic ileus, possible small bowel obstruction  - Seen by surgery in consultation  - Patient has had multiple bowel movements after disimpaction and enema  - Comfort care   Nausea and vomiting - Patient had nausea and vomiting on June 15, 2017 - Abdominal x-ray 2017/06/15 showed increasing small bowel distention likely progressive ileus with large stool burden - Abdominal ultrasound showed cholelithiasis but no evidence of acute cholecystitis - Had NG tube for suction  AKI superimposed on CKD stage 3 - Baseline Cr 1.18 - No further blood work to ensure comfort   Hypernatremia - Secondary to dehydration  - No further blood work, focus on comfort care   Acute metabolic encephalopathy / Dementia without behavioral disturbance / Depression and anxiety  - CT head without acute intracranial findings  - Altered mental status likely secondary to combination of dementia as well as sepsis  - No significant changes in mental status   Atrial fibrillation - Seen on initial telemetry in ED but because of hypotension limited use of beta blockers or calcium channel blockers   Hypokalemia - Due to sepsis - Supplemented   Dyslipidemia - Not on statin due to transaminitis    DVT prophylaxis: SCD;s Code Status: DNR/DNI  Family Communication: spoke with family 11/28 over the phone, gave an update     Consultants:   PCCM  Surgery  Palliative care  Procedures:   NG tube place 11/218  Antimicrobials:   Zosyn 16-Jun-2023 --> 11/28      Time: 8:45 am  Signed:  Dedra Skeens  Charlies Silvers  [facility] June 24, 2017, 8:51 AM

## 2017-05-29 DEATH — deceased

## 2019-02-28 IMAGING — CR DG ELBOW COMPLETE 3+V*R*
4 series · 4 of 4 positions shown · non-contrast
Comparison: None.

CLINICAL DATA: Right elbow injury after unwitnessed fall.

EXAM:
RIGHT ELBOW - COMPLETE 3+ VIEW

[x elbow ap right]
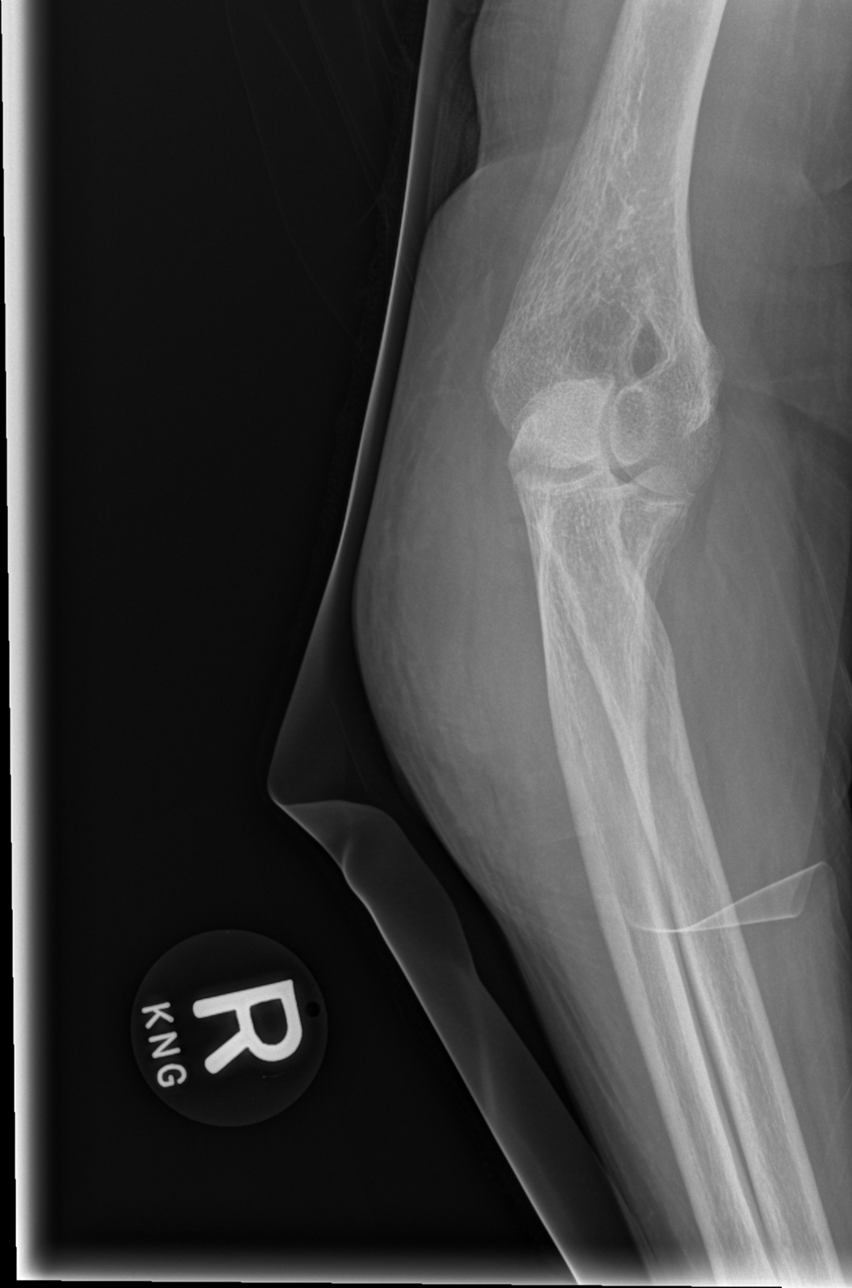

[x elbow obl right (1 of 2)]
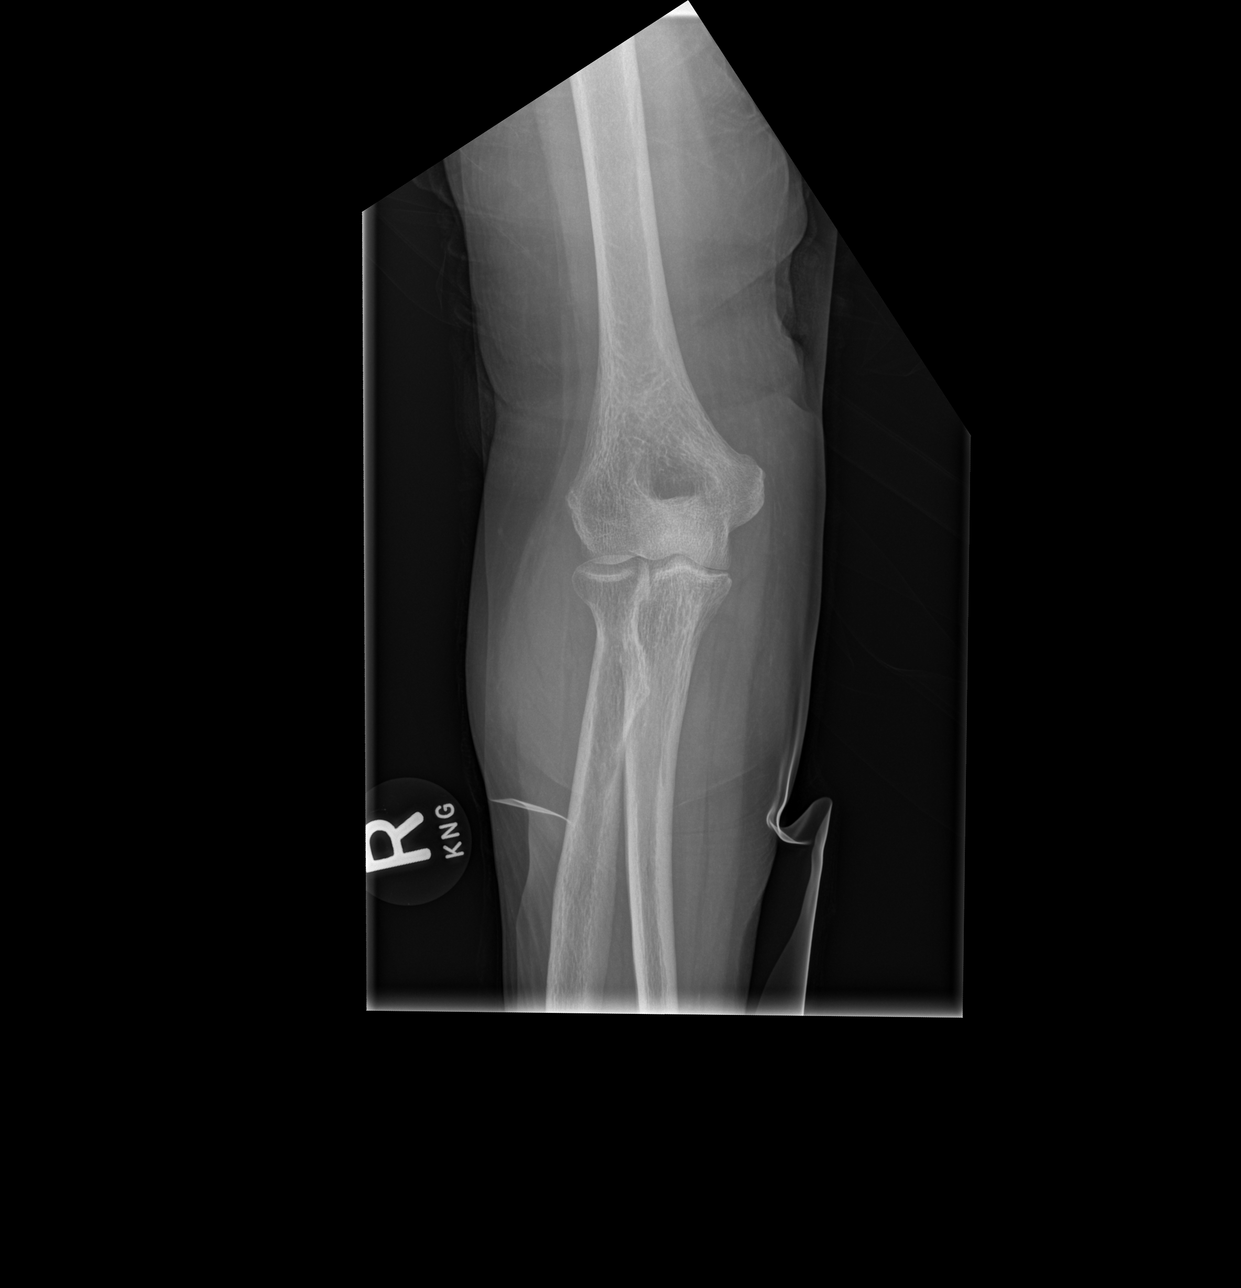

[x elbow obl right (2 of 2)]
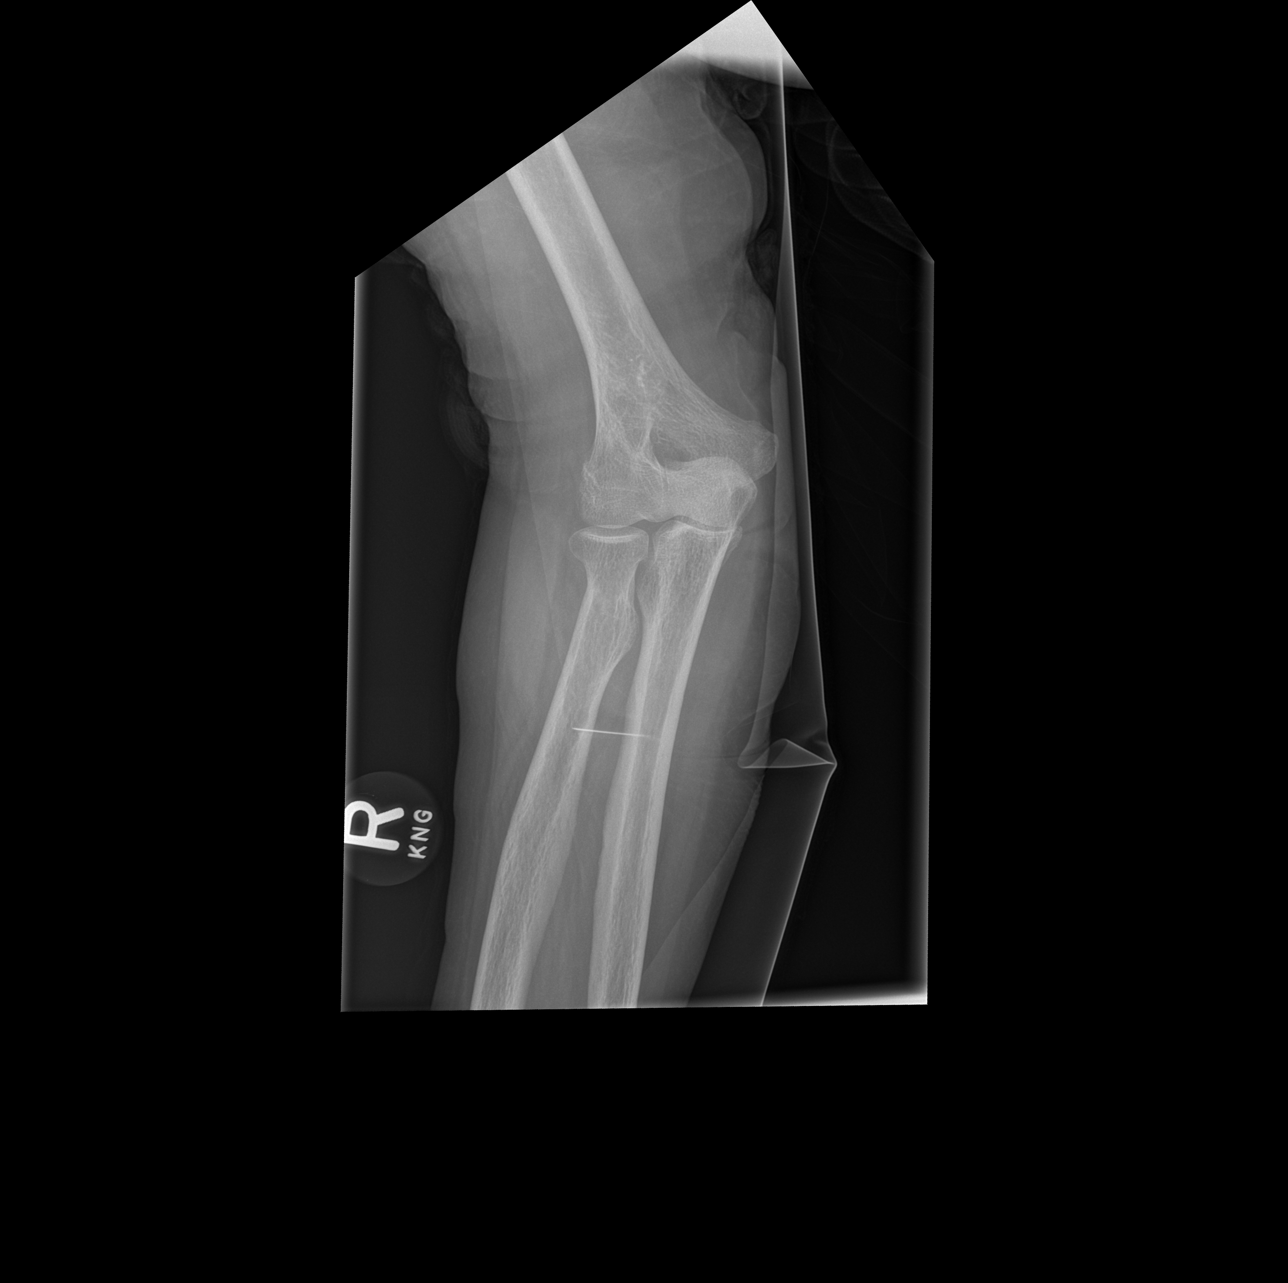

[x elbow lat right]
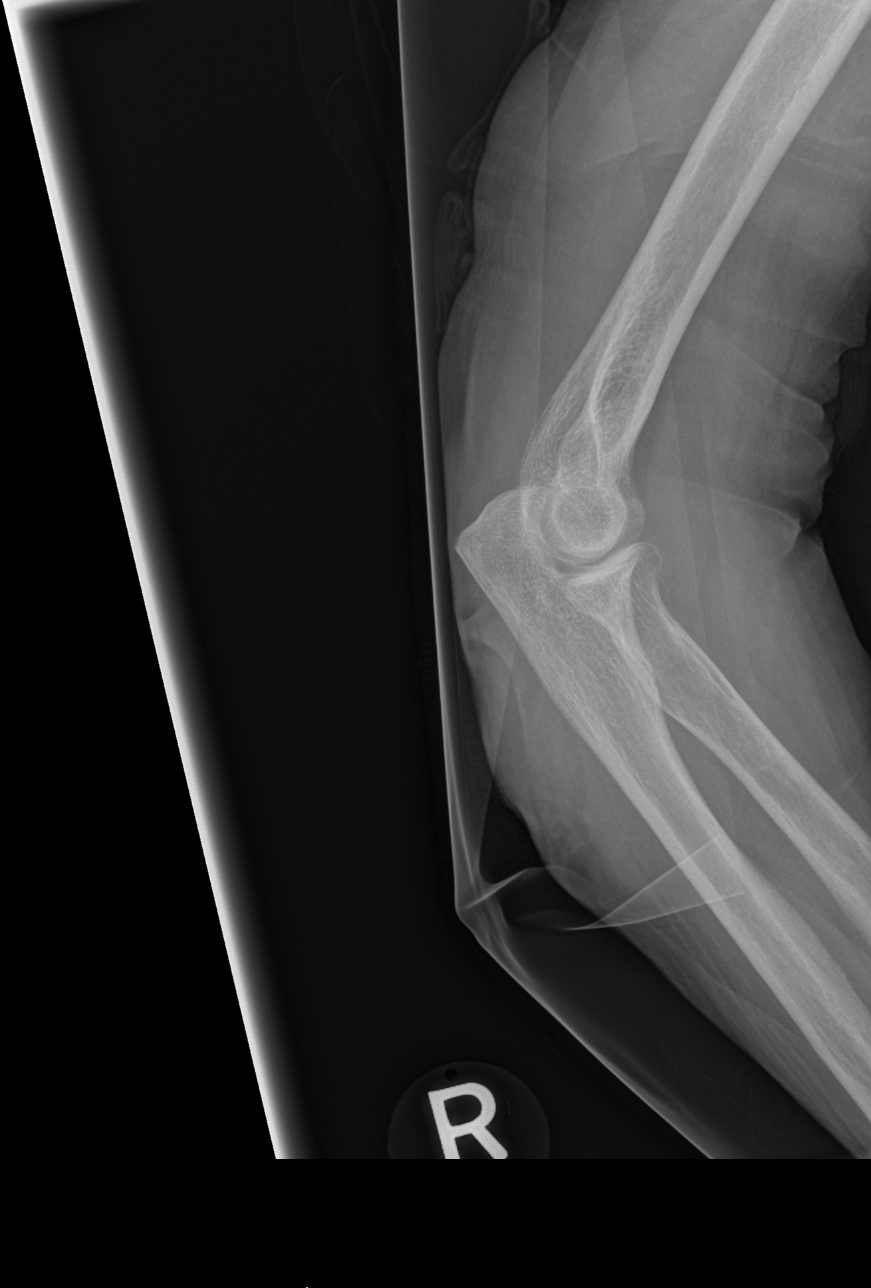

[4 of 4 positions shown; findings below may reference images not displayed]

FINDINGS: Diffuse bone demineralization. No acute fracture or dislocation is
identified. No significant effusion. No focal bone lesion or bone
destruction. Soft tissue swelling over the posterior olecranon
likely represents a hematoma.
IMPRESSION: Soft tissue hematoma posterior to the olecranon. No evidence of
acute fracture or dislocation.

## 2019-03-01 IMAGING — CT CT CERVICAL SPINE W/O CM
4 of 8 series · 12 of 33 positions shown, 13 images · non-contrast
Comparison: None.

CLINICAL DATA: 89-year-old female with unwitnessed fall.

EXAM:
CT HEAD WITHOUT CONTRAST
CT CERVICAL SPINE WITHOUT CONTRAST
TECHNIQUE: Multidetector CT imaging of the head and cervical spine was
performed following the standard protocol without intravenous
contrast. Multiplanar CT image reconstructions of the cervical spine
were also generated.

[Series 5: coronal · coronal · 0.27mm/px · 1 of 60 slices shown]
[im 30/60  bone]
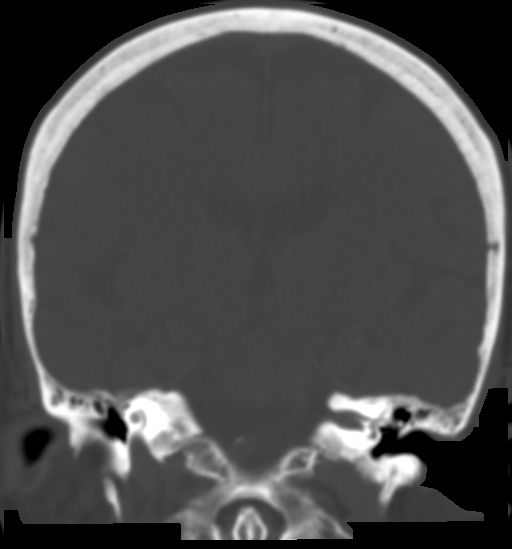

[Series 7: c-spine st · axial · 0.34mm/px · z∈[-350,-264]mm · 3 of 87 slices shown, 4 images]
[im 22/87  soft-tissue]
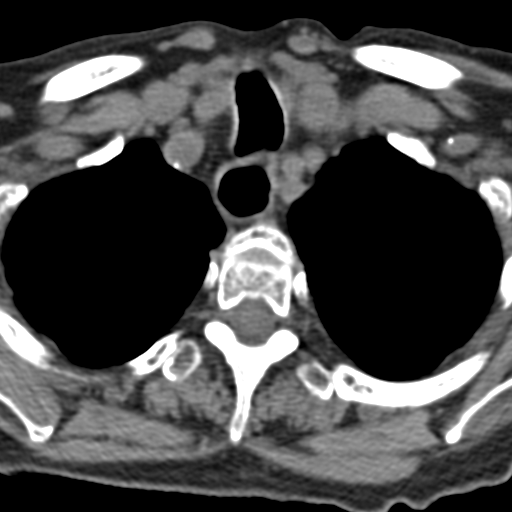
[im 22/87  bone]
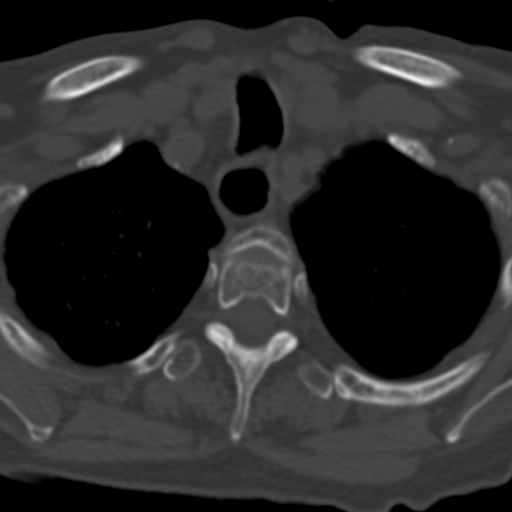
[im 44/87  bone]
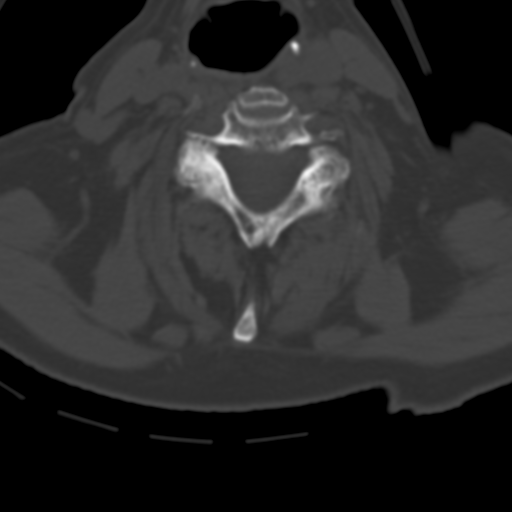
[im 65/87  bone]
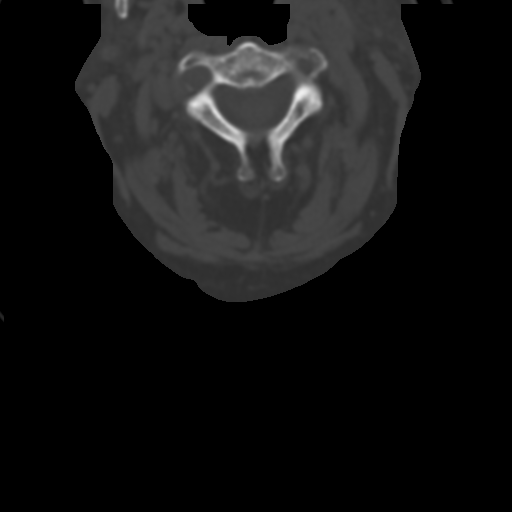

[Series 9: axial recon · axial · 0.23mm/px · z∈[-375,-293]mm · 3 of 92 slices shown]
[im 23/92  bone]
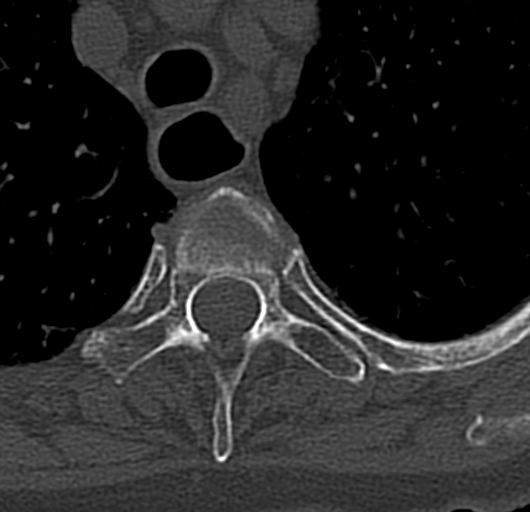
[im 46/92  bone]
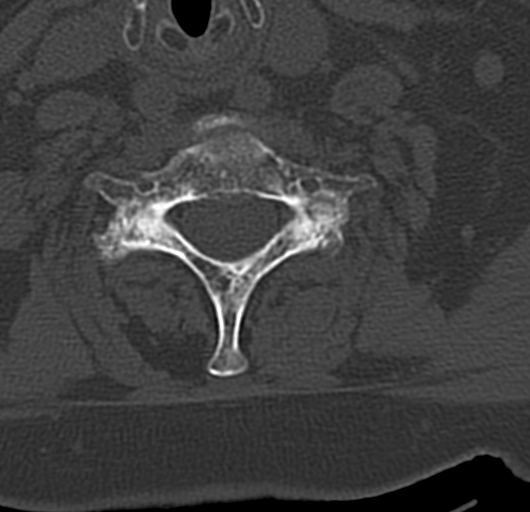
[im 69/92  bone]
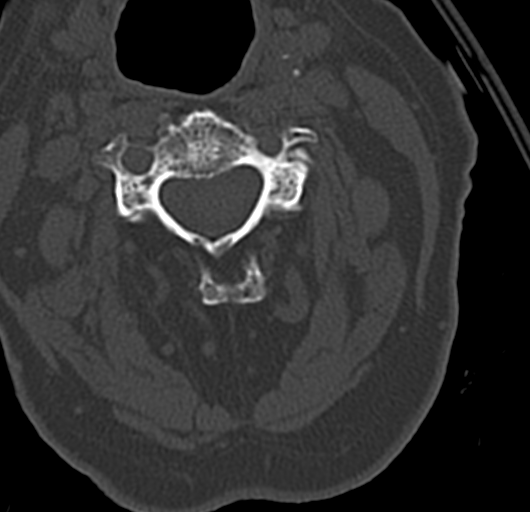

[Series 11: sagittal · sagittal · 0.22mm/px · 5 of 49 slices shown]
[im 9/49  bone]
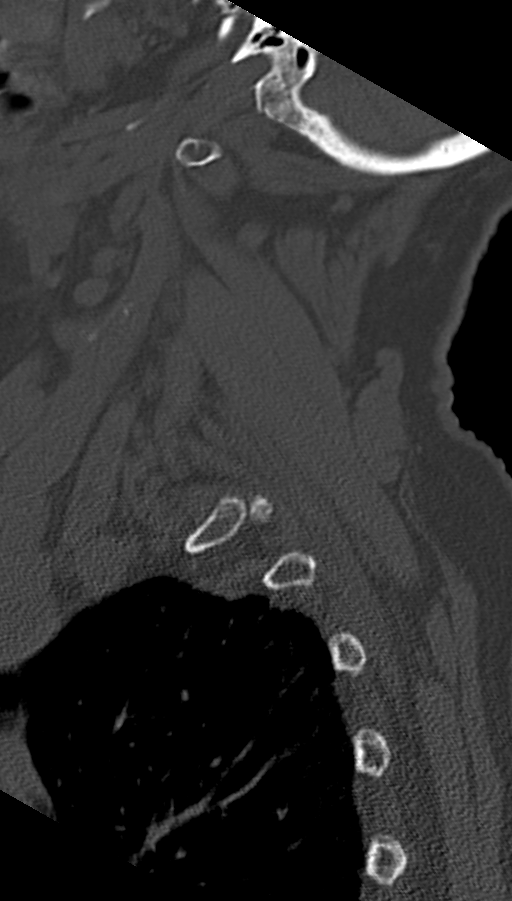
[im 17/49  bone]
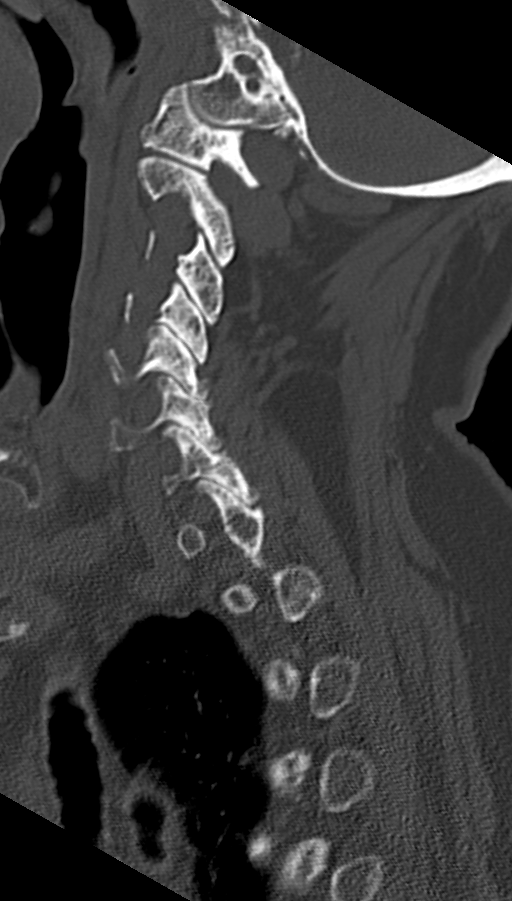
[im 25/49  bone]
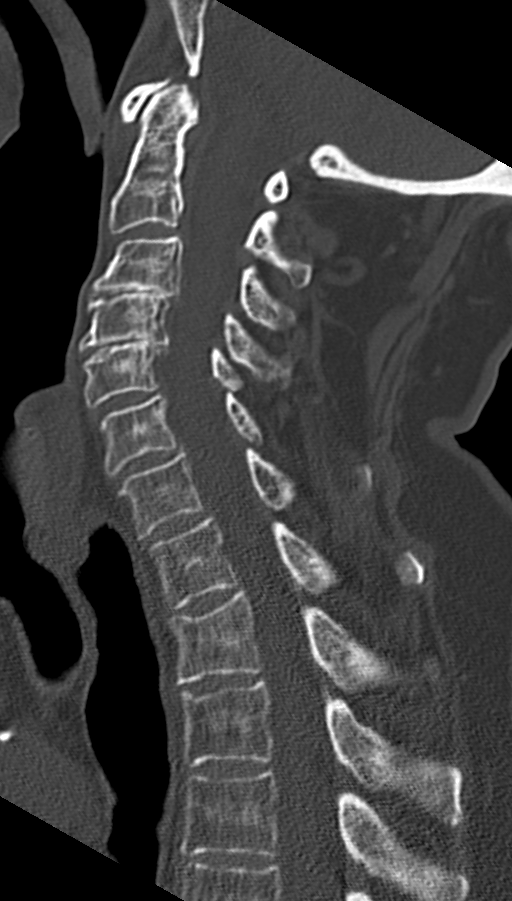
[im 33/49  bone]
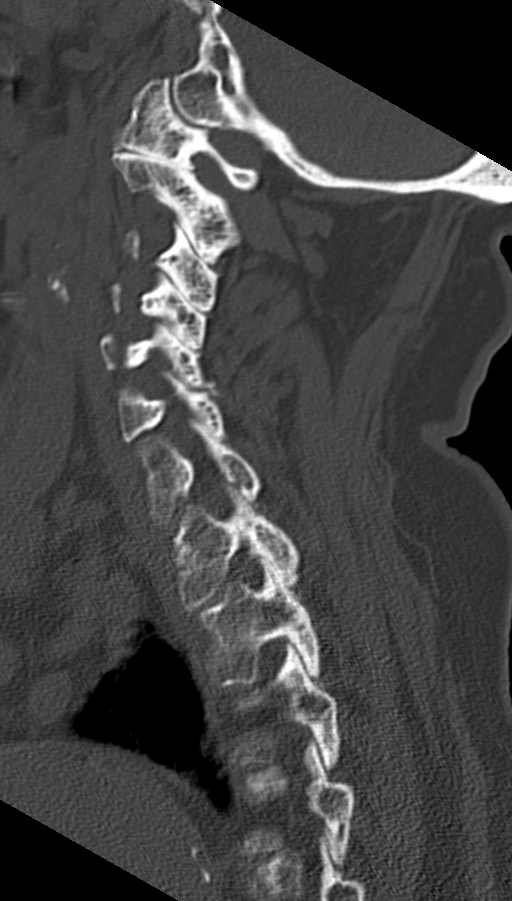
[im 41/49  bone]
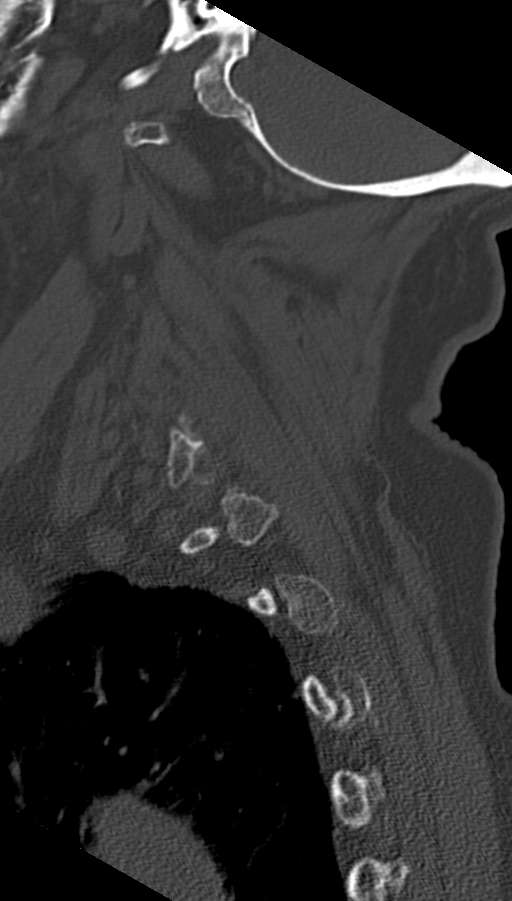

[12 of 33 positions shown; findings below may reference images not displayed]

FINDINGS: CT HEAD FINDINGS

Brain: There is moderate age-related atrophy and chronic
microvascular ischemic changes. There is no acute intracranial
hemorrhage. No mass effect or midline shift. No extra-axial fluid
collection.

Vascular: No hyperdense vessel or unexpected calcification.

Skull: Normal. Negative for fracture or focal lesion.

Sinuses/Orbits: The visualized paranasal sinuses are clear. There is
opacification of multiple mastoid air cells bilaterally. Clinical
correlation is recommended.

Other: None

CT CERVICAL SPINE FINDINGS

Alignment: No acute subluxation.

Skull base and vertebrae: Osteopenia.  No acute fracture.

Soft tissues and spinal canal: No prevertebral fluid or swelling. No
visible canal hematoma.

Disc levels: Degenerative changes and endplate irregularity and disc
space narrowing primarily at C3-C4 and C4-C5.

Upper chest: Negative.

Other: There are atherosclerotic calcification of the aortic arch.
IMPRESSION: 1. No acute intracranial hemorrhage. Age-related atrophy and chronic
microvascular ischemic changes.
2. No acute/traumatic cervical spine pathology. Degenerative
changes.
